# Patient Record
Sex: Male | Born: 1945 | Race: White | Hispanic: No | Marital: Married | Smoking: Former smoker
Health system: Southern US, Community
[De-identification: ages and names within clinical notes are randomized; demographics above are authoritative.]

## PROBLEM LIST (undated history)

## (undated) DIAGNOSIS — N184 Chronic kidney disease, stage 4 (severe): Secondary | ICD-10-CM

## (undated) DIAGNOSIS — G629 Polyneuropathy, unspecified: Secondary | ICD-10-CM

## (undated) DIAGNOSIS — E119 Type 2 diabetes mellitus without complications: Secondary | ICD-10-CM

## (undated) DIAGNOSIS — K2970 Gastritis, unspecified, without bleeding: Secondary | ICD-10-CM

## (undated) DIAGNOSIS — I4891 Unspecified atrial fibrillation: Secondary | ICD-10-CM

## (undated) DIAGNOSIS — N289 Disorder of kidney and ureter, unspecified: Secondary | ICD-10-CM

## (undated) DIAGNOSIS — I10 Essential (primary) hypertension: Secondary | ICD-10-CM

## (undated) DIAGNOSIS — I639 Cerebral infarction, unspecified: Secondary | ICD-10-CM

## (undated) HISTORY — PX: CHOLECYSTECTOMY: SHX55

---

## 2011-12-22 DIAGNOSIS — E1122 Type 2 diabetes mellitus with diabetic chronic kidney disease: Secondary | ICD-10-CM

## 2011-12-22 DIAGNOSIS — I10 Essential (primary) hypertension: Secondary | ICD-10-CM | POA: Diagnosis present

## 2012-10-17 ENCOUNTER — Emergency Department: Payer: Self-pay | Admitting: Emergency Medicine

## 2012-10-17 LAB — COMPREHENSIVE METABOLIC PANEL
Albumin: 3.5 g/dL (ref 3.4–5.0)
BUN: 36 mg/dL — ABNORMAL HIGH (ref 7–18)
Bilirubin,Total: 0.9 mg/dL (ref 0.2–1.0)
Creatinine: 2.17 mg/dL — ABNORMAL HIGH (ref 0.60–1.30)
EGFR (African American): 35 — ABNORMAL LOW
EGFR (Non-African Amer.): 30 — ABNORMAL LOW
Potassium: 4.2 mmol/L (ref 3.5–5.1)
SGPT (ALT): 22 U/L (ref 12–78)
Sodium: 135 mmol/L — ABNORMAL LOW (ref 136–145)
Total Protein: 7.3 g/dL (ref 6.4–8.2)

## 2012-10-17 LAB — URINALYSIS, COMPLETE
Bacteria: NONE SEEN
Glucose,UR: NEGATIVE mg/dL (ref 0–75)
Nitrite: NEGATIVE
Protein: 100
RBC,UR: <1 /HPF (ref 0–5)
Specific Gravity: 1.01 (ref 1.003–1.030)
WBC UR: <1 /HPF (ref 0–5)

## 2012-10-17 LAB — CBC
HCT: 37.7 % — ABNORMAL LOW (ref 40.0–52.0)
MCHC: 34.5 g/dL (ref 32.0–36.0)
MCV: 87 fL (ref 80–100)
Platelet: 163 10*3/uL (ref 150–440)
RBC: 4.36 10*6/uL — ABNORMAL LOW (ref 4.40–5.90)

## 2012-10-17 LAB — LIPASE, BLOOD: Lipase: 130 U/L (ref 73–393)

## 2012-10-28 ENCOUNTER — Ambulatory Visit: Payer: Self-pay | Admitting: Surgery

## 2012-10-29 ENCOUNTER — Emergency Department: Payer: Self-pay | Admitting: Emergency Medicine

## 2012-10-29 LAB — URINALYSIS, COMPLETE
Bacteria: NONE SEEN
Glucose,UR: 50 mg/dL (ref 0–75)
Ketone: NEGATIVE
Nitrite: NEGATIVE
Ph: 5 (ref 4.5–8.0)
Protein: 30
Specific Gravity: 1.008 (ref 1.003–1.030)
Squamous Epithelial: NONE SEEN

## 2012-11-01 LAB — PATHOLOGY REPORT

## 2012-11-17 ENCOUNTER — Ambulatory Visit: Payer: Self-pay | Admitting: Gastroenterology

## 2012-11-21 LAB — PATHOLOGY REPORT

## 2012-12-06 DIAGNOSIS — K2970 Gastritis, unspecified, without bleeding: Secondary | ICD-10-CM | POA: Diagnosis present

## 2013-02-02 ENCOUNTER — Ambulatory Visit: Payer: Self-pay | Admitting: Gastroenterology

## 2013-02-06 LAB — PATHOLOGY REPORT

## 2013-10-18 DIAGNOSIS — G4733 Obstructive sleep apnea (adult) (pediatric): Secondary | ICD-10-CM | POA: Diagnosis present

## 2014-07-09 ENCOUNTER — Ambulatory Visit
Admit: 2014-07-09 | Disposition: A | Discharge status: Home / Self Care | Payer: Self-pay | Attending: Internal Medicine | Admitting: Internal Medicine

## 2014-07-27 NOTE — Op Note (Signed)
PATIENT NAME:  Tyler Wiley, Tyler Wiley MR#:  [id number] DATE OF BIRTH:  1945-04-20  DATE OF PROCEDURE:  10/28/2012  PREOPERATIVE DIAGNOSES: Chronic cholecystitis, cholelithiasis, umbilical hernia.   POSTOPERATIVE DIAGNOSES: Chronic cholecystitis, cholelithiasis, umbilical hernia.   PROCEDURES: Laparoscopic cholecystectomy, umbilical hernia repair.   SURGEON: Loreli Dollar, M.D.   ANESTHESIA: General.   INDICATIONS: This 69 year old male has a history of epigastric pains, ultrasound findings of gallstones, also physical findings of an umbilical hernia which created an approximately 3 cm bulge and was partially reducible. Surgery was recommended for definitive treatment.   DESCRIPTION OF PROCEDURE: The patient was placed on the operating table in the supine position under general endotracheal anesthesia. Clippers were used, and the abdomen was prepared with ChloraPrep and draped in a sterile manner.   Initial incision was made as a supraumbilical, transversely-oriented, curvilinear incision which was carried down through the subcutaneous tissues to encounter an umbilical hernia sac. The sac was dissected free from surrounding structures and was separated from the fascial ring defect and was then inverted. Next, the fascial ring defect was grasped with a laryngeal hook and elevated. A Veress needle was inserted. Then, the needle was withdrawn, and the 10 mm port was advanced down through the sheath. The abdomen was insufflated with carbon dioxide. As the laparoscope was inserted to view the peritoneal cavity, it was noted that there was some leakage of carbon dioxide. I, therefore, removed the scope and placed two 0 Maxon simple sutures to narrow the fascial ring defect, and subsequently, the procedure was continued with the laparoscopy and had an airtight seal. The liver appeared normal. The patient was moderately obese. There was a large amount of omentum. Another incision was made in the epigastrium just  slightly to the right of the midline to introduce an 11 mm cannula. Two incisions were made in the lateral aspect of the right upper quadrant to introduce two 5 mm cannulas.   The gallbladder was retracted towards the right shoulder. There was a moderate amount of fatty deposits surrounding the gallbladder neck. An incision was made in these fatty deposits and identified the pouch of Morison which was retracted inferiorly and laterally. The gallbladder neck was further mobilized with incision of the visceral peritoneum. The infundibulum was followed down to the cystic duct. The neck of the gallbladder was further mobilized. There was a branch of the cystic artery which was controlled with hemoclips and divided. The location of the porta hepatis was demonstrated. Next, an Endo Clip was placed across the cystic duct adjacent to the neck of the gallbladder. An incision was made in the cystic duct to introduce a Reddick catheter; however, the Reddick catheter would only thread in approximately 8 mm and, therefore, did not do a cholangiogram. I did milk the cystic duct to see if there were any stones in it but did not identify any stones. Subsequently, the cystic duct was doubly ligated with Endo Clips and divided. Further dissection was carried out identifying the main trunk of the cystic artery which was controlled with double Endo Clips and divided. The gallbladder was dissected free from the liver with hook and cautery. This was a somewhat tedious dissection due to the large size of the gallbladder and completely was separated. The site was irrigated with heparinized saline solution and aspirated. Hemostasis was intact. The gallbladder was delivered out through the umbilical incision. The two sutures placed in the fascia were cut. The gallbladder was opened and suctioned. Next, I noted there was a  large amount of stones in the gallbladder. These were removed with the stone scoop and also with stone forceps. This  was a somewhat tedious undertaking removing stones which lasted some additional 10 to 15 minutes removing all of the stones, and subsequently, the gallbladder could be removed and was submitted in formalin with stones for routine pathology. The right upper quadrant was further inspected. Hemostasis was intact. The cannulas were removed, allowing carbon dioxide to escape from the peritoneal cavity.   Next, the umbilical hernia repair was carried out by inverting the sac which had previously been dissected, and the fascial ring defect was closed with a transversely-oriented suture line of interrupted 0 Maxon figure-of-eight sutures. The skin of the umbilicus was sutured to the deep fascia with 3-0 chromic. The skin at this site was closed with 3-0 chromic subcuticular sutures, the epigastric incision closed with 3-0 chromic subcuticular sutures and the two 5 mm port sites closed with 5-0 chromic subcuticular sutures. All wounds were then dressed with benzoin and Steri-Strips. Gauze was rolled up into a ball to place into the navel, and all dressings were then covered with gauze and 2 inch paper tape.   The patient tolerated the procedure satisfactorily and was then prepared for transfer to the recovery room.    ____________________________ Lenna Sciara. Rochel Brome, MD jws:gb D: 10/28/2012 15:27:52 ET T: 10/28/2012 22:22:41 ET JOB#: [id number]  cc: Loreli Dollar, MD, <Dictator> Loreli Dollar MD ELECTRONICALLY SIGNED 10/29/2012 13:19

## 2015-08-28 ENCOUNTER — Other Ambulatory Visit: Payer: Self-pay | Admitting: Internal Medicine

## 2015-08-28 DIAGNOSIS — N289 Disorder of kidney and ureter, unspecified: Secondary | ICD-10-CM

## 2015-08-30 ENCOUNTER — Ambulatory Visit
Admission: RE | Admit: 2015-08-30 | Discharge: 2015-08-30 | Disposition: A | Discharge status: Home / Self Care | Payer: Medicare Other | Source: Ambulatory Visit | Attending: Internal Medicine | Admitting: Internal Medicine

## 2015-08-30 DIAGNOSIS — N289 Disorder of kidney and ureter, unspecified: Secondary | ICD-10-CM | POA: Diagnosis present

## 2015-08-30 DIAGNOSIS — N281 Cyst of kidney, acquired: Secondary | ICD-10-CM | POA: Insufficient documentation

## 2018-02-14 ENCOUNTER — Emergency Department: Payer: Medicare Other

## 2018-02-14 ENCOUNTER — Inpatient Hospital Stay
Admission: EM | Admit: 2018-02-14 | Discharge: 2018-02-15 | DRG: 641 | Disposition: A | Discharge status: Home / Self Care | Payer: Medicare Other | Attending: Internal Medicine | Admitting: Internal Medicine

## 2018-02-14 ENCOUNTER — Other Ambulatory Visit: Payer: Self-pay

## 2018-02-14 ENCOUNTER — Other Ambulatory Visit
Admission: RE | Admit: 2018-02-14 | Discharge: 2018-02-14 | Disposition: A | Discharge status: Home / Self Care | Payer: Medicare Other | Source: Ambulatory Visit | Attending: Physician Assistant | Admitting: Physician Assistant

## 2018-02-14 ENCOUNTER — Encounter: Payer: Self-pay | Admitting: Emergency Medicine

## 2018-02-14 DIAGNOSIS — D631 Anemia in chronic kidney disease: Secondary | ICD-10-CM | POA: Diagnosis present

## 2018-02-14 DIAGNOSIS — Z8673 Personal history of transient ischemic attack (TIA), and cerebral infarction without residual deficits: Secondary | ICD-10-CM | POA: Diagnosis not present

## 2018-02-14 DIAGNOSIS — Z7984 Long term (current) use of oral hypoglycemic drugs: Secondary | ICD-10-CM | POA: Diagnosis not present

## 2018-02-14 DIAGNOSIS — E875 Hyperkalemia: Principal | ICD-10-CM | POA: Diagnosis present

## 2018-02-14 DIAGNOSIS — Z7902 Long term (current) use of antithrombotics/antiplatelets: Secondary | ICD-10-CM

## 2018-02-14 DIAGNOSIS — Z87891 Personal history of nicotine dependence: Secondary | ICD-10-CM | POA: Diagnosis not present

## 2018-02-14 DIAGNOSIS — Z79899 Other long term (current) drug therapy: Secondary | ICD-10-CM | POA: Diagnosis not present

## 2018-02-14 DIAGNOSIS — Z8249 Family history of ischemic heart disease and other diseases of the circulatory system: Secondary | ICD-10-CM | POA: Diagnosis not present

## 2018-02-14 DIAGNOSIS — N179 Acute kidney failure, unspecified: Secondary | ICD-10-CM | POA: Diagnosis present

## 2018-02-14 DIAGNOSIS — Z794 Long term (current) use of insulin: Secondary | ICD-10-CM | POA: Diagnosis not present

## 2018-02-14 DIAGNOSIS — N189 Chronic kidney disease, unspecified: Secondary | ICD-10-CM

## 2018-02-14 DIAGNOSIS — Z9049 Acquired absence of other specified parts of digestive tract: Secondary | ICD-10-CM | POA: Diagnosis not present

## 2018-02-14 DIAGNOSIS — I129 Hypertensive chronic kidney disease with stage 1 through stage 4 chronic kidney disease, or unspecified chronic kidney disease: Secondary | ICD-10-CM | POA: Diagnosis present

## 2018-02-14 DIAGNOSIS — R0602 Shortness of breath: Secondary | ICD-10-CM | POA: Insufficient documentation

## 2018-02-14 DIAGNOSIS — N3289 Other specified disorders of bladder: Secondary | ICD-10-CM | POA: Diagnosis present

## 2018-02-14 DIAGNOSIS — N289 Disorder of kidney and ureter, unspecified: Secondary | ICD-10-CM

## 2018-02-14 DIAGNOSIS — E1122 Type 2 diabetes mellitus with diabetic chronic kidney disease: Secondary | ICD-10-CM | POA: Diagnosis present

## 2018-02-14 DIAGNOSIS — N184 Chronic kidney disease, stage 4 (severe): Secondary | ICD-10-CM | POA: Diagnosis present

## 2018-02-14 DIAGNOSIS — R001 Bradycardia, unspecified: Secondary | ICD-10-CM

## 2018-02-14 DIAGNOSIS — Z7951 Long term (current) use of inhaled steroids: Secondary | ICD-10-CM

## 2018-02-14 DIAGNOSIS — R197 Diarrhea, unspecified: Secondary | ICD-10-CM | POA: Diagnosis not present

## 2018-02-14 DIAGNOSIS — N2581 Secondary hyperparathyroidism of renal origin: Secondary | ICD-10-CM | POA: Diagnosis not present

## 2018-02-14 HISTORY — DX: Type 2 diabetes mellitus without complications: E11.9

## 2018-02-14 HISTORY — DX: Essential (primary) hypertension: I10

## 2018-02-14 HISTORY — DX: Disorder of kidney and ureter, unspecified: N28.9

## 2018-02-14 HISTORY — DX: Cerebral infarction, unspecified: I63.9

## 2018-02-14 HISTORY — DX: Gastritis, unspecified, without bleeding: K29.70

## 2018-02-14 LAB — CBC
HCT: 36.4 % — ABNORMAL LOW (ref 39.0–52.0)
Hemoglobin: 12.1 g/dL — ABNORMAL LOW (ref 13.0–17.0)
MCH: 29.6 pg (ref 26.0–34.0)
MCHC: 33.2 g/dL (ref 30.0–36.0)
MCV: 89 fL (ref 80.0–100.0)
NRBC: 0 % (ref 0.0–0.2)
PLATELETS: 164 10*3/uL (ref 150–400)
RBC: 4.09 MIL/uL — ABNORMAL LOW (ref 4.22–5.81)
RDW: 13.3 % (ref 11.5–15.5)
WBC: 9.1 10*3/uL (ref 4.0–10.5)

## 2018-02-14 LAB — URINALYSIS, COMPLETE (UACMP) WITH MICROSCOPIC
BACTERIA UA: NONE SEEN
Bilirubin Urine: NEGATIVE
Glucose, UA: 150 mg/dL — AB
Hgb urine dipstick: NEGATIVE
Ketones, ur: NEGATIVE mg/dL
Leukocytes, UA: NEGATIVE
NITRITE: NEGATIVE
Specific Gravity, Urine: 1.013 (ref 1.005–1.030)
pH: 6 (ref 5.0–8.0)

## 2018-02-14 LAB — TROPONIN I
TROPONIN I: 0.04 ng/mL — AB (ref <0.03)
Troponin I: 0.16 ng/mL (ref <0.03)

## 2018-02-14 LAB — BASIC METABOLIC PANEL
ANION GAP: 8 (ref 5–15)
BUN: 35 mg/dL — AB (ref 8–23)
CALCIUM: 8.6 mg/dL — AB (ref 8.9–10.3)
CHLORIDE: 103 mmol/L (ref 98–111)
CO2: 24 mmol/L (ref 22–32)
CREATININE: 3.97 mg/dL — AB (ref 0.61–1.24)
GFR calc Af Amer: 16 mL/min — ABNORMAL LOW (ref >60)
GFR, EST NON AFRICAN AMERICAN: 14 mL/min — AB (ref >60)
GLUCOSE: 234 mg/dL — AB (ref 70–99)
POTASSIUM: 6 mmol/L — AB (ref 3.5–5.1)
Sodium: 135 mmol/L (ref 135–145)

## 2018-02-14 LAB — GLUCOSE, CAPILLARY: GLUCOSE-CAPILLARY: 119 mg/dL — AB (ref 70–99)

## 2018-02-14 MED ORDER — LINAGLIPTIN 5 MG PO TABS
5.0000 mg | ORAL_TABLET | Freq: Every day | ORAL | Status: DC
  Administered 2018-02-15: 5 mg via ORAL
  Filled 2018-02-14: qty 1

## 2018-02-14 MED ORDER — HEPARIN SODIUM (PORCINE) 5000 UNIT/ML IJ SOLN
5000.0000 [IU] | Freq: Three times a day (TID) | INTRAMUSCULAR | Status: DC
  Administered 2018-02-14 – 2018-02-15 (×2): 5000 [IU] via SUBCUTANEOUS
  Filled 2018-02-14 (×2): qty 1

## 2018-02-14 MED ORDER — AMLODIPINE BESYLATE 10 MG PO TABS
10.0000 mg | ORAL_TABLET | Freq: Every day | ORAL | Status: DC
  Administered 2018-02-14 – 2018-02-15 (×2): 10 mg via ORAL
  Filled 2018-02-14 (×2): qty 1

## 2018-02-14 MED ORDER — CALCIUM GLUCONATE-NACL 1-0.675 GM/50ML-% IV SOLN
1.0000 g | Freq: Once | INTRAVENOUS | Status: AC
  Administered 2018-02-14: 1000 mg via INTRAVENOUS
  Filled 2018-02-14: qty 50

## 2018-02-14 MED ORDER — DOCUSATE SODIUM 100 MG PO CAPS: 100.0000 mg | ORAL_CAPSULE | Freq: Two times a day (BID) | ORAL | Status: DC | PRN

## 2018-02-14 MED ORDER — CLOPIDOGREL BISULFATE 75 MG PO TABS
75.0000 mg | ORAL_TABLET | Freq: Every day | ORAL | Status: DC
Start: 2018-02-14 — End: 2018-02-15
  Administered 2018-02-14: 75 mg via ORAL
  Filled 2018-02-14: qty 1

## 2018-02-14 MED ORDER — PATIROMER SORBITEX CALCIUM 8.4 G PO PACK
16.8000 g | PACK | Freq: Every day | ORAL | Status: DC
  Administered 2018-02-14: 16.8 g via ORAL
  Filled 2018-02-14: qty 2

## 2018-02-14 MED ORDER — SODIUM CHLORIDE 0.9 % IV SOLN
INTRAVENOUS | Status: DC
  Administered 2018-02-14 (×2): via INTRAVENOUS

## 2018-02-14 MED ORDER — SODIUM CHLORIDE 0.9 % IV BOLUS
1000.0000 mL | Freq: Once | INTRAVENOUS | Status: AC
Start: 2018-02-14 — End: 2018-02-14
  Administered 2018-02-14: 1000 mL via INTRAVENOUS

## 2018-02-14 MED ORDER — HYDRALAZINE HCL 20 MG/ML IJ SOLN: 10.0000 mg | Freq: Four times a day (QID) | INTRAMUSCULAR | Status: DC | PRN

## 2018-02-14 MED ORDER — SODIUM ZIRCONIUM CYCLOSILICATE 10 G PO PACK
10.0000 g | PACK | Freq: Two times a day (BID) | ORAL | Status: DC
  Administered 2018-02-14 – 2018-02-15 (×2): 10 g via ORAL
  Filled 2018-02-14 (×3): qty 1

## 2018-02-14 MED ORDER — GLIMEPIRIDE 2 MG PO TABS
2.0000 mg | ORAL_TABLET | Freq: Every day | ORAL | Status: DC
  Administered 2018-02-15: 2 mg via ORAL
  Filled 2018-02-14: qty 1

## 2018-02-14 MED ORDER — INSULIN ASPART 100 UNIT/ML ~~LOC~~ SOLN: 0.0000 [IU] | Freq: Three times a day (TID) | SUBCUTANEOUS | Status: DC

## 2018-02-14 MED ORDER — FLUTICASONE PROPIONATE 50 MCG/ACT NA SUSP
2.0000 | Freq: Every day | NASAL | Status: DC | PRN
  Filled 2018-02-14: qty 16

## 2018-02-14 MED ORDER — TORSEMIDE 20 MG PO TABS
10.0000 mg | ORAL_TABLET | Freq: Every day | ORAL | Status: DC
Start: 2018-02-14 — End: 2018-02-15
  Administered 2018-02-15: 10 mg via ORAL
  Filled 2018-02-14: qty 1

## 2018-02-14 NOTE — Progress Notes (Signed)
Family Meeting Note  Advance Directive:yes  Today a meeting took place with the Patient and spouse.  The following clinical team members were present during this meeting:MD  The following were discussed:Patient's diagnosis: Chronic kidney disease stage IV, hypertension, diabetes, history of stroke, Patient's progosis: Unable to determine and Goals for treatment: Full Code  Additional follow-up to be provided: Nephrology.  Time spent during discussion:20 minutes  Tyler Basta, MD

## 2018-02-14 NOTE — ED Notes (Signed)
Pt states this AM he was doing routine work and became short of breath without exerstion. Pt denies and chest pain with SOB and states he felt lightheaded as well. Pt denies any pain or Sob at this time.

## 2018-02-14 NOTE — ED Provider Notes (Signed)
[facility] Emergency Department Provider Note  Time seen: 4:49 PM  I have reviewed the triage vital signs and the nursing notes.   HISTORY  Chief Complaint Shortness of Breath    HPI Tyler Wiley is a 72 y.o. male with a past medical history of diabetes, gastritis, hypertension, CVA, presents to the emergency department for shortness of breath.  According to the patient since around 8:00 this morning he has been feeling somewhat short of breath, went to the walk-in clinic had lab work performed and was referred for a stress test next week.  Patient states he was called back today saying that his heart enzymes were elevated and that he needed to go to the emergency department.  Patient denies any chest pain, states his shortness of breath has improved and is now very minimal.  Denies any cough or congestion.  Largely negative review of systems.   Past Medical History:  Diagnosis Date  . Diabetes mellitus without complication ([place])   . Gastritis   . Hypertension   . Renal disorder   . Stroke [facility])     There are no active problems to display for this patient.   Past Surgical History:  Procedure Laterality Date  . CHOLECYSTECTOMY      Prior to Admission medications   Not on File    No Known Allergies  No family history on file.  Social History Social History   Tobacco Use  . Smoking status: Former Research scientist (life sciences)  . Smokeless tobacco: Never Used  Substance Use Topics  . Alcohol use: Yes    Comment: Social   . Drug use: Never    Review of Systems Constitutional: Negative for fever. Cardiovascular: Negative for chest pain. Respiratory: Mild shortness of breath since 8 AM this morning. Gastrointestinal: Negative for abdominal pain, vomiting.  Negative for diarrhea. Genitourinary: Negative for urinary compaints Musculoskeletal: Negative for leg pain or swelling. Skin: Negative for skin complaints  Neurological: Negative for headache All other ROS  negative  ____________________________________________   PHYSICAL EXAM:  Constitutional: Alert and oriented. Well appearing and in no distress. Eyes: Normal exam ENT   Head: Normocephalic and atraumatic.   Mouth/Throat: Mucous membranes are moist. Cardiovascular: Normal rate, regular rhythm. No murmur Respiratory: Normal respiratory effort without tachypnea nor retractions. Breath sounds are clear  Gastrointestinal: Soft and nontender. No distention.  Musculoskeletal: Nontender with normal range of motion in all extremities.  Neurologic:  Normal speech and language. No gross focal neurologic deficits  Skin:  Skin is warm, dry and intact.  Psychiatric: Mood and affect are normal.   ____________________________________________    EKG  EKG reviewed and interpreted by myself shows sinus bradycardia 53 bpm with a narrow QRS, normal axis, normal intervals, no concerning ST changes.  ____________________________________________    RADIOLOGY  Chest x-ray shows minimal atelectasis  ____________________________________________   INITIAL IMPRESSION / ASSESSMENT AND PLAN / ED COURSE  Pertinent labs & imaging results that were available during my care of the patient were reviewed by me and considered in my medical decision making (see chart for details).  Patient presents to the emergency department with an elevated troponin after being seen at the walk-in clinic for shortness of breath this morning.  Patient is labs repeated in the emergency department show a minimally elevated troponin 0.04 however more concerningly the patient's creatinine has increased from 3.4 in July to now 3.97, potassium has increased from 4.5 to currently 6.0.  Patient denies any chest pain denies any shortness  of breath.  EKG is reassuring without any changes identified.  Given the patient's shortness of breath with lab abnormalities I discussed the patient with nephrology, they recommend IV hydration  admitting the patient to the hospitalist service to monitor the potassium and renal function.  Patient is agreeable to this plan of care.  ____________________________________________   FINAL CLINICAL IMPRESSION(S) / ED DIAGNOSES  Hyperkalemia Dyspnea Acute on chronic renal insufficiency    Harvest Dark, MD 02/14/18 1653

## 2018-02-14 NOTE — Progress Notes (Signed)
[facility]  ROUNDING NOTE   Subjective:  Patient well-known to [place]. We follow him for chronic kidney disease stage IV. His baseline EGFR appears to be 18. He also presents with significant hyperkalemia with a serum potassium of 6.7. He did have increasing diarrhea over the weekend.    Objective:  Vital signs in last 24 hours:  Temp:  [97.9 F (36.6 C)] 97.9 F (36.6 C) (11/11 1712) Pulse Rate:  [47-57] 52 (11/11 1730) Resp:  [16-23] 16 (11/11 1730) BP: (178-188)/(83-87) 188/87 (11/11 1730) SpO2:  [96 %-97 %] 97 % (11/11 1730)  Weight change:  There were no vitals filed for this visit.  Intake/Output: No intake/output data recorded.   Intake/Output this shift:  Total I/O In: 1000 [IV Piggyback:1000] Out: -   Physical Exam: General: No acute distress  Head: Normocephalic, atraumatic. Moist oral mucosal membranes  Eyes: Anicteric  Neck: Supple, trachea midline  Lungs:  Clear to auscultation, normal effort  Heart: S1S2 no rubs  Abdomen:  Soft, nontender, bowel sounds present  Extremities: 1+ peripheral edema.  Neurologic: Awake, alert, following commands  Skin: No lesions       Basic Metabolic Panel: Recent Labs  Lab 02/14/18 1337  NA 135  K 6.0*  CL 103  CO2 24  GLUCOSE 234*  BUN 35*  CREATININE 3.97*  CALCIUM 8.6*    Liver Function Tests: No results for input(s): AST, ALT, ALKPHOS, BILITOT, PROT, ALBUMIN in the last 168 hours. No results for input(s): LIPASE, AMYLASE in the last 168 hours. No results for input(s): AMMONIA in the last 168 hours.  CBC: Recent Labs  Lab 02/14/18 1337  WBC 9.1  HGB 12.1*  HCT 36.4*  MCV 89.0  PLT 164    Cardiac Enzymes: Recent Labs  Lab 02/14/18 1122 02/14/18 1337  TROPONINI 0.16* 0.04*    BNP: Invalid input(s): POCBNP  CBG: No results for input(s): GLUCAP in the last 168 hours.  Microbiology: No results found for this or any previous visit.  Coagulation Studies: No results for  input(s): LABPROT, INR in the last 72 hours.  Urinalysis: No results for input(s): COLORURINE, LABSPEC, PHURINE, GLUCOSEU, HGBUR, BILIRUBINUR, KETONESUR, PROTEINUR, UROBILINOGEN, NITRITE, LEUKOCYTESUR in the last 72 hours.  Invalid input(s): APPERANCEUR    Imaging: Dg Chest 2 View  Result Date: 02/14/2018 CLINICAL DATA:  Shortness of breath. Chest pain. EXAM: CHEST - 2 VIEW COMPARISON:  10/17/2012 FINDINGS: The cardiomediastinal silhouette is within normal limits. Minimal bibasilar opacities likely represent atelectasis. No edema, pleural effusion, or pneumothorax is identified. No acute osseous abnormality is seen. IMPRESSION: Minimal bibasilar atelectasis. Electronically Signed   By: Logan Bores M.D.   On: 02/14/2018 14:20     Medications:   . calcium gluconate in NaCl     . [START ON 02/15/2018] insulin aspart  0-9 Units Subcutaneous TID WC  . patiromer  16.8 g Oral Daily     Assessment/ Plan:  72 y.o. male past medical history of chronic kidney disease stage IV baseline EGFR 18, hypertension, prior history of CVA, anemia of chronic kidney disease, hyperkalemia, secondary hyperparathyroidism who presents now with shortness of breath and elevated blood pressure.  1.  Acute renal failure/chronic kidney disease stage IV baseline EGFR 18/Diabetes melltius type 2 with CKD.  EGFR currently down to 16.  Patient also has hyperkalemia.  Patient has been provided IV fluid hydration in the emergency department.  Continue 0.9 normal saline at 50 cc/h.  Avoid ACE inhibitor or ARB for now.  No  urgent indication for dialysis however may need to consider this if potassium continues to rise.  2.  Hyperkalemia.  Serum potassium currently 6.0.  Patient has been administered but tremors 16.8 g p.o. daily.  3.  Anemia of chronic kidney disease but hemoglobin currently 12.1.  No indication for Procrit.  Continue to periodically monitor CBC.   LOS: 0 Tyler Wiley 11/11/20196:49 PM

## 2018-02-14 NOTE — H&P (Signed)
[facility] NAME: Tyler Wiley    MR#:  [id number]  DATE OF BIRTH:  May 03, 1945  DATE OF ADMISSION:  02/14/2018  PRIMARY CARE PHYSICIAN: Kirk Ruths, MD   REQUESTING/REFERRING PHYSICIAN: [email or web address]  CHIEF COMPLAINT:   Chief Complaint  Patient presents with  . Shortness of Breath    HISTORY OF PRESENT ILLNESS: Tyler Wiley  is a 72 y.o. male with a known history of diabetes without complication, hypertension, chronic kidney disease, stroke-had some shortness of breath and went to his doctor's office where they did some blood work and noted troponin slightly higher.  They were worried that his shortness of breath could be due to his heart issues and advised to go to emergency room. His troponin was noted to be normal in the emergency room but his potassium was very high, so given to admit.  PAST MEDICAL HISTORY:   Past Medical History:  Diagnosis Date  . Diabetes mellitus without complication ([place])   . Gastritis   . Hypertension   . Renal disorder   . Stroke [facility])     PAST SURGICAL HISTORY:  Past Surgical History:  Procedure Laterality Date  . CHOLECYSTECTOMY      SOCIAL HISTORY:  Social History   Tobacco Use  . Smoking status: Former Research scientist (life sciences)  . Smokeless tobacco: Never Used  Substance Use Topics  . Alcohol use: Yes    Comment: Social     FAMILY HISTORY:  Family History  Problem Relation Age of Onset  . CAD Father     DRUG ALLERGIES: No Known Allergies  REVIEW OF SYSTEMS:   CONSTITUTIONAL: No fever, fatigue or weakness.  EYES: No blurred or double vision.  EARS, NOSE, AND THROAT: No tinnitus or ear pain.  RESPIRATORY: No cough, have shortness of breath,no wheezing or hemoptysis.  CARDIOVASCULAR: No chest pain, orthopnea, edema.  GASTROINTESTINAL: No nausea, vomiting, diarrhea or abdominal pain.  GENITOURINARY: No dysuria, hematuria.  ENDOCRINE: No polyuria, nocturia,  HEMATOLOGY: No anemia, easy  bruising or bleeding SKIN: No rash or lesion. MUSCULOSKELETAL: No joint pain or arthritis.   NEUROLOGIC: No tingling, numbness, weakness.  PSYCHIATRY: No anxiety or depression.   MEDICATIONS AT HOME:  Prior to Admission medications   Medication Sig Start Date End Date Taking? Authorizing Provider  carvedilol (COREG) 12.5 MG tablet Take 12.5 mg by mouth 2 (two) times daily with a meal.   Yes [provider]  clopidogrel (PLAVIX) 75 MG tablet Take 75 mg by mouth daily.   Yes [provider]  fluticasone (FLONASE) 50 MCG/ACT nasal spray Place 2 sprays into both nostrils daily as needed for allergies or rhinitis.    Yes [provider]  glimepiride (AMARYL) 2 MG tablet Take 2 mg by mouth daily with breakfast.   Yes [provider]  sitaGLIPtin (JANUVIA) 100 MG tablet Take 100 mg by mouth daily.   Yes [provider]  torsemide (DEMADEX) 10 MG tablet Take 10 mg by mouth daily.   Yes [provider]      PHYSICAL EXAMINATION:   VITAL SIGNS: Blood pressure (!) 188/87, pulse (!) 52, temperature 97.9 F (36.6 C), temperature source Oral, resp. rate 16, SpO2 97 %.  GENERAL:  72 y.o.-year-old patient lying in the bed with no acute distress.  EYES: Pupils equal, round, reactive to light and accommodation. No scleral icterus. Extraocular muscles intact.  HEENT: Head atraumatic, normocephalic. Oropharynx and nasopharynx clear.  NECK:  Supple, no jugular venous  distention. No thyroid enlargement, no tenderness.  LUNGS: Normal breath sounds bilaterally, no wheezing, rales,rhonchi or crepitation. No use of accessory muscles of respiration.  CARDIOVASCULAR: S1, S2 normal. No murmurs, rubs, or gallops.  ABDOMEN: Soft, nontender, nondistended. Bowel sounds present. No organomegaly or mass.  EXTREMITIES: No pedal edema, cyanosis, or clubbing.  NEUROLOGIC: Cranial nerves II through XII are intact. Muscle strength 5/5 in all extremities. Sensation intact.  Gait not checked.  PSYCHIATRIC: The patient is alert and oriented x 3.  SKIN: No obvious rash, lesion, or ulcer.   LABORATORY PANEL:   CBC Recent Labs  Lab 02/14/18 1337  WBC 9.1  HGB 12.1*  HCT 36.4*  PLT 164  MCV 89.0  MCH 29.6  MCHC 33.2  RDW 13.3   ------------------------------------------------------------------------------------------------------------------  Chemistries  Recent Labs  Lab 02/14/18 1337  NA 135  K 6.0*  CL 103  CO2 24  GLUCOSE 234*  BUN 35*  CREATININE 3.97*  CALCIUM 8.6*   ------------------------------------------------------------------------------------------------------------------ CrCl cannot be calculated (Unknown ideal weight.). ------------------------------------------------------------------------------------------------------------------ No results for input(s): TSH, T4TOTAL, T3FREE, THYROIDAB in the last 72 hours.  Invalid input(s): FREET3   Coagulation profile No results for input(s): INR, PROTIME in the last 168 hours. ------------------------------------------------------------------------------------------------------------------- No results for input(s): DDIMER in the last 72 hours. -------------------------------------------------------------------------------------------------------------------  Cardiac Enzymes Recent Labs  Lab 02/14/18 1122 02/14/18 1337  TROPONINI 0.16* 0.04*   ------------------------------------------------------------------------------------------------------------------ Invalid input(s): POCBNP  ---------------------------------------------------------------------------------------------------------------  Urinalysis    Component Value Date/Time   COLORURINE Straw 10/29/2012 0244   APPEARANCEUR Clear 10/29/2012 0244   LABSPEC 1.008 10/29/2012 0244   PHURINE 5.0 10/29/2012 0244   GLUCOSEU 50 mg/dL 10/29/2012 0244   HGBUR 1+ 10/29/2012 0244   BILIRUBINUR Negative 10/29/2012 0244    KETONESUR Negative 10/29/2012 0244   PROTEINUR 30 mg/dL 10/29/2012 0244   NITRITE Negative 10/29/2012 0244   LEUKOCYTESUR Negative 10/29/2012 0244     RADIOLOGY: Dg Chest 2 View  Result Date: 02/14/2018 CLINICAL DATA:  Shortness of breath. Chest pain. EXAM: CHEST - 2 VIEW COMPARISON:  10/17/2012 FINDINGS: The cardiomediastinal silhouette is within normal limits. Minimal bibasilar opacities likely represent atelectasis. No edema, pleural effusion, or pneumothorax is identified. No acute osseous abnormality is seen. IMPRESSION: Minimal bibasilar atelectasis. Electronically Signed   By: Logan Bores M.D.   On: 02/14/2018 14:20    EKG: Orders placed or performed during the hospital encounter of 02/14/18  . ED EKG within 10 minutes  . ED EKG within 10 minutes    IMPRESSION AND PLAN:  *Hyperkalemia  Give calcium Gluconate, no EKG changes. We will give lokelma and check tomorrow.  *Chronic kidney disease stage IV Appears at baseline continue to monitor.  *Diabetes Continue home medications and keep on sliding scale coverage.  *Hypertension, bradycardia Due to bradycardia -  I will stop his carvedilol and start amlodipine.    *History of stroke Continue Plavix.   All the records are reviewed and case discussed with ED provider. Management plans discussed with the patient, family and they are in agreement.  CODE STATUS: Full code  Discussed with patient and his wife in the room.  TOTAL TIME TAKING CARE OF THIS PATIENT: 45 minutes.    Vaughan Basta M.D on 02/14/2018   Between 7am to 6pm - Pager - [phone]  After 6pm go to [email or web address] - password [email or web address] [facility]  Office  [phone]  CC: Primary care physician; Kirk Ruths, MD   Note: This dictation was prepared with Dragon dictation along with  smaller phrase technology. Any transcriptional errors that result from this process are unintentional.

## 2018-02-14 NOTE — ED Triage Notes (Signed)
Patient presents to ED via POV from home with c/o shortness of breath. Patient was seen at this PCP office earlier today and had blood work completed, patient was called and told that he needed to come in because his blood work indicated that he had a heart attack. Patient ambulatory on arrival denies SOB at rest and chest pain.

## 2018-02-15 ENCOUNTER — Inpatient Hospital Stay: Payer: Medicare Other

## 2018-02-15 DIAGNOSIS — E875 Hyperkalemia: Secondary | ICD-10-CM | POA: Diagnosis not present

## 2018-02-15 LAB — CBC
HEMATOCRIT: 30.8 % — AB (ref 39.0–52.0)
Hemoglobin: 10.1 g/dL — ABNORMAL LOW (ref 13.0–17.0)
MCH: 29.6 pg (ref 26.0–34.0)
MCHC: 32.8 g/dL (ref 30.0–36.0)
MCV: 90.3 fL (ref 80.0–100.0)
Platelets: 138 10*3/uL — ABNORMAL LOW (ref 150–400)
RBC: 3.41 MIL/uL — ABNORMAL LOW (ref 4.22–5.81)
RDW: 13.2 % (ref 11.5–15.5)
WBC: 8.2 10*3/uL (ref 4.0–10.5)
nRBC: 0 % (ref 0.0–0.2)

## 2018-02-15 LAB — BASIC METABOLIC PANEL
Anion gap: 4 — ABNORMAL LOW (ref 5–15)
BUN: 36 mg/dL — AB (ref 8–23)
CHLORIDE: 114 mmol/L — AB (ref 98–111)
CO2: 22 mmol/L (ref 22–32)
CREATININE: 3.77 mg/dL — AB (ref 0.61–1.24)
Calcium: 8.7 mg/dL — ABNORMAL LOW (ref 8.9–10.3)
GFR calc Af Amer: 17 mL/min — ABNORMAL LOW (ref >60)
GFR calc non Af Amer: 15 mL/min — ABNORMAL LOW (ref >60)
GLUCOSE: 74 mg/dL (ref 70–99)
Potassium: 5 mmol/L (ref 3.5–5.1)
SODIUM: 140 mmol/L (ref 135–145)

## 2018-02-15 LAB — GLUCOSE, CAPILLARY
GLUCOSE-CAPILLARY: 93 mg/dL (ref 70–99)
Glucose-Capillary: 92 mg/dL (ref 70–99)

## 2018-02-15 MED ORDER — SODIUM ZIRCONIUM CYCLOSILICATE 10 G PO PACK: 10.0000 g | PACK | Freq: Every day | ORAL | 0 refills | Status: DC

## 2018-02-15 NOTE — Progress Notes (Signed)
To ultrasound via bed 

## 2018-02-15 NOTE — Progress Notes (Signed)
Pt discharged to home via wc.  Instructions givenn to pt.  Questions answered.  No distress.

## 2018-02-15 NOTE — Discharge Summary (Signed)
[place] [facility] [place] NAME: Tyler Wiley    MR#:  [id number]  DATE OF BIRTH:  1945/09/17  DATE OF ADMISSION:  02/14/2018 ADMITTING PHYSICIAN: Tyler Basta, MD  DATE OF DISCHARGE: 02/15/2018  PRIMARY CARE PHYSICIAN: Tyler Ruths, MD    ADMISSION DIAGNOSIS:  Hyperkalemia [E87.5] SOB (shortness of breath) [R06.02] Acute on chronic renal insufficiency [N28.9, N18.9] Acute on chronic renal failure (HCC) [N17.9, N18.9]  DISCHARGE DIAGNOSIS:  acute hyperkalemia improved CKD stage IV  SECONDARY DIAGNOSIS:   Past Medical History:  Diagnosis Date  . Diabetes mellitus without complication ([place])   . Gastritis   . Hypertension   . Renal disorder   . Stroke [facility])     HOSPITAL COURSE:  Tyler Wiley  is a 72 y.o. male with a known history of diabetes without complication, hypertension, chronic kidney disease, stroke-had some shortness of breath and went to his doctor's office where they did some blood work and noted troponin slightly higher.  They were worried that his shortness of breath could be due to his heart issues and advised to go to emergency room. His troponin was noted to be normal in the emergency room but his potassium was high.  * Acute Hyperkalemia  Give calcium Gluconate, no EKG changes. recieved lokelma  bid--change to daily for 2 weeks a[er Dr Elwyn Lade recs K 5.0 Came in with K of 6.0 No acute EKG changes  *Chronic kidney disease stage IV Appears at baseline continue to monitor. Seen by nephrology. F/u dr Candiss Norse as outpt  *Diabetes Continue home medications and keep on sliding scale coverage.  *Hypertension, bradycardia chronic asymptomatic  -cont cardiac meds  -pt has no history of cardiac disease. He had some shortness of breath yesterday which resolved in a few minutes. He is asymptomatic. Cardiac enzymes on admission was .04    *History of stroke Continue Plavix.  Patient feels back to  baseline. Pt Wants to go home. Patient's wife is a retired [employer] scientist and feels patient is back to baseline and okay to go home with outpatient follow-up with nephrology and PCP  CONSULTS OBTAINED:  Treatment Team:  Anthonette Legato, MD  DRUG ALLERGIES:  No Known Allergies  DISCHARGE MEDICATIONS:   Allergies as of 02/15/2018   No Known Allergies     Medication List    TAKE these medications   carvedilol 12.5 MG tablet Commonly known as:  COREG Take 12.5 mg by mouth 2 (two) times daily with a meal.   clopidogrel 75 MG tablet Commonly known as:  PLAVIX Take 75 mg by mouth daily.   fluticasone 50 MCG/ACT nasal spray Commonly known as:  FLONASE Place 2 sprays into both nostrils daily as needed for allergies or rhinitis.   glimepiride 2 MG tablet Commonly known as:  AMARYL Take 2 mg by mouth daily with breakfast.   sitaGLIPtin 100 MG tablet Commonly known as:  JANUVIA Take 100 mg by mouth daily.   sodium zirconium cyclosilicate 10 g Pack packet Commonly known as:  LOKELMA Take 10 g by mouth daily.   torsemide 10 MG tablet Commonly known as:  DEMADEX Take 10 mg by mouth daily.       If you experience worsening of your admission symptoms, develop shortness of breath, life threatening emergency, suicidal or homicidal thoughts you must seek medical attention immediately by calling 911 or calling your MD immediately  if symptoms less severe.  You Must read complete instructions/literature along with all the possible  adverse reactions/side effects for all the Medicines you take and that have been prescribed to you. Take any new Medicines after you have completely understood and accept all the possible adverse reactions/side effects.   Please note  You were cared for by a hospitalist during your hospital stay. If you have any questions about your discharge medications or the care you received while you were in the hospital after you are discharged, you can call the unit and asked  to speak with the hospitalist on call if the hospitalist that took care of you is not available. Once you are discharged, your primary care physician will handle any further medical issues. Please note that NO REFILLS for any discharge medications will be authorized once you are discharged, as it is imperative that you return to your primary care physician (or establish a relationship with a primary care physician if you do not have one) for your aftercare needs so that they can reassess your need for medications and monitor your lab values. Today   SUBJECTIVE  overall doing good  VITAL SIGNS:  Blood pressure (!) 178/95, pulse (!) 53, temperature 97.8 F (36.6 C), temperature source Oral, resp. rate 18, height 6\' 1"  (1.854 m), weight 120.8 kg, SpO2 98 %.  I/O:    Intake/Output Summary (Last 24 hours) at 02/15/2018 1425 Last data filed at 02/15/2018 1400 Gross per 24 hour  Intake 1923.26 ml  Output 2775 ml  Net -851.74 ml    PHYSICAL EXAMINATION:  GENERAL:  72 y.o.-year-old patient lying in the bed with no acute distress.  EYES: Pupils equal, round, reactive to light and accommodation. No scleral icterus. Extraocular muscles intact.  HEENT: Head atraumatic, normocephalic. Oropharynx and nasopharynx clear.  NECK:  Supple, no jugular venous distention. No thyroid enlargement, no tenderness.  LUNGS: Normal breath sounds bilaterally, no wheezing, rales,rhonchi or crepitation. No use of accessory muscles of respiration.  CARDIOVASCULAR: S1, S2 normal. No murmurs, rubs, or gallops.  ABDOMEN: Soft, non-tender, non-distended. Bowel sounds present. No organomegaly or mass.  EXTREMITIES: No pedal edema, cyanosis, or clubbing.  NEUROLOGIC: Cranial nerves II through XII are intact. Muscle strength 5/5 in all extremities. Sensation intact. Gait not checked.  PSYCHIATRIC: The patient is alert and oriented x 3.  SKIN: No obvious rash, lesion, or ulcer.   DATA REVIEW:   CBC  Recent Labs  Lab  02/15/18 0314  WBC 8.2  HGB 10.1*  HCT 30.8*  PLT 138*    Chemistries  Recent Labs  Lab 02/15/18 0314  NA 140  K 5.0  CL 114*  CO2 22  GLUCOSE 74  BUN 36*  CREATININE 3.77*  CALCIUM 8.7*    Microbiology Results   No results found for this or any previous visit (from the past 240 hour(s)).  RADIOLOGY:  Dg Chest 2 View  Result Date: 02/14/2018 CLINICAL DATA:  Shortness of breath. Chest pain. EXAM: CHEST - 2 VIEW COMPARISON:  10/17/2012 FINDINGS: The cardiomediastinal silhouette is within normal limits. Minimal bibasilar opacities likely represent atelectasis. No edema, pleural effusion, or pneumothorax is identified. No acute osseous abnormality is seen. IMPRESSION: Minimal bibasilar atelectasis. Electronically Signed   By: Logan Bores M.D.   On: 02/14/2018 14:20   US Renal  Result Date: 02/15/2018 CLINICAL DATA:  Acute on chronic renal failure. EXAM: RENAL / URINARY TRACT ULTRASOUND COMPLETE COMPARISON:  Ultrasound 08/30/2015.  CT 10/17/2012. FINDINGS: Right Kidney: Renal measurements: 13.7 x 9.4 x 5.9 cm = volume: 389 mL. Increased echogenicity consistent chronic medical renal  disease. Renal cortical thinning. No hydronephrosis. Three simple cyst measuring 4.7, 3.3, and 2.4 cm are noted. Left Kidney: Renal measurements: 11.6 x 6.5 x 5.0 = volume: 197 mL. Increased echogenicity consistent with medical renal disease. Renal cortical thinning. No hydronephrosis. Two cysts with faint internal echoes and measuring 2.4 and 2.9 cm are again noted. Similar findings noted on prior studies. Bladder: Appears normal for degree of bladder distention. IMPRESSION: 1. Bilateral increased renal echogenicity consistent with chronic medical renal disease. Bilateral renal cortical thinning also noted. 2. Benign-appearing cyst again noted both kidneys. Similar findings noted on prior exams. Electronically Signed   By: Marcello Moores  Register   On: 02/15/2018 11:27     Management plans discussed with the  patient, family and they are in agreement.  CODE STATUS:     Code Status Orders  (From admission, onward)         Start     Ordered   02/14/18 2033  Full code  Continuous     02/14/18 2032        Code Status History    This patient has a current code status but no historical code status.      TOTAL TIME TAKING CARE OF THIS PATIENT: *40* minutes.    Fritzi Mandes M.D on 02/15/2018 at 2:25 PM  Between 7am to 6pm - Pager - [phone] After 6pm go to [email or web address] - password [email or web address] [facility]  Office  [phone]  CC: Primary care physician; Tyler Ruths, MD

## 2018-02-15 NOTE — Progress Notes (Signed)
Inpatient Diabetes Program Recommendations  AACE/ADA: New Consensus Statement on Inpatient Glycemic Control (2019)  Target Ranges:  Prepandial:   less than 140 mg/dL      Peak postprandial:   less than 180 mg/dL (1-2 hours)      Critically ill patients:  140 - 180 mg/dL   Results for DUAN, SCHARNHORST (MRN [id number]) as of 02/15/2018 11:09  Ref. Range 02/14/2018 20:35 02/15/2018 07:57  Glucose-Capillary Latest Ref Range: 70 - 99 mg/dL 119 (H) 92  Results for RAYDELL, MANERS (MRN [id number]) as of 02/15/2018 11:09  Ref. Range 02/15/2018 03:14  Glucose Latest Ref Range: 70 - 99 mg/dL 74   Review of Glycemic Control  Diabetes history: DM2 Outpatient Diabetes medications: Amaryl 2 mg QAM, Januvia 100 mg daily Current orders for Inpatient glycemic control: Novolog 0-9 units TID with meals, Amaryl 2 mg QAM, Tradjenta 5 mg daily  Inpatient Diabetes Program Recommendations:  Oral Agents: While inpatient, may want to consider discontinuing Amaryl and Tradjenta and use insulin for inpatient glycemic control.   Thanks, Barnie Alderman, RN, MSN, CDE Diabetes Coordinator Inpatient Diabetes Program [phone] (Team Pager from 8am to 5pm)

## 2018-02-15 NOTE — Progress Notes (Signed)
Back from ultrasound

## 2018-02-15 NOTE — Plan of Care (Signed)
  Problem: Clinical Measurements: Goal: Will remain free from infection Outcome: Progressing Note:  Remains afebrile Goal: Diagnostic test results will improve Outcome: Progressing Note:  K on admission was 6.0, today it is 5.0 Neph consult pending

## 2018-02-15 NOTE — Progress Notes (Signed)
[facility]  ROUNDING NOTE   Subjective:  Overall kidney function remains quite low at the moment. EGFR currently 15 with a potassium of 5.0. We talked about the potential for renal placement therapy in the future today.    Objective:  Vital signs in last 24 hours:  Temp:  [97.8 F (36.6 C)-98.4 F (36.9 C)] 97.8 F (36.6 C) (11/12 0757) Pulse Rate:  [47-60] 53 (11/12 0757) Resp:  [16-23] 18 (11/12 0557) BP: (155-220)/(68-97) 178/95 (11/12 0757) SpO2:  [94 %-98 %] 98 % (11/12 0757) Weight:  [120.8 kg] 120.8 kg (11/12 0757)  Weight change:  Filed Weights   02/15/18 0757  Weight: 120.8 kg    Intake/Output: I/O last 3 completed shifts: In: 1000 [IV Piggyback:1000] Out: 1975 [Urine:1975]   Intake/Output this shift:  Total I/O In: -  Out: 800 [Urine:800]  Physical Exam: General: No acute distress  Head: Normocephalic, atraumatic. Moist oral mucosal membranes  Eyes: Anicteric  Neck: Supple, trachea midline  Lungs:  Clear to auscultation, normal effort  Heart: S1S2 no rubs  Abdomen:  Soft, nontender, bowel sounds present  Extremities: 1+ peripheral edema.  Neurologic: Awake, alert, following commands  Skin: No lesions       Basic Metabolic Panel: Recent Labs  Lab 02/14/18 1337 02/15/18 0314  NA 135 140  K 6.0* 5.0  CL 103 114*  CO2 24 22  GLUCOSE 234* 74  BUN 35* 36*  CREATININE 3.97* 3.77*  CALCIUM 8.6* 8.7*    Liver Function Tests: No results for input(s): AST, ALT, ALKPHOS, BILITOT, PROT, ALBUMIN in the last 168 hours. No results for input(s): LIPASE, AMYLASE in the last 168 hours. No results for input(s): AMMONIA in the last 168 hours.  CBC: Recent Labs  Lab 02/14/18 1337 02/15/18 0314  WBC 9.1 8.2  HGB 12.1* 10.1*  HCT 36.4* 30.8*  MCV 89.0 90.3  PLT 164 138*    Cardiac Enzymes: Recent Labs  Lab 02/14/18 1122 02/14/18 1337  TROPONINI 0.16* 0.04*    BNP: Invalid input(s): POCBNP  CBG: Recent Labs  Lab  02/14/18 2035 02/15/18 0757 02/15/18 1125  GLUCAP 119* 92 93    Microbiology: No results found for this or any previous visit.  Coagulation Studies: No results for input(s): LABPROT, INR in the last 72 hours.  Urinalysis: Recent Labs    02/14/18 1845  COLORURINE YELLOW*  LABSPEC 1.013  PHURINE 6.0  GLUCOSEU 150*  HGBUR NEGATIVE  BILIRUBINUR NEGATIVE  KETONESUR NEGATIVE  PROTEINUR >=300*  NITRITE NEGATIVE  LEUKOCYTESUR NEGATIVE      Imaging: Dg Chest 2 View  Result Date: 02/14/2018 CLINICAL DATA:  Shortness of breath. Chest pain. EXAM: CHEST - 2 VIEW COMPARISON:  10/17/2012 FINDINGS: The cardiomediastinal silhouette is within normal limits. Minimal bibasilar opacities likely represent atelectasis. No edema, pleural effusion, or pneumothorax is identified. No acute osseous abnormality is seen. IMPRESSION: Minimal bibasilar atelectasis. Electronically Signed   By: Logan Bores M.D.   On: 02/14/2018 14:20   US Renal  Result Date: 02/15/2018 CLINICAL DATA:  Acute on chronic renal failure. EXAM: RENAL / URINARY TRACT ULTRASOUND COMPLETE COMPARISON:  Ultrasound 08/30/2015.  CT 10/17/2012. FINDINGS: Right Kidney: Renal measurements: 13.7 x 9.4 x 5.9 cm = volume: 389 mL. Increased echogenicity consistent chronic medical renal disease. Renal cortical thinning. No hydronephrosis. Three simple cyst measuring 4.7, 3.3, and 2.4 cm are noted. Left Kidney: Renal measurements: 11.6 x 6.5 x 5.0 = volume: 197 mL. Increased echogenicity consistent with medical renal disease. Renal cortical  thinning. No hydronephrosis. Two cysts with faint internal echoes and measuring 2.4 and 2.9 cm are again noted. Similar findings noted on prior studies. Bladder: Appears normal for degree of bladder distention. IMPRESSION: 1. Bilateral increased renal echogenicity consistent with chronic medical renal disease. Bilateral renal cortical thinning also noted. 2. Benign-appearing cyst again noted both kidneys.  Similar findings noted on prior exams. Electronically Signed   By: Marcello Moores  Register   On: 02/15/2018 11:27     Medications:   . sodium chloride 50 mL/hr at 02/14/18 2106   . amLODipine  10 mg Oral Daily  . clopidogrel  75 mg Oral Daily  . glimepiride  2 mg Oral Q breakfast  . heparin  5,000 Units Subcutaneous Q8H  . insulin aspart  0-9 Units Subcutaneous TID WC  . linagliptin  5 mg Oral Daily  . sodium zirconium cyclosilicate  10 g Oral BID  . torsemide  10 mg Oral Daily     Assessment/ Plan:  72 y.o. male past medical history of chronic kidney disease stage IV baseline EGFR 18, hypertension, prior history of CVA, anemia of chronic kidney disease, hyperkalemia, secondary hyperparathyroidism who presents now with shortness of breath and elevated blood pressure.  1.  Acute renal failure/chronic kidney disease stage IV baseline EGFR 18/Diabetes melltius type 2 with CKD.  Overall renal function remains quite low.  EGFR currently 15.  We talked at length today about the potential for renal placement therapy in the relative near future.  He verbalized understanding of this.  Continue to monitor renal parameters daily for now.  Continue IV fluids for now.  2.  Hyperkalemia. Continue sodium zirconium 10 g by mouth twice a day.  3.  Anemia of chronic kidney disease.  Continue to monitor hemoglobin as an outpatient.  No indication for Procrit at the moment.   LOS: 1 Tyler Wiley 11/12/20191:00 PM

## 2018-02-18 ENCOUNTER — Telehealth: Payer: Self-pay

## 2018-02-18 NOTE — Telephone Encounter (Signed)
Flagged on EMMI report for having unfilled prescriptions.  Called and spoke with patient who mentioned he was able to fill everything.  No questions or concerns currently.  I thanked him for his time and informed him that he would receive one more automated call checking in over the next few days.

## 2018-05-07 ENCOUNTER — Inpatient Hospital Stay: Payer: Medicare Other

## 2018-05-07 ENCOUNTER — Encounter: Payer: Self-pay | Admitting: Emergency Medicine

## 2018-05-07 ENCOUNTER — Other Ambulatory Visit: Payer: Self-pay

## 2018-05-07 ENCOUNTER — Emergency Department: Payer: Medicare Other

## 2018-05-07 ENCOUNTER — Inpatient Hospital Stay
Admission: EM | Admit: 2018-05-07 | Discharge: 2018-05-08 | DRG: 069 | Disposition: A | Discharge status: Home / Self Care | Payer: Medicare Other | Attending: Internal Medicine | Admitting: Internal Medicine

## 2018-05-07 ENCOUNTER — Inpatient Hospital Stay (HOSPITAL_COMMUNITY)
Admit: 2018-05-07 | Discharge: 2018-05-07 | Disposition: A | Discharge status: Home / Self Care | Payer: Medicare Other | Attending: Internal Medicine | Admitting: Internal Medicine

## 2018-05-07 DIAGNOSIS — R4701 Aphasia: Secondary | ICD-10-CM | POA: Diagnosis not present

## 2018-05-07 DIAGNOSIS — I129 Hypertensive chronic kidney disease with stage 1 through stage 4 chronic kidney disease, or unspecified chronic kidney disease: Secondary | ICD-10-CM | POA: Diagnosis present

## 2018-05-07 DIAGNOSIS — Z7951 Long term (current) use of inhaled steroids: Secondary | ICD-10-CM | POA: Diagnosis not present

## 2018-05-07 DIAGNOSIS — Z87891 Personal history of nicotine dependence: Secondary | ICD-10-CM

## 2018-05-07 DIAGNOSIS — Z79899 Other long term (current) drug therapy: Secondary | ICD-10-CM

## 2018-05-07 DIAGNOSIS — R29701 NIHSS score 1: Secondary | ICD-10-CM | POA: Diagnosis present

## 2018-05-07 DIAGNOSIS — E1122 Type 2 diabetes mellitus with diabetic chronic kidney disease: Secondary | ICD-10-CM | POA: Diagnosis present

## 2018-05-07 DIAGNOSIS — Z7984 Long term (current) use of oral hypoglycemic drugs: Secondary | ICD-10-CM | POA: Diagnosis not present

## 2018-05-07 DIAGNOSIS — E875 Hyperkalemia: Secondary | ICD-10-CM

## 2018-05-07 DIAGNOSIS — I340 Nonrheumatic mitral (valve) insufficiency: Secondary | ICD-10-CM

## 2018-05-07 DIAGNOSIS — Z8249 Family history of ischemic heart disease and other diseases of the circulatory system: Secondary | ICD-10-CM | POA: Diagnosis not present

## 2018-05-07 DIAGNOSIS — W109XXA Fall (on) (from) unspecified stairs and steps, initial encounter: Secondary | ICD-10-CM | POA: Diagnosis present

## 2018-05-07 DIAGNOSIS — N184 Chronic kidney disease, stage 4 (severe): Secondary | ICD-10-CM | POA: Diagnosis present

## 2018-05-07 DIAGNOSIS — Z8711 Personal history of peptic ulcer disease: Secondary | ICD-10-CM | POA: Diagnosis not present

## 2018-05-07 DIAGNOSIS — I160 Hypertensive urgency: Secondary | ICD-10-CM | POA: Diagnosis present

## 2018-05-07 DIAGNOSIS — Z9049 Acquired absence of other specified parts of digestive tract: Secondary | ICD-10-CM | POA: Diagnosis not present

## 2018-05-07 DIAGNOSIS — G459 Transient cerebral ischemic attack, unspecified: Principal | ICD-10-CM

## 2018-05-07 DIAGNOSIS — Z7902 Long term (current) use of antithrombotics/antiplatelets: Secondary | ICD-10-CM

## 2018-05-07 DIAGNOSIS — R471 Dysarthria and anarthria: Secondary | ICD-10-CM | POA: Diagnosis present

## 2018-05-07 DIAGNOSIS — E041 Nontoxic single thyroid nodule: Secondary | ICD-10-CM | POA: Diagnosis present

## 2018-05-07 DIAGNOSIS — E785 Hyperlipidemia, unspecified: Secondary | ICD-10-CM | POA: Diagnosis present

## 2018-05-07 DIAGNOSIS — Z833 Family history of diabetes mellitus: Secondary | ICD-10-CM | POA: Diagnosis not present

## 2018-05-07 DIAGNOSIS — I639 Cerebral infarction, unspecified: Secondary | ICD-10-CM | POA: Diagnosis present

## 2018-05-07 DIAGNOSIS — G4733 Obstructive sleep apnea (adult) (pediatric): Secondary | ICD-10-CM | POA: Diagnosis present

## 2018-05-07 DIAGNOSIS — Z8673 Personal history of transient ischemic attack (TIA), and cerebral infarction without residual deficits: Secondary | ICD-10-CM | POA: Diagnosis not present

## 2018-05-07 DIAGNOSIS — N179 Acute kidney failure, unspecified: Secondary | ICD-10-CM | POA: Diagnosis present

## 2018-05-07 LAB — GLUCOSE, CAPILLARY
GLUCOSE-CAPILLARY: 122 mg/dL — AB (ref 70–99)
Glucose-Capillary: 203 mg/dL — ABNORMAL HIGH (ref 70–99)
Glucose-Capillary: 105 mg/dL — ABNORMAL HIGH (ref 70–99)
Glucose-Capillary: 85 mg/dL (ref 70–99)

## 2018-05-07 LAB — URINE DRUG SCREEN, QUALITATIVE (ARMC ONLY)
AMPHETAMINES, UR SCREEN: NOT DETECTED
Barbiturates, Ur Screen: NOT DETECTED
Benzodiazepine, Ur Scrn: NOT DETECTED
Cannabinoid 50 Ng, Ur ~~LOC~~: NOT DETECTED
Cocaine Metabolite,Ur ~~LOC~~: NOT DETECTED
MDMA (ECSTASY) UR SCREEN: NOT DETECTED
METHADONE SCREEN, URINE: NOT DETECTED
Opiate, Ur Screen: NOT DETECTED
Phencyclidine (PCP) Ur S: NOT DETECTED
Tricyclic, Ur Screen: NOT DETECTED

## 2018-05-07 LAB — URINALYSIS, ROUTINE W REFLEX MICROSCOPIC
BILIRUBIN URINE: NEGATIVE
Bacteria, UA: NONE SEEN
GLUCOSE, UA: 150 mg/dL — AB
HGB URINE DIPSTICK: NEGATIVE
KETONES UR: NEGATIVE mg/dL
LEUKOCYTES UA: NEGATIVE
NITRITE: NEGATIVE
PH: 6 (ref 5.0–8.0)
Specific Gravity, Urine: 1.011 (ref 1.005–1.030)

## 2018-05-07 LAB — CBC WITH DIFFERENTIAL/PLATELET
Abs Immature Granulocytes: 0.03 10*3/uL (ref 0.00–0.07)
BASOS ABS: 0 10*3/uL (ref 0.0–0.1)
Basophils Relative: 1 %
Eosinophils Absolute: 0.5 10*3/uL (ref 0.0–0.5)
Eosinophils Relative: 8 %
HEMATOCRIT: 32.9 % — AB (ref 39.0–52.0)
Hemoglobin: 10.9 g/dL — ABNORMAL LOW (ref 13.0–17.0)
IMMATURE GRANULOCYTES: 1 %
LYMPHS ABS: 1.1 10*3/uL (ref 0.7–4.0)
LYMPHS PCT: 16 %
MCH: 29.6 pg (ref 26.0–34.0)
MCHC: 33.1 g/dL (ref 30.0–36.0)
MCV: 89.4 fL (ref 80.0–100.0)
Monocytes Absolute: 0.7 10*3/uL (ref 0.1–1.0)
Monocytes Relative: 10 %
NEUTROS PCT: 64 %
Neutro Abs: 4.2 10*3/uL (ref 1.7–7.7)
Platelets: 135 10*3/uL — ABNORMAL LOW (ref 150–400)
RBC: 3.68 MIL/uL — AB (ref 4.22–5.81)
RDW: 13.7 % (ref 11.5–15.5)
WBC: 6.5 10*3/uL (ref 4.0–10.5)
nRBC: 0 % (ref 0.0–0.2)

## 2018-05-07 LAB — COMPREHENSIVE METABOLIC PANEL
ALBUMIN: 3 g/dL — AB (ref 3.5–5.0)
ALT: 13 U/L (ref 0–44)
ANION GAP: 6 (ref 5–15)
AST: 13 U/L — AB (ref 15–41)
Alkaline Phosphatase: 97 U/L (ref 38–126)
BILIRUBIN TOTAL: 0.8 mg/dL (ref 0.3–1.2)
BUN: 33 mg/dL — AB (ref 8–23)
CO2: 23 mmol/L (ref 22–32)
Calcium: 8.3 mg/dL — ABNORMAL LOW (ref 8.9–10.3)
Chloride: 108 mmol/L (ref 98–111)
Creatinine, Ser: 4.83 mg/dL — ABNORMAL HIGH (ref 0.61–1.24)
GFR calc Af Amer: 13 mL/min — ABNORMAL LOW (ref >60)
GFR calc non Af Amer: 11 mL/min — ABNORMAL LOW (ref >60)
GLUCOSE: 217 mg/dL — AB (ref 70–99)
Potassium: 5.8 mmol/L — ABNORMAL HIGH (ref 3.5–5.1)
SODIUM: 137 mmol/L (ref 135–145)
TOTAL PROTEIN: 6.6 g/dL (ref 6.5–8.1)

## 2018-05-07 LAB — LIPID PANEL
CHOLESTEROL: 202 mg/dL — AB (ref 0–200)
HDL: 30 mg/dL — ABNORMAL LOW (ref >40)
LDL Cholesterol: 111 mg/dL — ABNORMAL HIGH (ref 0–99)
Total CHOL/HDL Ratio: 6.7 RATIO
Triglycerides: 306 mg/dL — ABNORMAL HIGH (ref <150)
VLDL: 61 mg/dL — ABNORMAL HIGH (ref 0–40)

## 2018-05-07 LAB — TROPONIN I

## 2018-05-07 LAB — PROTIME-INR
INR: 0.99
Prothrombin Time: 13 seconds (ref 11.4–15.2)

## 2018-05-07 LAB — CREATININE, SERUM
Creatinine, Ser: 4.83 mg/dL — ABNORMAL HIGH (ref 0.61–1.24)
GFR calc Af Amer: 13 mL/min — ABNORMAL LOW (ref >60)
GFR calc non Af Amer: 11 mL/min — ABNORMAL LOW (ref >60)

## 2018-05-07 LAB — APTT: APTT: 35 s (ref 24–36)

## 2018-05-07 LAB — SAMPLE TO BLOOD BANK

## 2018-05-07 MED ORDER — INSULIN ASPART 100 UNIT/ML ~~LOC~~ SOLN
0.0000 [IU] | Freq: Three times a day (TID) | SUBCUTANEOUS | Status: DC
  Administered 2018-05-08: 1 [IU] via SUBCUTANEOUS
  Filled 2018-05-07: qty 1

## 2018-05-07 MED ORDER — INSULIN ASPART 100 UNIT/ML ~~LOC~~ SOLN
5.0000 [IU] | Freq: Once | SUBCUTANEOUS | Status: AC
  Administered 2018-05-07: 5 [IU] via INTRAVENOUS
  Filled 2018-05-07: qty 1

## 2018-05-07 MED ORDER — ALBUTEROL SULFATE (2.5 MG/3ML) 0.083% IN NEBU: 2.5000 mg | INHALATION_SOLUTION | Freq: Once | RESPIRATORY_TRACT | Status: DC

## 2018-05-07 MED ORDER — ASPIRIN EC 81 MG PO TBEC
81.0000 mg | DELAYED_RELEASE_TABLET | Freq: Every day | ORAL | Status: DC
  Administered 2018-05-07 – 2018-05-08 (×2): 81 mg via ORAL
  Filled 2018-05-07 (×2): qty 1

## 2018-05-07 MED ORDER — CLOPIDOGREL BISULFATE 75 MG PO TABS
75.0000 mg | ORAL_TABLET | Freq: Every evening | ORAL | Status: DC
  Administered 2018-05-07 – 2018-05-08 (×2): 75 mg via ORAL
  Filled 2018-05-07 (×2): qty 1

## 2018-05-07 MED ORDER — GLIMEPIRIDE 2 MG PO TABS
2.0000 mg | ORAL_TABLET | Freq: Every day | ORAL | Status: DC
  Administered 2018-05-08: 2 mg via ORAL
  Filled 2018-05-07: qty 1

## 2018-05-07 MED ORDER — LINAGLIPTIN 5 MG PO TABS
5.0000 mg | ORAL_TABLET | Freq: Every day | ORAL | Status: DC
  Administered 2018-05-08: 5 mg via ORAL
  Filled 2018-05-07: qty 1

## 2018-05-07 MED ORDER — PATIROMER SORBITEX CALCIUM 8.4 G PO PACK
8.4000 g | PACK | Freq: Every day | ORAL | Status: DC
  Administered 2018-05-07 – 2018-05-08 (×2): 8.4 g via ORAL
  Filled 2018-05-07 (×4): qty 1

## 2018-05-07 MED ORDER — FLUTICASONE PROPIONATE 50 MCG/ACT NA SUSP
2.0000 | Freq: Every day | NASAL | Status: DC | PRN
  Filled 2018-05-07: qty 16

## 2018-05-07 MED ORDER — ACETAMINOPHEN 325 MG PO TABS
650.0000 mg | ORAL_TABLET | ORAL | Status: DC | PRN
  Administered 2018-05-07 – 2018-05-08 (×3): 650 mg via ORAL
  Filled 2018-05-07 (×3): qty 2

## 2018-05-07 MED ORDER — HYDRALAZINE HCL 20 MG/ML IJ SOLN
10.0000 mg | Freq: Four times a day (QID) | INTRAMUSCULAR | Status: DC | PRN
Start: 2018-05-07 — End: 2018-05-08
  Administered 2018-05-07 – 2018-05-08 (×3): 10 mg via INTRAVENOUS
  Filled 2018-05-07 (×3): qty 1

## 2018-05-07 MED ORDER — SODIUM CHLORIDE 0.9 % IV SOLN
INTRAVENOUS | Status: DC
  Administered 2018-05-07 – 2018-05-08 (×2): via INTRAVENOUS

## 2018-05-07 MED ORDER — HEPARIN SODIUM (PORCINE) 5000 UNIT/ML IJ SOLN
5000.0000 [IU] | Freq: Three times a day (TID) | INTRAMUSCULAR | Status: DC
  Administered 2018-05-07 – 2018-05-08 (×4): 5000 [IU] via SUBCUTANEOUS
  Filled 2018-05-07 (×4): qty 1

## 2018-05-07 MED ORDER — ACETAMINOPHEN 160 MG/5ML PO SOLN
650.0000 mg | ORAL | Status: DC | PRN
  Filled 2018-05-07: qty 20.3

## 2018-05-07 MED ORDER — CARVEDILOL 25 MG PO TABS
25.0000 mg | ORAL_TABLET | Freq: Two times a day (BID) | ORAL | Status: DC
  Administered 2018-05-07 – 2018-05-08 (×3): 25 mg via ORAL
  Filled 2018-05-07 (×3): qty 1

## 2018-05-07 MED ORDER — ONDANSETRON HCL 4 MG/2ML IJ SOLN
4.0000 mg | Freq: Four times a day (QID) | INTRAMUSCULAR | Status: DC | PRN
  Administered 2018-05-07: 4 mg via INTRAVENOUS
  Filled 2018-05-07: qty 2

## 2018-05-07 MED ORDER — STROKE: EARLY STAGES OF RECOVERY BOOK
Freq: Once | Status: AC
  Administered 2018-05-07: 13:00:00

## 2018-05-07 MED ORDER — ACETAMINOPHEN 650 MG RE SUPP: 650.0000 mg | RECTAL | Status: DC | PRN

## 2018-05-07 MED ORDER — LORAZEPAM 2 MG/ML IJ SOLN
1.0000 mg | Freq: Once | INTRAMUSCULAR | Status: AC
  Administered 2018-05-07: 16:00:00 1 mg via INTRAVENOUS
  Filled 2018-05-07: qty 1

## 2018-05-07 MED ORDER — CALCIUM GLUCONATE 10 % IV SOLN
1.0000 g | Freq: Once | INTRAVENOUS | Status: AC
  Administered 2018-05-07: 13:00:00 1 g via INTRAVENOUS
  Filled 2018-05-07: qty 10

## 2018-05-07 MED ORDER — ENOXAPARIN SODIUM 40 MG/0.4ML ~~LOC~~ SOLN: 40.0000 mg | SUBCUTANEOUS | Status: DC

## 2018-05-07 NOTE — Consult Note (Addendum)
Referring Physician:     Chief Complaint: Word finding difficulty  HPI: Tyler Wiley is an 73 y.o. male with past medical history OSA, hypertension, type 2 Diabetes mellitus, stage 4 chronic kidney disease, hyperlipidemia, and right MCA stroke presenting to the ED with chief complaints of speech difficulty. Patient report that he fell down the stairs yesterday hitting the back of his head and side. He does not recall the events prior to the fall or if he fell due to extremity weakness. Per patient's wife who provides collateral information, patient went to bed last night at around 10:30 pm and woke up at 2 am without any issues. However when he woke up at about 6:30 -7 am, she noticed that he was having difficulty finding the "right words" which was very unusual for him. Patient also appeared restless and was walking from room to room unsure why he walked in there.  Denies associated altered sensorium, cranial nerve deficit, seizures, focal motor or sensory deficits, diplopia, nausea or vomiting, dizziness, paresthesia (numbness, tingling, pins-and-needles sensation) or a heavy feeling in an extremity. On arrival to the ED patient's symptoms had resolved. Initial NIH stroke scale 1. Ct head negative for acute stroke. Patient being admitted for further stroke work up and management.  Date last known well: Date: 05/07/2018 Time last known well: Time: 02:00 tPA Given: No: Symptoms resolving  Past Medical History:  Diagnosis Date  . Diabetes mellitus without complication ([place])   . Gastritis   . Hypertension   . Renal disorder   . Stroke [facility])     Past Surgical History:  Procedure Laterality Date  . CHOLECYSTECTOMY      Family History  Problem Relation Age of Onset  . CAD Father    Social History:  reports that he has quit smoking. He has never used smokeless tobacco. He reports current alcohol use. He reports that he does not use drugs.  Allergies: No Known Allergies  Medications:  I  have reviewed the patient's current medications. Prior to Admission:  Medications Prior to Admission  Medication Sig Dispense Refill Last Dose  . carvedilol (COREG) 25 MG tablet Take 25 mg by mouth 2 (two) times daily with a meal.    05/07/2018 at 0700  . clopidogrel (PLAVIX) 75 MG tablet Take 75 mg by mouth every evening.    05/06/2018 at 1900  . fluticasone (FLONASE) 50 MCG/ACT nasal spray Place 2 sprays into both nostrils daily as needed for allergies or rhinitis.    Unknown at PRN  . glimepiride (AMARYL) 2 MG tablet Take 2 mg by mouth daily with breakfast.   05/07/2018 at 0700  . sitaGLIPtin (JANUVIA) 100 MG tablet Take 100 mg by mouth daily.   05/07/2018 at 0700  . sodium zirconium cyclosilicate (LOKELMA) 10 g PACK packet Take 10 g by mouth daily. 15 packet 0 Past Week at Unknown time  . torsemide (DEMADEX) 10 MG tablet Take 10 mg by mouth daily.   05/07/2018 at 0700   Scheduled: .  stroke: mapping our early stages of recovery book   Does not apply Once  . albuterol  2.5 mg Nebulization Once  . calcium gluconate  1 g Intravenous Once  . carvedilol  25 mg Oral BID WC  . clopidogrel  75 mg Oral QPM  . enoxaparin (LOVENOX) injection  40 mg Subcutaneous Q24H  . [START ON 05/08/2018] glimepiride  2 mg Oral Q breakfast  . insulin aspart  0-9 Units Subcutaneous TID WC  . insulin  aspart  5 Units Intravenous Once  . linagliptin  5 mg Oral Daily  . patiromer  8.4 g Oral Daily    ROS: History obtained from the patient   General ROS: negative for - chills, fatigue, fever, night sweats, weight gain or weight loss Psychological ROS: negative for - behavioral disorder, hallucinations, memory difficulties, mood swings or suicidal ideation Ophthalmic ROS: negative for - blurry vision, double vision, eye pain or loss of vision ENT ROS: negative for - epistaxis, nasal discharge, oral lesions, sore throat, tinnitus or vertigo Allergy and Immunology ROS: negative for - hives or itchy/watery eyes Hematological  and Lymphatic ROS: negative for - bleeding problems, bruising or swollen lymph nodes Endocrine ROS: negative for - galactorrhea, hair pattern changes, polydipsia/polyuria or temperature intolerance Respiratory ROS: negative for - cough, hemoptysis, shortness of breath or wheezing Cardiovascular ROS: negative for - chest pain, dyspnea on exertion, edema or irregular heartbeat Gastrointestinal ROS: negative for - abdominal pain, diarrhea, hematemesis, nausea/vomiting or stool incontinence Genito-Urinary ROS: negative for - dysuria, hematuria, incontinence or urinary frequency/urgency Musculoskeletal ROS: negative for - joint swelling or muscular weakness Neurological ROS: as noted in HPI Dermatological ROS: negative for rash and skin lesion changes  Physical Examination: Blood pressure (!) 191/107, pulse (!) 54, temperature 98.1 F (36.7 C), temperature source Oral, resp. rate 16, height 6' (1.829 m), weight 113.4 kg, SpO2 97 %.   HEENT-  Normocephalic, no lesions, without obvious abnormality.  Normal external eye and conjunctiva.  Normal TM's bilaterally.  Normal auditory canals and external ears. Normal external nose, mucus membranes and septum.  Normal pharynx. Cardiovascular- S1, S2 normal, pulses palpable throughout   Lungs- chest clear, no wheezing, rales, normal symmetric air entry Abdomen- soft, non-tender; bowel sounds normal; no masses,  no organomegaly Extremities- no edema Lymph-no adenopathy palpable Musculoskeletal-no joint tenderness, deformity or swelling Skin-warm and dry, no hyperpigmentation, vitiligo, or suspicious lesions  Neurological Exam   Mental Status: Alert, oriented, thought content appropriate.  Speech fluent without evidence of aphasia.  Able to follow 3 step commands without difficulty. Attention span and concentration seemed appropriate  Cranial Nerves: II: Discs flat bilaterally; Visual fields grossly normal, pupils equal, round, reactive to light and  accommodation III,IV, VI: ptosis not present, extra-ocular motions intact bilaterally V,VII: smile symmetric, facial light touch sensation intact VIII: hearing normal bilaterally IX,X: gag reflex present XI: bilateral shoulder shrug XII: midline tongue extension Motor: Right :  Upper extremity   5/5 Without pronator drift      Left: Upper extremity   5/5 without pronator drift Right:   Lower extremity   5/5                                          Left: Lower extremity   5/5 Tone and bulk:normal tone throughout; no atrophy noted Sensory: Pinprick and light touch intact bilaterally Deep Tendon Reflexes: 1+ and symmetric throughout. Absent ankle jerks Plantars: Right: mute                              Left: mute Cerebellar: Finger-to-nose testing intact bilaterally. Heel to shin testing normal bilaterally Gait: not tested due to safety concerns  Data Reviewed  Laboratory Studies:  Basic Metabolic Panel: Recent Labs  Lab 05/07/18 0908  NA 137  K 5.8*  CL 108  CO2 23  GLUCOSE 217*  BUN 33*  CREATININE 4.83*  CALCIUM 8.3*    Liver Function Tests: Recent Labs  Lab 05/07/18 0908  AST 13*  ALT 13  ALKPHOS 97  BILITOT 0.8  PROT 6.6  ALBUMIN 3.0*   No results for input(s): LIPASE, AMYLASE in the last 168 hours. No results for input(s): AMMONIA in the last 168 hours.  CBC: Recent Labs  Lab 05/07/18 0908  WBC 6.5  NEUTROABS 4.2  HGB 10.9*  HCT 32.9*  MCV 89.4  PLT 135*    Cardiac Enzymes: No results for input(s): CKTOTAL, CKMB, CKMBINDEX, TROPONINI in the last 168 hours.  BNP: Invalid input(s): POCBNP  CBG: Recent Labs  Lab 05/07/18 0924  GLUCAP 203*    Microbiology: No results found for this or any previous visit.  Coagulation Studies: No results for input(s): LABPROT, INR in the last 72 hours.  Urinalysis:  Recent Labs  Lab 05/07/18 1001  COLORURINE STRAW*  LABSPEC 1.011  PHURINE 6.0  GLUCOSEU 150*  HGBUR NEGATIVE  BILIRUBINUR NEGATIVE   KETONESUR NEGATIVE  PROTEINUR >=300*  NITRITE NEGATIVE  LEUKOCYTESUR NEGATIVE    Lipid Panel: No results found for: CHOL, TRIG, HDL, CHOLHDL, VLDL, LDLCALC  HgbA1C: No results found for: HGBA1C  Urine Drug Screen:      Component Value Date/Time   LABOPIA NONE DETECTED 05/07/2018 1001   COCAINSCRNUR NONE DETECTED 05/07/2018 1001   LABBENZ NONE DETECTED 05/07/2018 1001   AMPHETMU NONE DETECTED 05/07/2018 1001   THCU NONE DETECTED 05/07/2018 1001   LABBARB NONE DETECTED 05/07/2018 1001    Alcohol Level: No results for input(s): ETH in the last 168 hours.  Other results: EKG: unchanged from previous tracings, atrial fibrillation, rate 56 BPM. Vent. rate 56 BPM PR interval * ms QRS duration 102 ms QT/QTc 426/412 ms P-R-T axes * 39 50  Imaging: Ct Head Wo Contrast  Result Date: 05/07/2018 CLINICAL DATA:  Patient on Plavix who suffered a fall on stairs yesterday. Difficulty speaking today. EXAM: CT HEAD WITHOUT CONTRAST TECHNIQUE: Contiguous axial images were obtained from the base of the skull through the vertex without intravenous contrast. COMPARISON:  Head CT scan 07/09/2014. FINDINGS: Brain: No evidence of acute infarction, hemorrhage, hydrocephalus, extra-axial collection or mass lesion/mass effect. Remote right MCA infarct noted. Vascular: No hyperdense vessel or unexpected calcification. Skull: Intact.  No focal lesion. Sinuses/Orbits: The left frontal sinus is almost completely opacified. 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 Superior aspect of the left maxillary sinus demonstrates marked mucosal thickening. Mild mucosal thickening right sphenoid sinus noted. Other: None. IMPRESSION: No acute intracranial abnormality. Remote right MCA infarct. Sinus disease. Electronically Signed   By: Inge Rise M.D.   On: 05/07/2018 09:03   Patient seen and examined.  Clinical course and management discussed.  Necessary edits performed.  I agree with the above.  Assessment and plan of  care developed and discussed below.    Assessment: 73 y.o. male with past medical history OSA, hypertension, type 2 Diabetes mellitus, stage 4 chronic kidney disease, hyperlipidemia, and right MCA stroke presenting to the ED with chief complaints of speech difficulty. Concerns for ischemic event. Initial CT head reviewed and shows no acute intracranial abnormality. Patient reports he was taking Plavix 75 mg once a day prior to this event. Further work up recommended.  Stroke Risk Factors - diabetes mellitus, family history, hyperlipidemia and hypertension  Plan: 1. HgbA1c, fasting lipid panel 2. MRI, MRA  of the brain without contrast 3. PT consult, OT consult, Speech  consult 4. Echocardiogram 5. Carotid dopplers 6. Prophylactic therapy- start dual therapy Aspirin 81 mg/day and Plavix 75 mg /day  7. NPO until RN stroke swallow screen 8. Telemetry monitoring 9. Frequent neuro checks  This patient was staffed with Dr. Magda Paganini, Doy Mince who personally evaluated patient, reviewed documentation and agreed with assessment and plan of care as above.  Rufina Falco, DNP, FNP-BC Board certified Nurse Practitioner Neurology Department  05/07/2018, 11:13 AM  Alexis Goodell, MD Neurology [phone]  05/07/2018  12:24 PM

## 2018-05-07 NOTE — ED Triage Notes (Signed)
Fell on stairs yesterday about 6pm yesterday, takes plavix, and today wife notices he has trouble getting words.

## 2018-05-07 NOTE — Progress Notes (Signed)
Chart reviewed. Pt is currently on a carb modified diet with no reports of dysphagia. Intermittent speech deficits. Pt was unavailable during attempt to see earlier. St to f/u 1-2 days and proceed with language eval if still  Indicated.

## 2018-05-07 NOTE — ED Notes (Signed)
Neurology in room.

## 2018-05-07 NOTE — ED Notes (Signed)
MD arrived in room  

## 2018-05-07 NOTE — Progress Notes (Signed)
   05/07/18 0900  Clinical Encounter Type  Visited With Patient and family together  Visit Type Initial;Code  Referral From Nurse  Consult/Referral To Chaplain  Chaplain received page for Code Stroke. Chaplain arrived and patient nurse was assessing patient. Wife was at bedside and Chaplain introduced herself and wife thanked Clinical biochemist for coming. Chaplain maintain pastoral presence and offered wife to have her page if she needed her. Doctor was assessing patient when Chaplain left.

## 2018-05-07 NOTE — ED Notes (Signed)
Teleneuro placed in room.

## 2018-05-07 NOTE — ED Notes (Signed)
Code stroke called

## 2018-05-07 NOTE — Progress Notes (Signed)
PT Cancellation Note  Patient Details Name: Tyler Wiley MRN: [id number] DOB: 02-Oct-1945   Cancelled Treatment:    Reason Eval/Treat Not Completed: Medical issues which prohibited therapy- Patient with elevated BP, also going out for MRI shortly. Hold eval at this time, Re-attempt later or tomorrow.     Shevelle Smither 05/07/2018, 1:26 PM

## 2018-05-07 NOTE — ED Provider Notes (Addendum)
[facility] Emergency Department Provider Note    First MD Initiated Contact with Patient 05/07/18 [phone]     (approximate)  I have reviewed the triage vital signs and the nursing notes.   HISTORY  Chief Complaint Aphasia and Fall    HPI Tyler Wiley is a 73 y.o. male below listed past medical history on Plavix for history of right parietal CVA several years ago with no residual deficits presents the ER for a fascia and difficulty finding words.  Patient's initial presenting complaint was unsteady gait and word finding difficulty after mechanical fall that occurred last night.  Was brought in through triage initially concerning for head injury and trauma but states he was otherwise doing well after the fall.  Went to bed feeling well.  Denies any headache.  No blurry vision was otherwise asymptomatic.  Woke up this morning and states that around 6:00 he started feeling confused and found himself walking into different rooms and was forgetful and unsure as to why he was in the room.  Was then speaking with his wife and around 7 or 8:00 started noticing she reports that he was having difficulty completing sentences or finding words.  They presented to the ER for further evaluation.    Past Medical History:  Diagnosis Date  . Diabetes mellitus without complication ([place])   . Gastritis   . Hypertension   . Renal disorder   . Stroke [facility])    Family History  Problem Relation Age of Onset  . CAD Father    Past Surgical History:  Procedure Laterality Date  . CHOLECYSTECTOMY     Patient Active Problem List   Diagnosis Date Noted  . Stroke (cerebrum) ([place]) 05/07/2018  . Hyperkalemia 02/14/2018      Prior to Admission medications   Medication Sig Start Date End Date Taking? Authorizing Provider  carvedilol (COREG) 25 MG tablet Take 25 mg by mouth 2 (two) times daily with a meal.    Yes [provider]  clopidogrel (PLAVIX) 75 MG tablet Take 75 mg  by mouth every evening.    Yes [provider]  fluticasone (FLONASE) 50 MCG/ACT nasal spray Place 2 sprays into both nostrils daily as needed for allergies or rhinitis.    Yes [provider]  glimepiride (AMARYL) 2 MG tablet Take 2 mg by mouth daily with breakfast.   Yes [provider]  sitaGLIPtin (JANUVIA) 100 MG tablet Take 100 mg by mouth daily.   Yes [provider]  sodium zirconium cyclosilicate (LOKELMA) 10 g PACK packet Take 10 g by mouth daily. 02/15/18  Yes Fritzi Mandes, MD  torsemide (DEMADEX) 10 MG tablet Take 10 mg by mouth daily.   Yes [provider]    Allergies Patient has no known allergies.    Social History Social History   Tobacco Use  . Smoking status: Former Research scientist (life sciences)  . Smokeless tobacco: Never Used  Substance Use Topics  . Alcohol use: Yes    Comment: Social   . Drug use: Never    Review of Systems Patient denies headaches, rhinorrhea, blurry vision, numbness, shortness of breath, chest pain, edema, cough, abdominal pain, nausea, vomiting, diarrhea, dysuria, fevers, rashes or hallucinations unless otherwise stated above in HPI. ____________________________________________   PHYSICAL EXAM:  VITAL SIGNS: Vitals:   05/07/18 0930 05/07/18 1000  BP: (!) 159/94 (!) 179/91  Pulse: (!) 54 (!) 50  Resp: (!) 22 16  Temp:    SpO2: 93% 97%  Constitutional: Alert and oriented. Pleasant Eyes: Conjunctivae are normal.  Head: Atraumatic. Nose: No congestion/rhinnorhea. Mouth/Throat: Mucous membranes are moist.   Neck: No stridor. Painless ROM.  Cardiovascular: Normal rate, regular rhythm. Grossly normal heart sounds.  Good peripheral circulation. Respiratory: Normal respiratory effort.  No retractions. Lungs CTAB. Gastrointestinal: Soft and nontender. No distention. No abdominal bruits. No CVA tenderness. Genitourinary:  Musculoskeletal: No lower extremity tenderness nor edema.  No joint  effusions. Neurologic: No slurred speech but does seem to be having some word finding difficulties.  Does have difficulty with ankle to knee tracking particularly the right side.  No sensory deficits.  Normal finger-nose-finger.  Reflexes equal bilaterally.  Radial nerves otherwise intact.. No facial droop Skin:  Skin is warm, dry and intact. No rash noted. Psychiatric: Mood and affect are normal. Speech and behavior are normal.  ____________________________________________   LABS (all labs ordered are listed, but only abnormal results are displayed)  Results for orders placed or performed during the hospital encounter of 05/07/18 (from the past 24 hour(s))  CBC with Differential/Platelet     Status: Abnormal   Collection Time: 05/07/18  9:08 AM  Result Value Ref Range   WBC 6.5 4.0 - 10.5 K/uL   RBC 3.68 (L) 4.22 - 5.81 MIL/uL   Hemoglobin 10.9 (L) 13.0 - 17.0 g/dL   HCT 32.9 (L) 39.0 - 52.0 %   MCV 89.4 80.0 - 100.0 fL   MCH 29.6 26.0 - 34.0 pg   MCHC 33.1 30.0 - 36.0 g/dL   RDW 13.7 11.5 - 15.5 %   Platelets 135 (L) 150 - 400 K/uL   nRBC 0.0 0.0 - 0.2 %   Neutrophils Relative % 64 %   Neutro Abs 4.2 1.7 - 7.7 K/uL   Lymphocytes Relative 16 %   Lymphs Abs 1.1 0.7 - 4.0 K/uL   Monocytes Relative 10 %   Monocytes Absolute 0.7 0.1 - 1.0 K/uL   Eosinophils Relative 8 %   Eosinophils Absolute 0.5 0.0 - 0.5 K/uL   Basophils Relative 1 %   Basophils Absolute 0.0 0.0 - 0.1 K/uL   Immature Granulocytes 1 %   Abs Immature Granulocytes 0.03 0.00 - 0.07 K/uL  Comprehensive metabolic panel     Status: Abnormal   Collection Time: 05/07/18  9:08 AM  Result Value Ref Range   Sodium 137 135 - 145 mmol/L   Potassium 5.8 (H) 3.5 - 5.1 mmol/L   Chloride 108 98 - 111 mmol/L   CO2 23 22 - 32 mmol/L   Glucose, Bld 217 (H) 70 - 99 mg/dL   BUN 33 (H) 8 - 23 mg/dL   Creatinine, Ser 4.83 (H) 0.61 - 1.24 mg/dL   Calcium 8.3 (L) 8.9 - 10.3 mg/dL   Total Protein 6.6 6.5 - 8.1 g/dL   Albumin 3.0  (L) 3.5 - 5.0 g/dL   AST 13 (L) 15 - 41 U/L   ALT 13 0 - 44 U/L   Alkaline Phosphatase 97 38 - 126 U/L   Total Bilirubin 0.8 0.3 - 1.2 mg/dL   GFR calc non Af Amer 11 (L) >60 mL/min   GFR calc Af Amer 13 (L) >60 mL/min   Anion gap 6 5 - 15  Sample to Blood Bank     Status: None   Collection Time: 05/07/18  9:08 AM  Result Value Ref Range   Blood Bank Specimen SAMPLE AVAILABLE FOR TESTING    Sample Expiration      05/10/2018 Performed at  [facility], [place], [place] [address]   Glucose, capillary     Status: Abnormal   Collection Time: 05/07/18  9:24 AM  Result Value Ref Range   Glucose-Capillary 203 (H) 70 - 99 mg/dL   ____________________________________________  EKG My review and personal interpretation at Time: 9:07   Indication: aphasia  Rate: 55  Rhythm: sinus Axis: normal Other: borderline prolonged pr, no stemi ____________________________________________  RADIOLOGY  I personally reviewed all radiographic images ordered to evaluate for the above acute complaints and reviewed radiology reports and findings.  These findings were personally discussed with the patient.  Please see medical record for radiology report.  ____________________________________________   PROCEDURES  Procedure(s) performed:  .Critical Care Performed by: Merlyn Lot, MD Authorized by: Merlyn Lot, MD   Critical care provider statement:    Critical care time (minutes):  30   Critical care time was exclusive of:  Separately billable procedures and treating other patients   Critical care was necessary to treat or prevent imminent or life-threatening deterioration of the following conditions:  Renal failure   Critical care was time spent personally by me on the following activities:  Development of treatment plan with patient or surrogate, discussions with consultants, evaluation of patient's response to treatment, examination of patient, obtaining history  from patient or surrogate, ordering and performing treatments and interventions, ordering and review of laboratory studies, ordering and review of radiographic studies, pulse oximetry, re-evaluation of patient's condition and review of old charts      Critical Care performed: yes ____________________________________________   INITIAL IMPRESSION / [place] / ED COURSE  Pertinent labs & imaging results that were available during my care of the patient were reviewed by me and considered in my medical decision making (see chart for details).   DDX: cva, tia, hypoglycemia, dehydration, electrolyte abnormality, dissection, sepsis   Tyler Wiley is a 74 y.o. who presents to the ED with symptoms as described above.  Spent quite a deal time initial evaluating patient trying to get a sense of onset of symptoms.  Initial CT imaging ordered triage appropriately to evaluate for bleed given present symptoms shows no obvious hemorrhage.  Is having word finding difficulties.  Glucose normal.  Given his history will consult neurology as he is within window for tpa, though symptoms are mild.  Clinical Course as of May 08 1015  Sat May 07, 2018  0915 Code stroke called   [PR]  0935 Patient has been evaluated by Dr. Doy Mince and is returned to baseline more consistent with TIA.  Certainly not deficit severe enough to benefit from the risks associated with TPA.  Based on his age and risk factors patient will be admitted to the hospital for stroke work-up.  Will consult with hospitalist.   [PR]    Clinical Course User Index [PR] Merlyn Lot, MD   ----------------------------------------- 10:16 AM on 05/07/2018 -----------------------------------------  Patient also noted to have mild hyperkalemia with AKI.  Will give IV fluids as well as temporize potassium.  No EKG changes at this time.  As part of my medical decision making, I reviewed the following data within the electronic medical  record:  Nursing notes reviewed and incorporated, Labs reviewed, notes from prior ED visits and [place] Controlled Substance Database   ____________________________________________   FINAL CLINICAL IMPRESSION(S) / ED DIAGNOSES  Final diagnoses:  Aphasia  Hyperkalemia  AKI (acute kidney injury) ([place])      NEW MEDICATIONS STARTED DURING THIS VISIT:  New Prescriptions   No medications on file     Note:  This document was prepared using Dragon voice recognition software and may include unintentional dictation errors.    Merlyn Lot, MD 05/07/18 7533    Merlyn Lot, MD 05/07/18 1017

## 2018-05-07 NOTE — Progress Notes (Signed)
[facility], [place] 05/07/18  Subjective:   Patient is known to our practice from outpatient.  He is followed for chronic kidney disease.  His baseline creatinine is 3.64 from December 2019.  He presents for dysarthria.  He is being admitted for work-up of stroke.  Blood pressure is slightly elevated.  Potassium is elevated.  He has not been taking his Lokelma for the last 2 weeks.  Objective:  Vital signs in last 24 hours:  Temp:  [97.9 F (36.6 C)-98.1 F (36.7 C)] 98.1 F (36.7 C) (02/01 1109) Pulse Rate:  [50-58] 54 (02/01 1109) Resp:  [14-22] 16 (02/01 1109) BP: (159-192)/(75-107) 191/107 (02/01 1109) SpO2:  [93 %-99 %] 97 % (02/01 1109) Weight:  [113.4 kg] 113.4 kg (02/01 0847)  Weight change:  Filed Weights   05/07/18 0847  Weight: 113.4 kg    Intake/Output:   No intake or output data in the 24 hours ending 05/07/18 1111   Physical Exam: General:  Laying in the bed in the ER, no acute distress  HEENT  anicteric, moist oral mucous membranes  Neck  supple, no masses  Pulm/lungs  normal breathing effort, clear to auscultation, room air  CVS/Heart  regular rhythm  Abdomen:   Soft, nontender, obese  Extremities:  Trace edema  Neurologic:  Alert, intermittent mild dysarthria  Skin:  No acute rashes    Basic Metabolic Panel:  Recent Labs  Lab 05/07/18 0908  NA 137  K 5.8*  CL 108  CO2 23  GLUCOSE 217*  BUN 33*  CREATININE 4.83*  CALCIUM 8.3*     CBC: Recent Labs  Lab 05/07/18 0908  WBC 6.5  NEUTROABS 4.2  HGB 10.9*  HCT 32.9*  MCV 89.4  PLT 135*     No results found for: HEPBSAG, HEPBSAB, HEPBIGM    Microbiology:  No results found for this or any previous visit (from the past 240 hour(s)).  Coagulation Studies: No results for input(s): LABPROT, INR in the last 72 hours.  Urinalysis: Recent Labs    05/07/18 1001  COLORURINE STRAW*  LABSPEC 1.011  PHURINE 6.0  GLUCOSEU 150*  HGBUR NEGATIVE   BILIRUBINUR NEGATIVE  KETONESUR NEGATIVE  PROTEINUR >=300*  NITRITE NEGATIVE  LEUKOCYTESUR NEGATIVE      Imaging: Ct Head Wo Contrast  Result Date: 05/07/2018 CLINICAL DATA:  Patient on Plavix who suffered a fall on stairs yesterday. Difficulty speaking today. EXAM: CT HEAD WITHOUT CONTRAST TECHNIQUE: Contiguous axial images were obtained from the base of the skull through the vertex without intravenous contrast. COMPARISON:  Head CT scan 07/09/2014. FINDINGS: Brain: No evidence of acute infarction, hemorrhage, hydrocephalus, extra-axial collection or mass lesion/mass effect. Remote right MCA infarct noted. Vascular: No hyperdense vessel or unexpected calcification. Skull: Intact.  No focal lesion. Sinuses/Orbits: The left frontal sinus is almost completely opacified. 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 Superior aspect of the left maxillary sinus demonstrates marked mucosal thickening. Mild mucosal thickening right sphenoid sinus noted. Other: None. IMPRESSION: No acute intracranial abnormality. Remote right MCA infarct. Sinus disease. Electronically Signed   By: Inge Rise M.D.   On: 05/07/2018 09:03     Medications:   . sodium chloride     .  stroke: mapping our early stages of recovery book   Does not apply Once  . albuterol  2.5 mg Nebulization Once  . calcium gluconate  1 g Intravenous Once  . carvedilol  25 mg Oral BID WC  . clopidogrel  75 mg  Oral QPM  . enoxaparin (LOVENOX) injection  40 mg Subcutaneous Q24H  . [START ON 05/08/2018] glimepiride  2 mg Oral Q breakfast  . insulin aspart  0-9 Units Subcutaneous TID WC  . insulin aspart  5 Units Intravenous Once  . linagliptin  5 mg Oral Daily  . patiromer  8.4 g Oral Daily   acetaminophen **OR** acetaminophen (TYLENOL) oral liquid 160 mg/5 mL **OR** acetaminophen, fluticasone, hydrALAZINE  Assessment/ Plan:  73 y.o. Caucasian male With diabetes, hypertension, obstructive sleep apnea, history of nonsteroidal use,  history of gastric ulcers, chronic kidney disease stage IV, bilateral renal cysts, history of right MCA infarct in 2016  1.  Chronic kidney disease stage IV, with acute kidney injury.  Baseline creatinine 3.64 from December 2019 Agree with IV fluids to see if serum creatinine will return to baseline 2.  Hyperkalemia Restart potassium binding agent.  Agree with Veltassa. 3.  Hypertension with CKD Managed with carvedilol.  Not on ACE or ARB due to hyperkalemia.  Consider adding low-dose Norvasc 4.  Diabetes type 2 with CKD Hemoglobin A1c 7.0% from July 2019   LOS: 0 Kyndra Condron Candiss Norse 2/1/202011:11 AM  [name], [name]  Note: This note was prepared with Dragon dictation. Any transcription errors are unintentional

## 2018-05-07 NOTE — Progress Notes (Signed)
CODE STROKE- PHARMACY COMMUNICATION   Time CODE STROKE called/page received: 0912 Time response to CODE STROKE was made (in person or via phone):   Time Stroke Kit retrieved from [place] (only if needed):N/A No assistance from pharmacy needed  Name of Provider/Nurse contacted:Dr. Quentin Cornwall  Past Medical History:  Diagnosis Date  . Diabetes mellitus without complication ([place])   . Gastritis   . Hypertension   . Renal disorder   . Stroke [facility]    Prior to Admission medications   Medication Sig Start Date End Date Taking? Authorizing Provider  carvedilol (COREG) 25 MG tablet Take 25 mg by mouth 2 (two) times daily with a meal.    Yes [provider]  clopidogrel (PLAVIX) 75 MG tablet Take 75 mg by mouth every evening.    Yes [provider]  fluticasone (FLONASE) 50 MCG/ACT nasal spray Place 2 sprays into both nostrils daily as needed for allergies or rhinitis.    Yes [provider]  glimepiride (AMARYL) 2 MG tablet Take 2 mg by mouth daily with breakfast.   Yes [provider]  sitaGLIPtin (JANUVIA) 100 MG tablet Take 100 mg by mouth daily.   Yes [provider]  sodium zirconium cyclosilicate (LOKELMA) 10 g PACK packet Take 10 g by mouth daily. 02/15/18  Yes Fritzi Mandes, MD  torsemide (DEMADEX) 10 MG tablet Take 10 mg by mouth daily.   Yes [provider]     Paticia Stack, PharmD Pharmacy Resident  05/07/2018 10:40 AM

## 2018-05-07 NOTE — Progress Notes (Signed)
OT Cancellation Note  Patient Details Name: ESPIRIDION SUPINSKI MRN: [id number] DOB: [birth year]   Cancelled Treatment:    Reason Eval/Treat Not Completed: Medical issues which prohibited therapy(Pt. with elevated BP, with the most recent reading 204/90. Pt. is currently at MRI. Will continue to monitor, and perform the inital OT eval on the next available treatment date,)  Harrel Carina, MS, OTR/L 05/07/2018, 1:44 PM

## 2018-05-07 NOTE — Progress Notes (Signed)
Family Meeting Note  Advance Directive:no  Today a meeting took place with the Patient.and spouse  The following clinical team members were present during this meeting:MD  The following were discussed:Patient's diagnosis:stroke , Patient's progosis: > 12 months and Goals for treatment: Full Code  Additional follow-up to be provided: chaplain consult to create AD  Time spent during discussion 16 minutes  Aerika Groll, MD

## 2018-05-07 NOTE — Progress Notes (Signed)
   05/07/18 1000  Clinical Encounter Type  Visited With Patient and family together  Visit Type Follow-up  Referral From Nurse  Consult/Referral To Chaplain  Chaplain received OR for AD and visit patient and wife was at bedside. Chaplain asked were they interested in completing an AD and wife said that the doctor said they should. The wife of the patient is a Marine scientist and she said they are familiar with AD they just wanted a copy. Chaplain gave them and copy and ask were there any questions and wife stated no and thanked Chaplain for coming back.

## 2018-05-07 NOTE — Progress Notes (Signed)
Pt admitted for stroke workup and continues. Has occasional hesistancy in speaking desired work; no other abnormalities noted. Elevated BP responded to Hydralazine IV x 1. Awaiting meds from pharmacy.  Continuing stroke workup. Wife at bedside.

## 2018-05-07 NOTE — H&P (Signed)
[facility] NAME: Tyler Wiley    MR#:  [id number]  DATE OF BIRTH:  [birth year]  DATE OF ADMISSION:  05/07/2018  PRIMARY CARE PHYSICIAN: Kirk Ruths, MD   REQUESTING/REFERRING PHYSICIAN: Dr Quentin Cornwall  CHIEF COMPLAINT:   Speech difficulty HISTORY OF PRESENT ILLNESS:  Tyler Wiley  is a 73 y.o. male with a known history of chronic kidney disease stage III, essential hypertension, CVA and diabetes who presents to the emergency room due to difficulty with speech.  Patient reports this morning he woke up and was having some difficulty with speech.  This has been periodic since then.  He has no other focal neurological deficit.  He has been evaluated by neurology is recommending patient to be brought in for CVA work-up.  Nurse reports that he has had a few symptoms of aphasia on and off since he has been in the emergency room.  Patient did take his blood pressure medications this morning but takes his Plavix at night.    PAST MEDICAL HISTORY:   Past Medical History:  Diagnosis Date  . Diabetes mellitus without complication ([place])   . Gastritis   . Hypertension   . Renal disorder   . Stroke [facility]     PAST SURGICAL HISTORY:   Past Surgical History:  Procedure Laterality Date  . CHOLECYSTECTOMY      SOCIAL HISTORY:   Social History   Tobacco Use  . Smoking status: Former Research scientist (life sciences)  . Smokeless tobacco: Never Used  Substance Use Topics  . Alcohol use: Yes    Comment: Social     FAMILY HISTORY:   Family History  Problem Relation Age of Onset  . CAD Father     DRUG ALLERGIES:  No Known Allergies  REVIEW OF SYSTEMS:   Review of Systems  Constitutional: Negative.  Negative for chills, fever and malaise/fatigue.  HENT: Negative.  Negative for ear discharge, ear pain, hearing loss, nosebleeds and sore throat.   Eyes: Negative.  Negative for blurred vision and pain.  Respiratory: Negative.  Negative for cough, hemoptysis,  shortness of breath and wheezing.   Cardiovascular: Negative.  Negative for chest pain, palpitations and leg swelling.  Gastrointestinal: Negative.  Negative for abdominal pain, blood in stool, diarrhea, nausea and vomiting.  Genitourinary: Negative.  Negative for dysuria.  Musculoskeletal: Negative.  Negative for back pain.  Skin: Negative.   Neurological: Positive for speech change. Negative for dizziness, tremors, focal weakness, seizures and headaches.  Endo/Heme/Allergies: Negative.  Does not bruise/bleed easily.  Psychiatric/Behavioral: Negative.  Negative for depression, hallucinations and suicidal ideas.    MEDICATIONS AT HOME:   Prior to Admission medications   Medication Sig Start Date End Date Taking? Authorizing Provider  carvedilol (COREG) 25 MG tablet Take 25 mg by mouth 2 (two) times daily with a meal.    Yes [provider]  clopidogrel (PLAVIX) 75 MG tablet Take 75 mg by mouth every evening.    Yes [provider]  fluticasone (FLONASE) 50 MCG/ACT nasal spray Place 2 sprays into both nostrils daily as needed for allergies or rhinitis.    Yes [provider]  glimepiride (AMARYL) 2 MG tablet Take 2 mg by mouth daily with breakfast.   Yes [provider]  sitaGLIPtin (JANUVIA) 100 MG tablet Take 100 mg by mouth daily.   Yes [provider]  sodium zirconium cyclosilicate (LOKELMA) 10 g PACK packet Take 10 g by mouth daily. 02/15/18  Yes Posey Pronto,  Sona, MD  torsemide (DEMADEX) 10 MG tablet Take 10 mg by mouth daily.   Yes [provider]      VITAL SIGNS:  Blood pressure (!) 179/91, pulse (!) 50, temperature 97.9 F (36.6 C), temperature source Oral, resp. rate 16, height 6' (1.829 m), weight 113.4 kg, SpO2 97 %.  PHYSICAL EXAMINATION:   Physical Exam Constitutional:      General: He is not in acute distress. HENT:     Head: Normocephalic.  Eyes:     General: No scleral icterus. Neck:     Musculoskeletal: Normal  range of motion and neck supple.     Vascular: No JVD.     Trachea: No tracheal deviation.  Cardiovascular:     Rate and Rhythm: Normal rate and regular rhythm.     Heart sounds: Normal heart sounds. No murmur. No friction rub. No gallop.   Pulmonary:     Effort: Pulmonary effort is normal. No respiratory distress.     Breath sounds: Normal breath sounds. No wheezing or rales.  Chest:     Chest wall: No tenderness.  Abdominal:     General: Bowel sounds are normal. There is no distension.     Palpations: Abdomen is soft. There is no mass.     Tenderness: There is no abdominal tenderness. There is no guarding or rebound.  Musculoskeletal: Normal range of motion.  Skin:    General: Skin is warm.     Findings: No erythema or rash.  Neurological:     Mental Status: He is alert and oriented to person, place, and time.  Psychiatric:        Judgment: Judgment normal.       LABORATORY PANEL:   CBC Recent Labs  Lab 05/07/18 0908  WBC 6.5  HGB 10.9*  HCT 32.9*  PLT 135*   ------------------------------------------------------------------------------------------------------------------  Chemistries  Recent Labs  Lab 05/07/18 0908  NA 137  K 5.8*  CL 108  CO2 23  GLUCOSE 217*  BUN 33*  CREATININE 4.83*  CALCIUM 8.3*  AST 13*  ALT 13  ALKPHOS 97  BILITOT 0.8   ------------------------------------------------------------------------------------------------------------------  Cardiac Enzymes No results for input(s): TROPONINI in the last 168 hours. ------------------------------------------------------------------------------------------------------------------  RADIOLOGY:  Ct Head Wo Contrast  Result Date: 05/07/2018 CLINICAL DATA:  Patient on Plavix who suffered a fall on stairs yesterday. Difficulty speaking today. EXAM: CT HEAD WITHOUT CONTRAST TECHNIQUE: Contiguous axial images were obtained from the base of the skull through the vertex without intravenous  contrast. COMPARISON:  Head CT scan 07/09/2014. FINDINGS: Brain: No evidence of acute infarction, hemorrhage, hydrocephalus, extra-axial collection or mass lesion/mass effect. Remote right MCA infarct noted. Vascular: No hyperdense vessel or unexpected calcification. Skull: Intact.  No focal lesion. Sinuses/Orbits: The left frontal sinus is almost completely opacified. 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 Superior aspect of the left maxillary sinus demonstrates marked mucosal thickening. Mild mucosal thickening right sphenoid sinus noted. Other: None. IMPRESSION: No acute intracranial abnormality. Remote right MCA infarct. Sinus disease. Electronically Signed   By: Inge Rise M.D.   On: 05/07/2018 09:03    EKG:  Normal sinus rhythm no ST elevation or depression.  EKG is reading atrial fibrillation however I do see P waves.  IMPRESSION AND PLAN:   73 year old male with a history of diabetes, chronic kidney disease stage III, remote stroke who presents with a aphasia.  1.  Acute CVA with  aphasia: Patient symptoms have semi-resolved: Patient has been evaluated by Dr. Doy Mince  in the emergency room Continue stroke work-up including MRI/MRA brain, carotid Doppler and echocardiogram Continue Plavix PT, OT and speech consultation Check lipid panel and A1c  2.  Accelerated hypertension: Hydralazine PRN ordered Continue Coreg Hold torsemide  3.  Acute on chronic kidney disease stage III: Hold torsemide and any nephrotoxic medications Nephrology consultation requested  4.  Hyperkalemia in the setting of acute kidney injury: This has been treated in the emergency room  5.  Diabetes: Continue ADA diet with sliding scale Check A1c Oral diabetic medications renally dosed     All the records are reviewed and case discussed with ED provider. Management plans discussed with the patient and wife and they are in agreement  CODE STATUS: Full  TOTAL TIME TAKING CARE OF THIS PATIENT: 42  minutes.    Wafa Martes M.D on 05/07/2018 at 10:13 AM  Between 7am to 6pm - Pager - [phone]  After 6pm go to [email or web address] - password [email or web address] [facility]  Office  [phone]  CC: Primary care physician; Kirk Ruths, MD

## 2018-05-07 NOTE — Progress Notes (Signed)
Anticoag Monitoring: Patient is 73yo male ordered Lovenox 40mg  SQ q24h for DVT prevention.  Est.CrCl of 18 ml/min CKD stage IV  Will discontinue Lovenox and order Heparin 5000 units SQ q8h per protocol.  Paulina Fusi, PharmD, BCPS 05/07/2018 11:23 AM

## 2018-05-08 LAB — BASIC METABOLIC PANEL
Anion gap: 4 — ABNORMAL LOW (ref 5–15)
BUN: 30 mg/dL — ABNORMAL HIGH (ref 8–23)
CHLORIDE: 113 mmol/L — AB (ref 98–111)
CO2: 22 mmol/L (ref 22–32)
Calcium: 8.5 mg/dL — ABNORMAL LOW (ref 8.9–10.3)
Creatinine, Ser: 4.42 mg/dL — ABNORMAL HIGH (ref 0.61–1.24)
GFR calc non Af Amer: 12 mL/min — ABNORMAL LOW (ref >60)
GFR, EST AFRICAN AMERICAN: 14 mL/min — AB (ref >60)
Glucose, Bld: 114 mg/dL — ABNORMAL HIGH (ref 70–99)
Potassium: 5.6 mmol/L — ABNORMAL HIGH (ref 3.5–5.1)
SODIUM: 139 mmol/L (ref 135–145)

## 2018-05-08 LAB — CBC
HCT: 32.7 % — ABNORMAL LOW (ref 39.0–52.0)
HEMOGLOBIN: 10.6 g/dL — AB (ref 13.0–17.0)
MCH: 29.5 pg (ref 26.0–34.0)
MCHC: 32.4 g/dL (ref 30.0–36.0)
MCV: 91.1 fL (ref 80.0–100.0)
Platelets: 153 10*3/uL (ref 150–400)
RBC: 3.59 MIL/uL — ABNORMAL LOW (ref 4.22–5.81)
RDW: 13.8 % (ref 11.5–15.5)
WBC: 6.8 10*3/uL (ref 4.0–10.5)
nRBC: 0 % (ref 0.0–0.2)

## 2018-05-08 LAB — HEMOGLOBIN A1C
Hgb A1c MFr Bld: 6.7 % — ABNORMAL HIGH (ref 4.8–5.6)
Mean Plasma Glucose: 145.59 mg/dL

## 2018-05-08 LAB — LIPID PANEL
Cholesterol: 214 mg/dL — ABNORMAL HIGH (ref 0–200)
HDL: 29 mg/dL — ABNORMAL LOW (ref >40)
LDL Cholesterol: 131 mg/dL — ABNORMAL HIGH (ref 0–99)
Total CHOL/HDL Ratio: 7.4 RATIO
Triglycerides: 269 mg/dL — ABNORMAL HIGH (ref <150)
VLDL: 54 mg/dL — ABNORMAL HIGH (ref 0–40)

## 2018-05-08 LAB — GLUCOSE, CAPILLARY
Glucose-Capillary: 104 mg/dL — ABNORMAL HIGH (ref 70–99)
Glucose-Capillary: 130 mg/dL — ABNORMAL HIGH (ref 70–99)

## 2018-05-08 LAB — POTASSIUM: Potassium: 5.2 mmol/L — ABNORMAL HIGH (ref 3.5–5.1)

## 2018-05-08 MED ORDER — AMLODIPINE BESYLATE 10 MG PO TABS: 10.0000 mg | ORAL_TABLET | Freq: Every day | ORAL | 0 refills | Status: DC

## 2018-05-08 MED ORDER — ATORVASTATIN CALCIUM 40 MG PO TABS: 40.0000 mg | ORAL_TABLET | Freq: Every day | ORAL | 0 refills | Status: AC

## 2018-05-08 MED ORDER — HYDRALAZINE HCL 50 MG PO TABS
25.0000 mg | ORAL_TABLET | Freq: Three times a day (TID) | ORAL | Status: DC
  Administered 2018-05-08: 16:00:00 25 mg via ORAL
  Filled 2018-05-08: qty 1

## 2018-05-08 MED ORDER — HYDRALAZINE HCL 25 MG PO TABS: 25.0000 mg | ORAL_TABLET | Freq: Three times a day (TID) | ORAL | 0 refills | Status: DC

## 2018-05-08 MED ORDER — FLUTICASONE PROPIONATE 50 MCG/ACT NA SUSP: 2.0000 | Freq: Every day | NASAL | 0 refills | Status: DC

## 2018-05-08 MED ORDER — ATORVASTATIN CALCIUM 20 MG PO TABS
40.0000 mg | ORAL_TABLET | Freq: Every day | ORAL | Status: DC
  Administered 2018-05-08: 40 mg via ORAL
  Filled 2018-05-08: qty 2

## 2018-05-08 MED ORDER — PROMETHAZINE HCL 25 MG/ML IJ SOLN
12.5000 mg | Freq: Once | INTRAMUSCULAR | Status: AC
  Administered 2018-05-08: 12.5 mg via INTRAVENOUS
  Filled 2018-05-08: qty 1

## 2018-05-08 MED ORDER — AMLODIPINE BESYLATE 10 MG PO TABS
10.0000 mg | ORAL_TABLET | Freq: Every day | ORAL | Status: DC
  Administered 2018-05-08: 12:00:00 10 mg via ORAL
  Filled 2018-05-08: qty 1

## 2018-05-08 MED ORDER — ASPIRIN 81 MG PO TBEC: 81.0000 mg | DELAYED_RELEASE_TABLET | Freq: Every day | ORAL | 0 refills | Status: DC

## 2018-05-08 NOTE — Discharge Summary (Addendum)
[facility] NAME: Manual Navarra    MR#:  [id number]  DATE OF BIRTH:  November 14, 1945  DATE OF ADMISSION:  05/07/2018   ADMITTING PHYSICIAN: Bettey Costa, MD  DATE OF DISCHARGE: 05/08/18  PRIMARY CARE PHYSICIAN: Kirk Ruths, MD   ADMISSION DIAGNOSIS:  Aphasia [R47.01] Hyperkalemia [E87.5] AKI (acute kidney injury) ([place]) [N17.9] DISCHARGE DIAGNOSIS:  Active Problems:   Stroke (cerebrum) ([place])  SECONDARY DIAGNOSIS:   Past Medical History:  Diagnosis Date  . Diabetes mellitus without complication ([place])   . Gastritis   . Hypertension   . Renal disorder   . Stroke [facility])    HOSPITAL COURSE:   Abid is a 73 year old male who presented to the ED with speech difficulty.  He was admitted for stroke work-up.  Aphasia- likely secondary to TIA -Aphasia completely resolved on the day of discharge -MRI brain negative for acute stroke -Carotid Dopplers negative -Echo read pending on discharge -Seen by neurology, who recommended dual antiplatelet therapy with aspirin and Plavix -LDL 131 and patient started on Lipitor 40 mg daily  Uncontrolled hypertension- BPs improved -Continued Coreg and torsemide -Norvasc 10 mg daily and Hydralazine 25mg  tid started this admission  AKI on CKD IV- creatinine improved with fluids -Needs close outpatient nephrology follow-up  Hyperkalemia- due to AKI and patient has not been taking his home [place] for the last 3 weeks -Restart lokelma -Treated with Veltassa x2 while hospitalized -Needs potassium rechecked as an outpatient  Type 2 diabetes -A1c 6.7 this admission -Home medications continued  Hyperlipidemia- LDL 131 -Lipitor started this admission  DISCHARGE CONDITIONS:  TIA Uncontrolled hypertension CKD 4 Hyperkalemia Type 2 diabetes Hyperlipidemia CONSULTS OBTAINED:  Treatment Team:  Catarina Hartshorn, MD DRUG ALLERGIES:  No Known Allergies DISCHARGE MEDICATIONS:    Allergies as of 05/08/2018   No Known Allergies     Medication List    TAKE these medications   amLODipine 10 MG tablet Commonly known as:  NORVASC Take 1 tablet (10 mg total) by mouth daily.   aspirin 81 MG EC tablet Take 1 tablet (81 mg total) by mouth daily. Start taking on:  May 09, 2018   atorvastatin 40 MG tablet Commonly known as:  LIPITOR Take 1 tablet (40 mg total) by mouth daily at 6 PM.   carvedilol 25 MG tablet Commonly known as:  COREG Take 25 mg by mouth 2 (two) times daily with a meal.   clopidogrel 75 MG tablet Commonly known as:  PLAVIX Take 75 mg by mouth every evening.   fluticasone 50 MCG/ACT nasal spray Commonly known as:  FLONASE Place 2 sprays into both nostrils daily as needed for allergies or rhinitis.   glimepiride 2 MG tablet Commonly known as:  AMARYL Take 2 mg by mouth daily with breakfast.   hydrALAZINE 25 MG tablet Commonly known as:  APRESOLINE Take 1 tablet (25 mg total) by mouth 3 (three) times daily.   sitaGLIPtin 100 MG tablet Commonly known as:  JANUVIA Take 100 mg by mouth daily.   sodium zirconium cyclosilicate 10 g Pack packet Commonly known as:  LOKELMA Take 10 g by mouth daily.   torsemide 10 MG tablet Commonly known as:  DEMADEX Take 10 mg by mouth daily.        DISCHARGE INSTRUCTIONS:  1.  Follow-up with PCP in 5 days 2.  Follow-up with nephrology in 1 week 3.  Follow-up with neurology in 1 to 2 weeks 4.  Started Norvasc  and hydralazine this admission.  Follow blood pressures as an outpatient 5.  Neurology recommended dual antiplatelet therapy with aspirin and Plavix. 6.  Lipitor started this admission.  Recheck cholesterol panel as an outpatient. 7.  Potassium high this admission.  Needs BMP rechecked as an outpatient. DIET:  Cardiac and renal diet DISCHARGE CONDITION:  Stable ACTIVITY:  Activity as tolerated OXYGEN:  Home Oxygen: No.  Oxygen Delivery: room air DISCHARGE LOCATION:  home   If  you experience worsening of your admission symptoms, develop shortness of breath, life threatening emergency, suicidal or homicidal thoughts you must seek medical attention immediately by calling 911 or calling your MD immediately  if symptoms less severe.  You Must read complete instructions/literature along with all the possible adverse reactions/side effects for all the Medicines you take and that have been prescribed to you. Take any new Medicines after you have completely understood and accpet all the possible adverse reactions/side effects.   Please note  You were cared for by a hospitalist during your hospital stay. If you have any questions about your discharge medications or the care you received while you were in the hospital after you are discharged, you can call the unit and asked to speak with the hospitalist on call if the hospitalist that took care of you is not available. Once you are discharged, your primary care physician will handle any further medical issues. Please note that NO REFILLS for any discharge medications will be authorized once you are discharged, as it is imperative that you return to your primary care physician (or establish a relationship with a primary care physician if you do not have one) for your aftercare needs so that they can reassess your need for medications and monitor your lab values.    On the day of Discharge:  VITAL SIGNS:  Blood pressure (!) 155/75, pulse 71, temperature 97.6 F (36.4 C), temperature source Oral, resp. rate 20, height 6' (1.829 m), weight 118.1 kg, SpO2 96 %. PHYSICAL EXAMINATION:  GENERAL:  73 y.o.-year-old patient lying in the bed with no acute distress.  Well-appearing. EYES: Pupils equal, round, reactive to light and accommodation. No scleral icterus. Extraocular muscles intact.  HEENT: Head atraumatic, normocephalic. Oropharynx and nasopharynx clear.  NECK:  Supple, no jugular venous distention. No thyroid enlargement, no  tenderness.  LUNGS: Normal breath sounds bilaterally, no wheezing, rales,rhonchi or crepitation. No use of accessory muscles of respiration.  CARDIOVASCULAR: RRR, S1, S2 normal. No murmurs, rubs, or gallops.  ABDOMEN: Soft, non-tender, non-distended. Bowel sounds present. No organomegaly or mass.  EXTREMITIES: No pedal edema, cyanosis, or clubbing.  NEUROLOGIC: Cranial nerves II through XII are intact. Muscle strength 5/5 in all extremities. Sensation intact. Gait not checked.  Speech normal. PSYCHIATRIC: The patient is alert and oriented x 3.  SKIN: No obvious rash, lesion, or ulcer.  DATA REVIEW:   CBC Recent Labs  Lab 05/08/18 0753  WBC 6.8  HGB 10.6*  HCT 32.7*  PLT 153    Chemistries  Recent Labs  Lab 05/07/18 0908 05/08/18 0753  NA 137 139  K 5.8* 5.6*  CL 108 113*  CO2 23 22  GLUCOSE 217* 114*  BUN 33* 30*  CREATININE 4.83*  4.83* 4.42*  CALCIUM 8.3* 8.5*  AST 13*  --   ALT 13  --   ALKPHOS 97  --   BILITOT 0.8  --      Microbiology Results  No results found for this or any previous visit.  RADIOLOGY:  Mr Brain 15 Contrast  Result Date: 05/07/2018 CLINICAL DATA:  73 year old male with abnormal speech upon waking. EXAM: MRI HEAD WITHOUT CONTRAST MRA HEAD WITHOUT CONTRAST TECHNIQUE: Multiplanar, multiecho pulse sequences of the brain and surrounding structures were obtained without intravenous contrast. Angiographic images of the head were obtained using MRA technique without contrast. COMPARISON:  Head CT without contrast earlier today. Carotid Doppler earlier today. FINDINGS: MRI HEAD FINDINGS Brain: No restricted diffusion to suggest acute infarction. No midline shift, mass effect, evidence of mass lesion, ventriculomegaly, extra-axial collection or acute intracranial hemorrhage. Cervicomedullary junction and pituitary are within normal limits. Chronic posterior right MCA territory encephalomalacia and gliosis (series 15, image 19). Minimal to mild scattered  other nonspecific bilateral cerebral white matter T2 and FLAIR hyperintensity. No chronic cerebral blood products or other cortical encephalomalacia. Negative deep gray matter nuclei, brainstem and cerebellum. Vascular: Major intracranial vascular flow voids are preserved. Skull and upper cervical spine: Negative visible cervical spine. Visualized bone marrow signal is within normal limits. Sinuses/Orbits: Advanced left side paranasal sinus disease including a fluid level in the hyperplastic left frontal sinus. Left maxillary and ethmoid disease with inspissated material. Mild mucosal thickening and/or fluid in the sphenoid sinuses. The other right side sinuses are clear. Negative orbits. Other: Mastoids are clear. Visible internal auditory structures appear normal. Scalp and face soft tissues appear negative. MRA HEAD FINDINGS The distal vertebral arteries were not included but there is a widely patent basilar artery without stenosis. Normal SCA and PCA origins. Posterior communicating arteries are diminutive or absent. Normal bilateral PCA branches. Antegrade flow in both ICA siphons. Mild siphon irregularity. No siphon stenosis. Normal ophthalmic artery origins. Patent carotid termini. Normal MCA and ACA origins. Diminutive or absent anterior communicating artery. Visible ACA branches are within normal limits. MCA M1 segments and bi/trifurcations are patent without stenosis. Visible left MCA branches are within normal limits. Visible right MCA branches also appear within normal limits. IMPRESSION: 1. No acute intracranial abnormality. Chronic posterior right MCA infarct. 2. Negative intracranial MRA; the distal vertebral arteries were not included. 3. 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 Electronically Signed   By: Genevie Ann M.D.   On: 05/07/2018 17:47   US Carotid Bilateral (at [facility] Only)  Result Date: 05/07/2018 CLINICAL DATA:  Stroke EXAM: BILATERAL CAROTID DUPLEX ULTRASOUND  TECHNIQUE: Pearline Cables scale imaging, color Doppler and duplex ultrasound were performed of bilateral carotid and vertebral arteries in the neck. COMPARISON:  None. FINDINGS: Criteria: Quantification of carotid stenosis is based on velocity parameters that correlate the residual internal carotid diameter with NASCET-based stenosis levels, using the diameter of the distal internal carotid lumen as the denominator for stenosis measurement. The following velocity measurements were obtained: RIGHT ICA: 72 cm/sec CCA: 68 cm/sec SYSTOLIC ICA/CCA RATIO:  1.1 ECA: 103 cm/sec LEFT ICA: 63 cm/sec CCA: 70 cm/sec SYSTOLIC ICA/CCA RATIO:  0.9 ECA: 74 cm/sec RIGHT CAROTID ARTERY: Little if any plaque in the bulb. Low resistance internal carotid Doppler pattern. RIGHT VERTEBRAL ARTERY:  Antegrade. LEFT CAROTID ARTERY: Mild smooth calcified plaque in the bulb and lower internal carotid artery. Low resistance internal carotid Doppler pattern is preserved. LEFT VERTEBRAL ARTERY:  Antegrade. 1.3 cm hypoechoic nodule in the right lobe of the thyroid gland is noted. IMPRESSION: Less than 50% stenosis in the right and left internal carotid arteries. 1.3 cm right thyroid nodule. Dedicated complete thyroid ultrasound is recommended. Electronically Signed   By: Marybelle Killings M.D.   On: 05/07/2018 13:19   Mr Jodene Nam  Head/brain Wo Cm  Result Date: 05/07/2018 CLINICAL DATA:  73 year old male with abnormal speech upon waking. EXAM: MRI HEAD WITHOUT CONTRAST MRA HEAD WITHOUT CONTRAST TECHNIQUE: Multiplanar, multiecho pulse sequences of the brain and surrounding structures were obtained without intravenous contrast. Angiographic images of the head were obtained using MRA technique without contrast. COMPARISON:  Head CT without contrast earlier today. Carotid Doppler earlier today. FINDINGS: MRI HEAD FINDINGS Brain: No restricted diffusion to suggest acute infarction. No midline shift, mass effect, evidence of mass lesion, ventriculomegaly, extra-axial  collection or acute intracranial hemorrhage. Cervicomedullary junction and pituitary are within normal limits. Chronic posterior right MCA territory encephalomalacia and gliosis (series 15, image 19). Minimal to mild scattered other nonspecific bilateral cerebral white matter T2 and FLAIR hyperintensity. No chronic cerebral blood products or other cortical encephalomalacia. Negative deep gray matter nuclei, brainstem and cerebellum. Vascular: Major intracranial vascular flow voids are preserved. Skull and upper cervical spine: Negative visible cervical spine. Visualized bone marrow signal is within normal limits. Sinuses/Orbits: Advanced left side paranasal sinus disease including a fluid level in the hyperplastic left frontal sinus. Left maxillary and ethmoid disease with inspissated material. Mild mucosal thickening and/or fluid in the sphenoid sinuses. The other right side sinuses are clear. Negative orbits. Other: Mastoids are clear. Visible internal auditory structures appear normal. Scalp and face soft tissues appear negative. MRA HEAD FINDINGS The distal vertebral arteries were not included but there is a widely patent basilar artery without stenosis. Normal SCA and PCA origins. Posterior communicating arteries are diminutive or absent. Normal bilateral PCA branches. Antegrade flow in both ICA siphons. Mild siphon irregularity. No siphon stenosis. Normal ophthalmic artery origins. Patent carotid termini. Normal MCA and ACA origins. Diminutive or absent anterior communicating artery. Visible ACA branches are within normal limits. MCA M1 segments and bi/trifurcations are patent without stenosis. Visible left MCA branches are within normal limits. Visible right MCA branches also appear within normal limits. IMPRESSION: 1. No acute intracranial abnormality. Chronic posterior right MCA infarct. 2. Negative intracranial MRA; the distal vertebral arteries were not included. 3. Left-side OMC pattern obstructive  Paranasal Sinus Disease. Electronically Signed   By: Genevie Ann M.D.   On: 05/07/2018 17:47     Management plans discussed with the patient, family and they are in agreement.  CODE STATUS: Full Code   TOTAL TIME TAKING CARE OF THIS PATIENT: 40 minutes.    Berna Spare Lavera Vandermeer M.D on 05/08/2018 at 11:16 AM  Between 7am to 6pm - Pager - [phone]  After 6pm go to [email or web address] - [employer] [facility]  Office  [phone]  CC: Primary care physician; Kirk Ruths, MD   Note: This dictation was prepared with Dragon dictation along with smaller phrase technology. Any transcriptional errors that result from this process are unintentional.

## 2018-05-08 NOTE — Progress Notes (Signed)
PT Cancellation Note  Patient Details Name: Tyler Wiley MRN: [id number] DOB: March 24, 1946   Cancelled Treatment:    Reason Eval/Treat Not Completed: Other (comment)(Chart reviewed, patient with elevated potassium 5.6, PT will follow up when patient is clinically appropriate)  Shelton Silvas PT, DPT Shelton Silvas 05/08/2018, 11:53 AM

## 2018-05-08 NOTE — Evaluation (Signed)
Physical Therapy Evaluation Patient Details Name: Tyler Wiley MRN: [id number] DOB: [birth year] Today's Date: 05/08/2018   History of Present Illness  Patient is a 73 y.o. male with a known history of chronic kidney disease stage III, essential hypertension, CVA and diabetes who presents to the emergency room due to difficulty with speech.  Found to have TIA with no residual deficits. 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 Patient reports he is ind without AD at baseline, and wife is retired and able to help him at home should he need help.   Clinical Impression  Patient is ind with all basic transfers at this time. Patient does have some gait deficits (ambulating with narrow BOS) that he is able to correct to increase safety following PT cuing. Patient demonstrates safety with stair negotiation following PT cuing for proper foot clearance as well. Patient was pending DC based of PT mobility assessment. This PT agrees with MD that patient is stable from a mobility standpoint to DC home with wife. Patient and wife were given information on outpatient therapy services should patient continue to have falls or bouts of feeling unsteady in the future.     Follow Up Recommendations No PT follow up    Equipment Recommendations       Recommendations for Other Services       Precautions / Restrictions        Mobility  Bed Mobility Overal bed mobility: Independent                Transfers Overall transfer level: Independent   Transfers: Sit to/from Stand Sit to Stand: Independent         General transfer comment: Ind in all bed mobility and STS transfers with good safety awareness  Ambulation/Gait Ambulation/Gait assistance: Independent Gait Distance (Feet): 350 Feet   Gait Pattern/deviations: WFL(Within Functional Limits)   Gait velocity interpretation: >4.37 ft/sec, indicative of normal  walking speed General Gait Details: Mild narrow base of support, able to correct with cuing. Patient able to maintain ambulation without LOB with head turns (vertical and horizontal) with CGA for safety and min decreased speed noted.   Stairs Stairs: Yes Stairs assistance: Supervision Stair Management: One rail Left Number of Stairs: 5 General stair comments: Patient able to ambulate up and down stairs without need for physical assistance with PT giving cuing for foot clearance to increase safety  Wheelchair Mobility    Modified Rankin (Stroke Patients Only)       Balance Overall balance assessment: Needs assistance Sitting-balance support: Bilateral upper extremity supported;Feet unsupported Sitting balance-Leahy Scale: Normal       Standing balance-Leahy Scale: Normal   Single Leg Stance - Right Leg: 3 Single Leg Stance - Left Leg: 3 Tandem Stance - Right Leg: 10 Tandem Stance - Left Leg: 10 [place] - Eyes Opened: 10 [place]berg - Eyes Closed: 10                 Pertinent Vitals/Pain Pain Assessment: No/denies pain    Home Living Family/patient expects to be discharged to:: Private residence Living Arrangements: Spouse/significant other Available Help at Discharge: Family Type of Home: House Home Access: Stairs to enter Entrance Stairs-Rails: Left Entrance Stairs-Number of Steps: 3 Home Layout: One level Home Equipment: None Additional Comments: ind mobility prior    Prior Function Level of Independence: Independent  Hand Dominance   Dominant Hand: Right    Extremity/Trunk Assessment   Upper Extremity Assessment Upper Extremity Assessment: Overall WFL for tasks assessed    Lower Extremity Assessment Lower Extremity Assessment: Overall WFL for tasks assessed    Cervical / Trunk Assessment Cervical / Trunk Assessment: Normal  Communication   Communication: No difficulties  Cognition Arousal/Alertness: Awake/alert Behavior  During Therapy: WFL for tasks assessed/performed Overall Cognitive Status: Within Functional Limits for tasks assessed                                        General Comments      Exercises Other Exercises Other Exercises: Patient ind with transfers. PT gave cuing during ambulation to increase BOS to neutral (from narrow) which patient is able to comoply with. PT cued patient for stair safety which patient is able to compoly with to safely negotiate 5 steps. PT educated patient on role of outpatient PT should he continue to fall or feel unsteady on feet, patient and wife verbalized understanding   Assessment/Plan    PT Assessment Patent does not need any further PT services  PT Problem List         PT Treatment Interventions      PT Goals (Current goals can be found in the Care Plan section)  Acute Rehab PT Goals Patient Stated Goal: Return home PT Goal Formulation: With patient/family Time For Goal Achievement: 05/22/18 Potential to Achieve Goals: Good    Frequency     Barriers to discharge        Co-evaluation               AM-PAC PT "6 Clicks" Mobility  Outcome Measure Help needed turning from your back to your side while in a flat bed without using bedrails?: None Help needed moving from lying on your back to sitting on the side of a flat bed without using bedrails?: None Help needed moving to and from a bed to a chair (including a wheelchair)?: None Help needed standing up from a chair using your arms (e.g., wheelchair or bedside chair)?: None Help needed to walk in hospital room?: None Help needed climbing 3-5 steps with a railing? : A Little 6 Click Score: 23    End of Session Equipment Utilized During Treatment: Gait belt Activity Tolerance: Patient tolerated treatment well Patient left: in bed;with family/visitor present Nurse Communication: Mobility status      Time: 1530-1559 PT Time Calculation (min) (ACUTE ONLY): 29  min   Charges:   PT Evaluation $PT Eval Low Complexity: 1 Low PT Treatments $Therapeutic Activity: 8-22 mins        Shelton Silvas PT, DPT  Shelton Silvas 05/08/2018, 4:29 PM

## 2018-05-08 NOTE — Progress Notes (Addendum)
Subjective: Patient awake, alert and oriented x4.  Wife currently at the bedside reports no new episodes of word finding difficulty, speech disturbances or confusion.  No new strokes or strokelike symptoms reported.  Objective: Current vital signs: BP (!) 192/97 (BP Location: Right Arm)   Pulse 68   Temp 97.6 F (36.4 C) (Oral)   Resp 20   Ht 6' (1.829 m)   Wt 118.1 kg   SpO2 96%   BMI 35.31 kg/m  Vital signs in last 24 hours: Temp:  [97.5 F (36.4 C)-98.8 F (37.1 C)] 97.6 F (36.4 C) (02/02 0836) Pulse Rate:  [50-71] 68 (02/02 0836) Resp:  [14-20] 20 (02/02 0836) BP: (161-205)/(75-107) 192/97 (02/02 0836) SpO2:  [94 %-98 %] 96 % (02/02 0836) Weight:  [118.1 kg] 118.1 kg (02/01 1109)  Intake/Output from previous day: 02/01 0701 - 02/02 0700 In: 1287.1 [P.O.:240; I.V.:1047.1] Out: -  Intake/Output this shift: Total I/O In: 225.1 [I.V.:225.1] Out: -  Nutritional status:  Diet Order            Diet Carb Modified Fluid consistency: Thin; Room service appropriate? Yes  Diet effective now             Neurological Exam  Mental Status: Alert, oriented, thought content appropriate. Speech fluent without evidence of aphasia. Able to follow 3 step commands without difficulty. Attention span and concentration seemed appropriate  Cranial Nerves: II: Discs flat bilaterally; Visual fields grossly normal, pupils equal, round, reactive to light and accommodation III,IV, VI: ptosis not present, extra-ocular motions intact bilaterally V,VII: smile symmetric, facial light touch sensationintact VIII: hearing normal bilaterally IX,X: gag reflex present XI: bilateral shoulder shrug XII: midline tongue extension Motor: Right :Upper extremity 5/5Without pronator driftLeft: Upper extremity 5/5 without pronator drift Right:Lower extremity 5/5Left: Lower extremity 5/5 Tone and bulk:normal tone throughout; no atrophy  noted Sensory: Pinprick and light touchintact bilaterally Deep Tendon Reflexes: 1+ and symmetric throughout. Absent ankle jerks Plantars: Right:muteLeft: mute Cerebellar: Finger-to-nosetesting intact bilaterally.Heel to shin testing normal bilaterally Gait: not tested due to safety concerns  Data Reviewed  Lab Results: Basic Metabolic Panel: Recent Labs  Lab 05/07/18 0908 05/08/18 0753  NA 137 139  K 5.8* 5.6*  CL 108 113*  CO2 23 22  GLUCOSE 217* 114*  BUN 33* 30*  CREATININE 4.83*  4.83* 4.42*  CALCIUM 8.3* 8.5*   Liver Function Tests: Recent Labs  Lab 05/07/18 0908  AST 13*  ALT 13  ALKPHOS 97  BILITOT 0.8  PROT 6.6  ALBUMIN 3.0*   No results for input(s): LIPASE, AMYLASE in the last 168 hours. No results for input(s): AMMONIA in the last 168 hours.  CBC: Recent Labs  Lab 05/07/18 0908 05/08/18 0753  WBC 6.5 6.8  NEUTROABS 4.2  --   HGB 10.9* 10.6*  HCT 32.9* 32.7*  MCV 89.4 91.1  PLT 135* 153    Cardiac Enzymes: Recent Labs  Lab 05/07/18 0908  TROPONINI <0.03    Lipid Panel: Recent Labs  Lab 05/07/18 0908 05/08/18 0426  CHOL 202* 214*  TRIG 306* 269*  HDL 30* 29*  CHOLHDL 6.7 7.4  VLDL 61* 54*  LDLCALC 111* 131*    CBG: Recent Labs  Lab 05/07/18 0924 05/07/18 1257 05/07/18 1741 05/07/18 2050 05/08/18 0744  GLUCAP 203* 105* 85 122* 104*    Microbiology: No results found for this or any previous visit.  Coagulation Studies: Recent Labs    05/07/18 0908  LABPROT 13.0  INR 0.99    Imaging:  Ct Head Wo Contrast  Result Date: 05/07/2018 CLINICAL DATA:  Patient on Plavix who suffered a fall on stairs yesterday. Difficulty speaking today. EXAM: CT HEAD WITHOUT CONTRAST TECHNIQUE: Contiguous axial images were obtained from the base of the skull through the vertex without intravenous contrast. COMPARISON:  Head CT scan 07/09/2014. FINDINGS: Brain: No evidence of acute infarction, hemorrhage,  hydrocephalus, extra-axial collection or mass lesion/mass effect. Remote right MCA infarct noted. Vascular: No hyperdense vessel or unexpected calcification. Skull: Intact.  No focal lesion. Sinuses/Orbits: The left frontal sinus is almost completely opacified. 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 Superior aspect of the left maxillary sinus demonstrates marked mucosal thickening. Mild mucosal thickening right sphenoid sinus noted. Other: None. IMPRESSION: No acute intracranial abnormality. Remote right MCA infarct. Sinus disease. Electronically Signed   By: Inge Rise M.D.   On: 05/07/2018 09:03   Mr Brain Wo Contrast  Result Date: 05/07/2018 CLINICAL DATA:  73 year old male with abnormal speech upon waking. EXAM: MRI HEAD WITHOUT CONTRAST MRA HEAD WITHOUT CONTRAST TECHNIQUE: Multiplanar, multiecho pulse sequences of the brain and surrounding structures were obtained without intravenous contrast. Angiographic images of the head were obtained using MRA technique without contrast. COMPARISON:  Head CT without contrast earlier today. Carotid Doppler earlier today. FINDINGS: MRI HEAD FINDINGS Brain: No restricted diffusion to suggest acute infarction. No midline shift, mass effect, evidence of mass lesion, ventriculomegaly, extra-axial collection or acute intracranial hemorrhage. Cervicomedullary junction and pituitary are within normal limits. Chronic posterior right MCA territory encephalomalacia and gliosis (series 15, image 19). Minimal to mild scattered other nonspecific bilateral cerebral white matter T2 and FLAIR hyperintensity. No chronic cerebral blood products or other cortical encephalomalacia. Negative deep gray matter nuclei, brainstem and cerebellum. Vascular: Major intracranial vascular flow voids are preserved. Skull and upper cervical spine: Negative visible cervical spine. Visualized bone marrow signal is within normal limits. Sinuses/Orbits: Advanced left side paranasal sinus disease  including a fluid level in the hyperplastic left frontal sinus. Left maxillary and ethmoid disease with inspissated material. Mild mucosal thickening and/or fluid in the sphenoid sinuses. The other right side sinuses are clear. Negative orbits. Other: Mastoids are clear. Visible internal auditory structures appear normal. Scalp and face soft tissues appear negative. MRA HEAD FINDINGS The distal vertebral arteries were not included but there is a widely patent basilar artery without stenosis. Normal SCA and PCA origins. Posterior communicating arteries are diminutive or absent. Normal bilateral PCA branches. Antegrade flow in both ICA siphons. Mild siphon irregularity. No siphon stenosis. Normal ophthalmic artery origins. Patent carotid termini. Normal MCA and ACA origins. Diminutive or absent anterior communicating artery. Visible ACA branches are within normal limits. MCA M1 segments and bi/trifurcations are patent without stenosis. Visible left MCA branches are within normal limits. Visible right MCA branches also appear within normal limits. IMPRESSION: 1. No acute intracranial abnormality. Chronic posterior right MCA infarct. 2. Negative intracranial MRA; the distal vertebral arteries were not included. 3. 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 Electronically Signed   By: Genevie Ann M.D.   On: 05/07/2018 17:47   US Carotid Bilateral (at [facility] Only)  Result Date: 05/07/2018 CLINICAL DATA:  Stroke EXAM: BILATERAL CAROTID DUPLEX ULTRASOUND TECHNIQUE: Pearline Cables scale imaging, color Doppler and duplex ultrasound were performed of bilateral carotid and vertebral arteries in the neck. COMPARISON:  None. FINDINGS: Criteria: Quantification of carotid stenosis is based on velocity parameters that correlate the residual internal carotid diameter with NASCET-based stenosis levels, using the diameter of the distal internal carotid  lumen as the denominator for stenosis measurement. The following velocity  measurements were obtained: RIGHT ICA: 72 cm/sec CCA: 68 cm/sec SYSTOLIC ICA/CCA RATIO:  1.1 ECA: 103 cm/sec LEFT ICA: 63 cm/sec CCA: 70 cm/sec SYSTOLIC ICA/CCA RATIO:  0.9 ECA: 74 cm/sec RIGHT CAROTID ARTERY: Little if any plaque in the bulb. Low resistance internal carotid Doppler pattern. RIGHT VERTEBRAL ARTERY:  Antegrade. LEFT CAROTID ARTERY: Mild smooth calcified plaque in the bulb and lower internal carotid artery. Low resistance internal carotid Doppler pattern is preserved. LEFT VERTEBRAL ARTERY:  Antegrade. 1.3 cm hypoechoic nodule in the right lobe of the thyroid gland is noted. IMPRESSION: Less than 50% stenosis in the right and left internal carotid arteries. 1.3 cm right thyroid nodule. Dedicated complete thyroid ultrasound is recommended. Electronically Signed   By: Marybelle Killings M.D.   On: 05/07/2018 13:19   Mr Jodene Nam Head/brain OM Cm  Result Date: 05/07/2018 CLINICAL DATA:  73 year old male with abnormal speech upon waking. EXAM: MRI HEAD WITHOUT CONTRAST MRA HEAD WITHOUT CONTRAST TECHNIQUE: Multiplanar, multiecho pulse sequences of the brain and surrounding structures were obtained without intravenous contrast. Angiographic images of the head were obtained using MRA technique without contrast. COMPARISON:  Head CT without contrast earlier today. Carotid Doppler earlier today. FINDINGS: MRI HEAD FINDINGS Brain: No restricted diffusion to suggest acute infarction. No midline shift, mass effect, evidence of mass lesion, ventriculomegaly, extra-axial collection or acute intracranial hemorrhage. Cervicomedullary junction and pituitary are within normal limits. Chronic posterior right MCA territory encephalomalacia and gliosis (series 15, image 19). Minimal to mild scattered other nonspecific bilateral cerebral white matter T2 and FLAIR hyperintensity. No chronic cerebral blood products or other cortical encephalomalacia. Negative deep gray matter nuclei, brainstem and cerebellum. Vascular: Major  intracranial vascular flow voids are preserved. Skull and upper cervical spine: Negative visible cervical spine. Visualized bone marrow signal is within normal limits. Sinuses/Orbits: Advanced left side paranasal sinus disease including a fluid level in the hyperplastic left frontal sinus. Left maxillary and ethmoid disease with inspissated material. Mild mucosal thickening and/or fluid in the sphenoid sinuses. The other right side sinuses are clear. Negative orbits. Other: Mastoids are clear. Visible internal auditory structures appear normal. Scalp and face soft tissues appear negative. MRA HEAD FINDINGS The distal vertebral arteries were not included but there is a widely patent basilar artery without stenosis. Normal SCA and PCA origins. Posterior communicating arteries are diminutive or absent. Normal bilateral PCA branches. Antegrade flow in both ICA siphons. Mild siphon irregularity. No siphon stenosis. Normal ophthalmic artery origins. Patent carotid termini. Normal MCA and ACA origins. Diminutive or absent anterior communicating artery. Visible ACA branches are within normal limits. MCA M1 segments and bi/trifurcations are patent without stenosis. Visible left MCA branches are within normal limits. Visible right MCA branches also appear within normal limits. IMPRESSION: 1. No acute intracranial abnormality. Chronic posterior right MCA infarct. 2. Negative intracranial MRA; the distal vertebral arteries were not included. 3. 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 Electronically Signed   By: Genevie Ann M.D.   On: 05/07/2018 17:47   Medications:  I have reviewed the patient's current medications. Prior to Admission:  Medications Prior to Admission  Medication Sig Dispense Refill Last Dose  . carvedilol (COREG) 25 MG tablet Take 25 mg by mouth 2 (two) times daily with a meal.    05/07/2018 at 0700  . clopidogrel (PLAVIX) 75 MG tablet Take 75 mg by mouth every evening.    05/06/2018 at 1900   . fluticasone (FLONASE) 50  MCG/ACT nasal spray Place 2 sprays into both nostrils daily as needed for allergies or rhinitis.    Unknown at PRN  . glimepiride (AMARYL) 2 MG tablet Take 2 mg by mouth daily with breakfast.   05/07/2018 at 0700  . sitaGLIPtin (JANUVIA) 100 MG tablet Take 100 mg by mouth daily.   05/07/2018 at 0700  . sodium zirconium cyclosilicate (LOKELMA) 10 g PACK packet Take 10 g by mouth daily. 15 packet 0 Past Week at Unknown time  . torsemide (DEMADEX) 10 MG tablet Take 10 mg by mouth daily.   05/07/2018 at 0700   Scheduled: . albuterol  2.5 mg Nebulization Once  . aspirin EC  81 mg Oral Daily  . atorvastatin  40 mg Oral q1800  . carvedilol  25 mg Oral BID WC  . clopidogrel  75 mg Oral QPM  . glimepiride  2 mg Oral Q breakfast  . heparin injection (subcutaneous)  5,000 Units Subcutaneous Q8H  . insulin aspart  0-9 Units Subcutaneous TID WC  . linagliptin  5 mg Oral Daily  . patiromer  8.4 g Oral Daily   Patient seen and examined.  Clinical course and management discussed.  Necessary edits performed.  I agree with the above.  Assessment and plan of care developed and discussed below.    Assessment: 73 y.o. male with past medical history OSA, hypertension, type 2 Diabetes mellitus, stage 4 chronic kidney disease, hyperlipidemia, and right MCA stroke presenting with chief complaints of speech difficulty. Initial concerns for acute stroke. Symptoms now resolved. Etiology likely TIA.  CT head negative. Follow-up MRI brain reviewed and shows no acute intracranial abnormality, chronic posterior MCA infarct noted.  MRA brain negative for large vessel occlusion or abnormalities. US carotids bilateral shows no hemodynamically significant stenosis.  Incidental finding of 1.3 cm right thyroid nodule noted.  LDL 131. A1c 6.7.  Plan: 1. DUAP - Aspirin 81 mg/day and Plavix 75 mg /day with intensive management of vascular risk factor to keep systolic BP (SBP) <222 mm Hg (130 mm Hg if  diabetic) 2. Aggressive lipid management with goal low density lipoprotein (LDL) <70 mg/dl 3. Echocardiogram pending 4. Telemetry monitoring 5. Frequent neuro checks 6. Patient to follow up with neurology on an outpatient basis  This patient was staffed with Dr. Magda Paganini, Doy Mince who personally evaluated patient, reviewed documentation and agreed with assessment and plan of care as above.  Rufina Falco, DNP, FNP-BC Board certified Nurse Practitioner Neurology Department    LOS: 1 day    05/08/2018  10:12 AM  Alexis Goodell, MD Neurology [phone]  05/08/2018  12:55 PM

## 2018-05-08 NOTE — Progress Notes (Addendum)
Pt reported frontal HA with pressure behind his eyes with tylenol given with no change, then reports nausea with MD notified. Phenergan IV given wife reporting pt having some confusion; noted hyperactivity and restlessness, flighty speech. Reports nausea resolved with HA 2/10. Stroke workup completed with neurochecks normal; NIH-0. Hydralazine IV x 1 this a.m. for elevated BP effective; started amlodipine. Risk factors discussed with education done.  Awaiting PT/OT evaluations prior to discharge. Wife active in pt care. Recheck K + 5.2.

## 2018-05-08 NOTE — Discharge Instructions (Signed)
Acute Kidney Injury, Adult  Acute kidney injury is a sudden worsening of kidney function. The kidneys are organs that have several jobs. They filter the blood to remove waste products and extra fluid. They also maintain a healthy balance of minerals and hormones in the body, which helps control blood pressure and keep bones strong. With this condition, your kidneys do not do their jobs as well as they should. This condition ranges from mild to severe. Over time it may develop into long-lasting (chronic) kidney disease. Early detection and treatment may prevent acute kidney injury from developing into a chronic condition. What are the causes? Common causes of this condition include:  A problem with blood flow to the kidneys. This may be caused by: ? Low blood pressure (hypotension) or shock. ? Blood loss. ? Heart and blood vessel (cardiovascular) disease. ? Severe burns. ? Liver disease.  Direct damage to the kidneys. This may be caused by: ? Certain medicines. ? A kidney infection. ? Poisoning. ? Being around or in contact with toxic substances. ? A surgical wound. ? A hard, direct hit to the kidney area.  A sudden blockage of urine flow. This may be caused by: ? Cancer. ? Kidney stones. ? An enlarged prostate in males. What are the signs or symptoms? Symptoms of this condition may not be obvious until the condition becomes severe. Symptoms of this condition can include:  Tiredness (lethargy), or difficulty staying awake.  Nausea or vomiting.  Swelling (edema) of the face, legs, ankles, or feet.  Problems with urination, such as: ? Abdominal pain, or pain along the side of your stomach (flank). ? Decreased urine production. ? Decrease in the force of urine flow.  Muscle twitches and cramps, especially in the legs.  Confusion or trouble concentrating.  Loss of appetite.  Fever. How is this diagnosed? This condition may be diagnosed with tests, including:  Blood  tests.  Urine tests.  Imaging tests.  A test in which a sample of tissue is removed from the kidneys to be examined under a microscope (kidney biopsy). How is this treated? Treatment for this condition depends on the cause and how severe the condition is. In mild cases, treatment may not be needed. The kidneys may heal on their own. In more severe cases, treatment will involve:  Treating the cause of the kidney injury. This may involve changing any medicines you are taking or adjusting your dosage.  Fluids. You may need specialized IV fluids to balance your body's needs.  Having a catheter placed to drain urine and prevent blockages.  Preventing problems from occurring. This may mean avoiding certain medicines or procedures that can cause further injury to the kidneys. In some cases treatment may also require:  A procedure to remove toxic wastes from the body (dialysis or continuous renal replacement therapy - CRRT).  Surgery. This may be done to repair a torn kidney, or to remove the blockage from the urinary system. Follow these instructions at home: Medicines  Take over-the-counter and prescription medicines only as told by your health care provider.  Do not take any new medicines without your health care provider's approval. Many medicines can worsen your kidney damage.  Do not take any vitamin and mineral supplements without your health care provider's approval. Many nutritional supplements can worsen your kidney damage. Lifestyle  If your health care provider prescribed changes to your diet, follow them. You may need to decrease the amount of protein you eat.  Achieve and maintain a  healthy weight. If you need help with this, ask your health care provider.  Start or continue an exercise plan. Try to exercise at least 30 minutes a day, 5 days a week.  Do not use any tobacco products, such as cigarettes, chewing tobacco, and e-cigarettes. If you need help quitting, ask your  health care provider. General instructions  Keep track of your blood pressure. Report changes in your blood pressure as told by your health care provider.  Stay up to date with immunizations. Ask your health care provider which immunizations you need.  Keep all follow-up visits as told by your health care provider. This is important. Where to find more information  American Association of Kidney Patients: BombTimer.gl  National Kidney Foundation: www.kidney.[place]: https://mathis.com/  Life [facility]: ? www.lifeoptions.org ? www.kidneyschool.org Contact a health care provider if:  Your symptoms get worse.  You develop new symptoms. Get help right away if:  You develop symptoms of worsening kidney disease, which include: ? Headaches. ? Abnormally dark or light skin. ? Easy bruising. ? Frequent hiccups. ? Chest pain. ? Shortness of breath. ? End of menstruation in women. ? Seizures. ? Confusion or altered mental status. ? Abdominal or back pain. ? Itchiness.  You have a fever.  Your body is producing less urine.  You have pain or bleeding when you urinate. Summary  Acute kidney injury is a sudden worsening of kidney function.  Acute kidney injury can be caused by problems with blood flow to the kidneys, direct damage to the kidneys, and sudden blockage of urine flow.  Symptoms of this condition may not be obvious until it becomes severe. Symptoms may include edema, lethargy, confusion, nausea or vomiting, and problems passing urine.  This condition can usually be diagnosed with blood tests, urine tests, and imaging tests. Sometimes a kidney biopsy is done to diagnose this condition.  Treatment for this condition often involves treating the underlying cause. It is treated with fluids, medicines, dialysis, diet changes, or surgery. This information is not intended to replace advice given to you by your health care provider. Make  sure you discuss any questions you have with your health care provider. Document Released: 10/06/2010 Document Revised: 07/23/2016 Document Reviewed: 03/13/2016 Elsevier Interactive Patient Education  [employer]. It was so nice to meet you during this hospitalization!  You came into the hospital with difficulty speaking. We think you had a TIA. Your MRI did not show a stroke. The neurologist recommended taking aspirin and plavix every day. We also started a cholesterol medicine called Lipitor. Please take this daily. I have sent in a prescription for aspirin and lipitor into your pharmacy.  Your blood pressure was high, so Dr. Candiss Norse started two new blood pressure medications. Please take Norvasc 10mg  daily and Hydralazine 25mg  three times daily.  Please make sure you follow-up with the neurologist, your primary care doctor, and your kidney doctor.  Take care, Dr. Brett Albino

## 2018-05-08 NOTE — Progress Notes (Signed)
[place], [place] 05/08/18  Subjective:   Patient is known to our practice from outpatient.  He is followed for chronic kidney disease.  His baseline creatinine is 3.64 from December 2019.  He presents for dysarthria.  He is admitted for work-up of stroke. Patient is overall feeling better except headache.  His serum creatinine has improved slightly to 4.4.  Blood pressure is improving.  This morning 145/82  Objective:  Vital signs in last 24 hours:  Temp:  [97.5 F (36.4 C)-98.8 F (37.1 C)] 97.6 F (36.4 C) (02/02 0836) Pulse Rate:  [50-71] 71 (02/02 0855) Resp:  [16-20] 20 (02/02 0855) BP: (155-205)/(75-107) 155/75 (02/02 0855) SpO2:  [94 %-98 %] 96 % (02/02 0836) Weight:  [118.1 kg] 118.1 kg (02/01 1109)  Weight change:  Filed Weights   05/07/18 0847 05/07/18 1109  Weight: 113.4 kg 118.1 kg    Intake/Output:    Intake/Output Summary (Last 24 hours) at 05/08/2018 1107 Last data filed at 05/08/2018 0800 Gross per 24 hour  Intake 1512.19 ml  Output -  Net 1512.19 ml     Physical Exam: General:  Laying in the bed , no acute distress  HEENT  anicteric, moist oral mucous membranes  Neck  supple, no masses  Pulm/lungs  normal breathing effort, clear to auscultation, room air  CVS/Heart  regular rhythm  Abdomen:   Soft, nontender, obese  Extremities:  Trace edema  Neurologic:  Alert, able to answer questions appropriately  Skin:  No acute rashes    Basic Metabolic Panel:  Recent Labs  Lab 05/07/18 0908 05/08/18 0753  NA 137 139  K 5.8* 5.6*  CL 108 113*  CO2 23 22  GLUCOSE 217* 114*  BUN 33* 30*  CREATININE 4.83*  4.83* 4.42*  CALCIUM 8.3* 8.5*     CBC: Recent Labs  Lab 05/07/18 0908 05/08/18 0753  WBC 6.5 6.8  NEUTROABS 4.2  --   HGB 10.9* 10.6*  HCT 32.9* 32.7*  MCV 89.4 91.1  PLT 135* 153     No results found for: HEPBSAG, HEPBSAB, HEPBIGM    Microbiology:  No results found for this or any previous visit  (from the past 240 hour(s)).  Coagulation Studies: Recent Labs    05/07/18 0908  LABPROT 13.0  INR 0.99    Urinalysis: Recent Labs    05/07/18 1001  COLORURINE STRAW*  LABSPEC 1.011  PHURINE 6.0  GLUCOSEU 150*  HGBUR NEGATIVE  BILIRUBINUR NEGATIVE  KETONESUR NEGATIVE  PROTEINUR >=300*  NITRITE NEGATIVE  LEUKOCYTESUR NEGATIVE      Imaging: Ct Head Wo Contrast  Result Date: 05/07/2018 CLINICAL DATA:  Patient on Plavix who suffered a fall on stairs yesterday. Difficulty speaking today. EXAM: CT HEAD WITHOUT CONTRAST TECHNIQUE: Contiguous axial images were obtained from the base of the skull through the vertex without intravenous contrast. COMPARISON:  Head CT scan 07/09/2014. FINDINGS: Brain: No evidence of acute infarction, hemorrhage, hydrocephalus, extra-axial collection or mass lesion/mass effect. Remote right MCA infarct noted. Vascular: No hyperdense vessel or unexpected calcification. Skull: Intact.  No focal lesion. Sinuses/Orbits: The left frontal sinus is almost completely opacified. 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 Superior aspect of the left maxillary sinus demonstrates marked mucosal thickening. Mild mucosal thickening right sphenoid sinus noted. Other: None. IMPRESSION: No acute intracranial abnormality. Remote right MCA infarct. Sinus disease. Electronically Signed   By: Inge Rise M.D.   On: 05/07/2018 09:03   Mr Brain Wo Contrast  Result Date: 05/07/2018 CLINICAL  DATA:  73 year old male with abnormal speech upon waking. EXAM: MRI HEAD WITHOUT CONTRAST MRA HEAD WITHOUT CONTRAST TECHNIQUE: Multiplanar, multiecho pulse sequences of the brain and surrounding structures were obtained without intravenous contrast. Angiographic images of the head were obtained using MRA technique without contrast. COMPARISON:  Head CT without contrast earlier today. Carotid Doppler earlier today. FINDINGS: MRI HEAD FINDINGS Brain: No restricted diffusion to suggest acute  infarction. No midline shift, mass effect, evidence of mass lesion, ventriculomegaly, extra-axial collection or acute intracranial hemorrhage. Cervicomedullary junction and pituitary are within normal limits. Chronic posterior right MCA territory encephalomalacia and gliosis (series 15, image 19). Minimal to mild scattered other nonspecific bilateral cerebral white matter T2 and FLAIR hyperintensity. No chronic cerebral blood products or other cortical encephalomalacia. Negative deep gray matter nuclei, brainstem and cerebellum. Vascular: Major intracranial vascular flow voids are preserved. Skull and upper cervical spine: Negative visible cervical spine. Visualized bone marrow signal is within normal limits. Sinuses/Orbits: Advanced left side paranasal sinus disease including a fluid level in the hyperplastic left frontal sinus. Left maxillary and ethmoid disease with inspissated material. Mild mucosal thickening and/or fluid in the sphenoid sinuses. The other right side sinuses are clear. Negative orbits. Other: Mastoids are clear. Visible internal auditory structures appear normal. Scalp and face soft tissues appear negative. MRA HEAD FINDINGS The distal vertebral arteries were not included but there is a widely patent basilar artery without stenosis. Normal SCA and PCA origins. Posterior communicating arteries are diminutive or absent. Normal bilateral PCA branches. Antegrade flow in both ICA siphons. Mild siphon irregularity. No siphon stenosis. Normal ophthalmic artery origins. Patent carotid termini. Normal MCA and ACA origins. Diminutive or absent anterior communicating artery. Visible ACA branches are within normal limits. MCA M1 segments and bi/trifurcations are patent without stenosis. Visible left MCA branches are within normal limits. Visible right MCA branches also appear within normal limits. IMPRESSION: 1. No acute intracranial abnormality. Chronic posterior right MCA infarct. 2. Negative  intracranial MRA; the distal vertebral arteries were not included. 3. 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 Electronically Signed   By: Genevie Ann M.D.   On: 05/07/2018 17:47   US Carotid Bilateral (at [facility] Only)  Result Date: 05/07/2018 CLINICAL DATA:  Stroke EXAM: BILATERAL CAROTID DUPLEX ULTRASOUND TECHNIQUE: Pearline Cables scale imaging, color Doppler and duplex ultrasound were performed of bilateral carotid and vertebral arteries in the neck. COMPARISON:  None. FINDINGS: Criteria: Quantification of carotid stenosis is based on velocity parameters that correlate the residual internal carotid diameter with NASCET-based stenosis levels, using the diameter of the distal internal carotid lumen as the denominator for stenosis measurement. The following velocity measurements were obtained: RIGHT ICA: 72 cm/sec CCA: 68 cm/sec SYSTOLIC ICA/CCA RATIO:  1.1 ECA: 103 cm/sec LEFT ICA: 63 cm/sec CCA: 70 cm/sec SYSTOLIC ICA/CCA RATIO:  0.9 ECA: 74 cm/sec RIGHT CAROTID ARTERY: Little if any plaque in the bulb. Low resistance internal carotid Doppler pattern. RIGHT VERTEBRAL ARTERY:  Antegrade. LEFT CAROTID ARTERY: Mild smooth calcified plaque in the bulb and lower internal carotid artery. Low resistance internal carotid Doppler pattern is preserved. LEFT VERTEBRAL ARTERY:  Antegrade. 1.3 cm hypoechoic nodule in the right lobe of the thyroid gland is noted. IMPRESSION: Less than 50% stenosis in the right and left internal carotid arteries. 1.3 cm right thyroid nodule. Dedicated complete thyroid ultrasound is recommended. Electronically Signed   By: Marybelle Killings M.D.   On: 05/07/2018 13:19   Mr Jodene Nam Head/brain CV Cm  Result Date: 05/07/2018 CLINICAL DATA:  73 year old male with abnormal speech upon waking. EXAM: MRI HEAD WITHOUT CONTRAST MRA HEAD WITHOUT CONTRAST TECHNIQUE: Multiplanar, multiecho pulse sequences of the brain and surrounding structures were obtained without intravenous contrast. Angiographic  images of the head were obtained using MRA technique without contrast. COMPARISON:  Head CT without contrast earlier today. Carotid Doppler earlier today. FINDINGS: MRI HEAD FINDINGS Brain: No restricted diffusion to suggest acute infarction. No midline shift, mass effect, evidence of mass lesion, ventriculomegaly, extra-axial collection or acute intracranial hemorrhage. Cervicomedullary junction and pituitary are within normal limits. Chronic posterior right MCA territory encephalomalacia and gliosis (series 15, image 19). Minimal to mild scattered other nonspecific bilateral cerebral white matter T2 and FLAIR hyperintensity. No chronic cerebral blood products or other cortical encephalomalacia. Negative deep gray matter nuclei, brainstem and cerebellum. Vascular: Major intracranial vascular flow voids are preserved. Skull and upper cervical spine: Negative visible cervical spine. Visualized bone marrow signal is within normal limits. Sinuses/Orbits: Advanced left side paranasal sinus disease including a fluid level in the hyperplastic left frontal sinus. Left maxillary and ethmoid disease with inspissated material. Mild mucosal thickening and/or fluid in the sphenoid sinuses. The other right side sinuses are clear. Negative orbits. Other: Mastoids are clear. Visible internal auditory structures appear normal. Scalp and face soft tissues appear negative. MRA HEAD FINDINGS The distal vertebral arteries were not included but there is a widely patent basilar artery without stenosis. Normal SCA and PCA origins. Posterior communicating arteries are diminutive or absent. Normal bilateral PCA branches. Antegrade flow in both ICA siphons. Mild siphon irregularity. No siphon stenosis. Normal ophthalmic artery origins. Patent carotid termini. Normal MCA and ACA origins. Diminutive or absent anterior communicating artery. Visible ACA branches are within normal limits. MCA M1 segments and bi/trifurcations are patent without  stenosis. Visible left MCA branches are within normal limits. Visible right MCA branches also appear within normal limits. IMPRESSION: 1. No acute intracranial abnormality. Chronic posterior right MCA infarct. 2. Negative intracranial MRA; the distal vertebral arteries were not included. 3. 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 Electronically Signed   By: Genevie Ann M.D.   On: 05/07/2018 17:47     Medications:    . albuterol  2.5 mg Nebulization Once  . amLODipine  10 mg Oral Daily  . aspirin EC  81 mg Oral Daily  . atorvastatin  40 mg Oral q1800  . carvedilol  25 mg Oral BID WC  . clopidogrel  75 mg Oral QPM  . glimepiride  2 mg Oral Q breakfast  . heparin injection (subcutaneous)  5,000 Units Subcutaneous Q8H  . insulin aspart  0-9 Units Subcutaneous TID WC  . linagliptin  5 mg Oral Daily  . patiromer  8.4 g Oral Daily   acetaminophen **OR** acetaminophen (TYLENOL) oral liquid 160 mg/5 mL **OR** acetaminophen, fluticasone, hydrALAZINE, ondansetron (ZOFRAN) IV  Assessment/ Plan:  73 y.o. Caucasian male With diabetes, hypertension, obstructive sleep apnea, history of nonsteroidal use, history of gastric ulcers, chronic kidney disease stage IV, bilateral renal cysts, history of right MCA infarct in 2016  1.  Chronic kidney disease stage IV, with acute kidney injury.  Baseline creatinine 3.64 from December 2019 Serum creatinine improved We will follow-up with him as outpatient.  Appointment on February 13. 2.  Hyperkalemia Patient is not tolerating Veltassa well.  Continue Lokelma at home.  1 packet 10 gm every other day. 3.  Hypertension with CKD Managed with carvedilol.  Not on ACE or ARB due to hyperkalemia.  Agree with adding Norvasc  and low-dose hydralazine 4.  Diabetes type 2 with CKD Hemoglobin A1c 7.0% from July 2019   LOS: [place] 2/2/202011:07 [place], [place]  Note: This note was prepared  with Dragon dictation. Any transcription errors are unintentional

## 2018-05-08 NOTE — Plan of Care (Signed)
  Problem: Education: Goal: Knowledge of disease or condition will improve Outcome: Progressing Goal: Knowledge of secondary prevention will improve Outcome: Progressing Goal: Knowledge of patient specific risk factors addressed and post discharge goals established will improve Outcome: Progressing   Problem: Coping: Goal: Will verbalize positive feelings about self Outcome: Progressing Goal: Will identify appropriate support needs Outcome: Progressing   Problem: Ischemic Stroke/TIA Tissue Perfusion: Goal: Complications of ischemic stroke/TIA will be minimized Outcome: Progressing   Problem: Pain Managment: Goal: General experience of comfort will improve Outcome: Not Progressing  Pt with acute headache; PRN Tylenol given as ordered

## 2018-05-08 NOTE — Progress Notes (Signed)
PT consult done/OT consult cancelled. Pt and family state ready to go home. Pt declined supper and states he will eat at home. Oral and written AVS done-states has other meds at home with rx called in. Discharge home to self/family care when dresses. Transported in transport chair to private vehicle.

## 2018-05-09 ENCOUNTER — Emergency Department: Payer: Medicare Other

## 2018-05-09 ENCOUNTER — Observation Stay: Payer: Medicare Other

## 2018-05-09 ENCOUNTER — Observation Stay
Admission: EM | Admit: 2018-05-09 | Discharge: 2018-05-09 | Disposition: A | Discharge status: Home / Self Care | Payer: Medicare Other | Attending: Internal Medicine | Admitting: Internal Medicine

## 2018-05-09 ENCOUNTER — Other Ambulatory Visit: Payer: Self-pay

## 2018-05-09 DIAGNOSIS — R4781 Slurred speech: Secondary | ICD-10-CM | POA: Diagnosis not present

## 2018-05-09 DIAGNOSIS — W109XXA Fall (on) (from) unspecified stairs and steps, initial encounter: Secondary | ICD-10-CM | POA: Insufficient documentation

## 2018-05-09 DIAGNOSIS — Z87891 Personal history of nicotine dependence: Secondary | ICD-10-CM | POA: Insufficient documentation

## 2018-05-09 DIAGNOSIS — N184 Chronic kidney disease, stage 4 (severe): Secondary | ICD-10-CM | POA: Insufficient documentation

## 2018-05-09 DIAGNOSIS — N179 Acute kidney failure, unspecified: Secondary | ICD-10-CM | POA: Diagnosis not present

## 2018-05-09 DIAGNOSIS — I129 Hypertensive chronic kidney disease with stage 1 through stage 4 chronic kidney disease, or unspecified chronic kidney disease: Secondary | ICD-10-CM | POA: Diagnosis not present

## 2018-05-09 DIAGNOSIS — G459 Transient cerebral ischemic attack, unspecified: Secondary | ICD-10-CM | POA: Diagnosis present

## 2018-05-09 DIAGNOSIS — Z7902 Long term (current) use of antithrombotics/antiplatelets: Secondary | ICD-10-CM | POA: Diagnosis not present

## 2018-05-09 DIAGNOSIS — E1122 Type 2 diabetes mellitus with diabetic chronic kidney disease: Secondary | ICD-10-CM | POA: Diagnosis not present

## 2018-05-09 DIAGNOSIS — Z79899 Other long term (current) drug therapy: Secondary | ICD-10-CM | POA: Insufficient documentation

## 2018-05-09 DIAGNOSIS — Z7984 Long term (current) use of oral hypoglycemic drugs: Secondary | ICD-10-CM | POA: Diagnosis not present

## 2018-05-09 DIAGNOSIS — Z7982 Long term (current) use of aspirin: Secondary | ICD-10-CM | POA: Insufficient documentation

## 2018-05-09 DIAGNOSIS — E875 Hyperkalemia: Secondary | ICD-10-CM | POA: Insufficient documentation

## 2018-05-09 DIAGNOSIS — R4701 Aphasia: Principal | ICD-10-CM | POA: Insufficient documentation

## 2018-05-09 DIAGNOSIS — Z8673 Personal history of transient ischemic attack (TIA), and cerebral infarction without residual deficits: Secondary | ICD-10-CM | POA: Diagnosis not present

## 2018-05-09 DIAGNOSIS — Z8249 Family history of ischemic heart disease and other diseases of the circulatory system: Secondary | ICD-10-CM | POA: Diagnosis not present

## 2018-05-09 LAB — GLUCOSE, CAPILLARY
GLUCOSE-CAPILLARY: 82 mg/dL (ref 70–99)
GLUCOSE-CAPILLARY: 81 mg/dL (ref 70–99)
Glucose-Capillary: 86 mg/dL (ref 70–99)
Glucose-Capillary: 84 mg/dL (ref 70–99)

## 2018-05-09 LAB — URINALYSIS, ROUTINE W REFLEX MICROSCOPIC
BACTERIA UA: NONE SEEN
Bilirubin Urine: NEGATIVE
Glucose, UA: 50 mg/dL — AB
KETONES UR: NEGATIVE mg/dL
Leukocytes, UA: NEGATIVE
Nitrite: NEGATIVE
Protein, ur: 100 mg/dL — AB
Specific Gravity, Urine: 1.013 (ref 1.005–1.030)
Squamous Epithelial / HPF: NONE SEEN (ref 0–5)
pH: 7 (ref 5.0–8.0)

## 2018-05-09 LAB — CBC
HCT: 34.2 % — ABNORMAL LOW (ref 39.0–52.0)
Hemoglobin: 11.1 g/dL — ABNORMAL LOW (ref 13.0–17.0)
MCH: 29.1 pg (ref 26.0–34.0)
MCHC: 32.5 g/dL (ref 30.0–36.0)
MCV: 89.8 fL (ref 80.0–100.0)
Platelets: 191 10*3/uL (ref 150–400)
RBC: 3.81 MIL/uL — ABNORMAL LOW (ref 4.22–5.81)
RDW: 14 % (ref 11.5–15.5)
WBC: 8 10*3/uL (ref 4.0–10.5)
nRBC: 0 % (ref 0.0–0.2)

## 2018-05-09 LAB — DIFFERENTIAL
Abs Immature Granulocytes: 0.04 10*3/uL (ref 0.00–0.07)
BASOS ABS: 0 10*3/uL (ref 0.0–0.1)
Basophils Relative: 0 %
EOS ABS: 0.3 10*3/uL (ref 0.0–0.5)
Eosinophils Relative: 4 %
Immature Granulocytes: 1 %
Lymphocytes Relative: 18 %
Lymphs Abs: 1.5 10*3/uL (ref 0.7–4.0)
Monocytes Absolute: 0.8 10*3/uL (ref 0.1–1.0)
Monocytes Relative: 10 %
Neutro Abs: 5.3 10*3/uL (ref 1.7–7.7)
Neutrophils Relative %: 67 %

## 2018-05-09 LAB — APTT: aPTT: 33 seconds (ref 24–36)

## 2018-05-09 LAB — COMPREHENSIVE METABOLIC PANEL
ALT: 11 U/L (ref 0–44)
AST: 17 U/L (ref 15–41)
Albumin: 3.1 g/dL — ABNORMAL LOW (ref 3.5–5.0)
Alkaline Phosphatase: 95 U/L (ref 38–126)
Anion gap: 4 — ABNORMAL LOW (ref 5–15)
BUN: 33 mg/dL — ABNORMAL HIGH (ref 8–23)
CHLORIDE: 111 mmol/L (ref 98–111)
CO2: 22 mmol/L (ref 22–32)
Calcium: 8.4 mg/dL — ABNORMAL LOW (ref 8.9–10.3)
Creatinine, Ser: 4.57 mg/dL — ABNORMAL HIGH (ref 0.61–1.24)
GFR calc Af Amer: 14 mL/min — ABNORMAL LOW (ref >60)
GFR calc non Af Amer: 12 mL/min — ABNORMAL LOW (ref >60)
Glucose, Bld: 92 mg/dL (ref 70–99)
Potassium: 5.6 mmol/L — ABNORMAL HIGH (ref 3.5–5.1)
SODIUM: 137 mmol/L (ref 135–145)
Total Bilirubin: 0.9 mg/dL (ref 0.3–1.2)
Total Protein: 6.8 g/dL (ref 6.5–8.1)

## 2018-05-09 LAB — BASIC METABOLIC PANEL
ANION GAP: 5 (ref 5–15)
BUN: 33 mg/dL — ABNORMAL HIGH (ref 8–23)
CO2: 20 mmol/L — ABNORMAL LOW (ref 22–32)
Calcium: 8 mg/dL — ABNORMAL LOW (ref 8.9–10.3)
Chloride: 109 mmol/L (ref 98–111)
Creatinine, Ser: 4.62 mg/dL — ABNORMAL HIGH (ref 0.61–1.24)
GFR calc Af Amer: 14 mL/min — ABNORMAL LOW (ref >60)
GFR calc non Af Amer: 12 mL/min — ABNORMAL LOW (ref >60)
Glucose, Bld: 120 mg/dL — ABNORMAL HIGH (ref 70–99)
POTASSIUM: 5.6 mmol/L — AB (ref 3.5–5.1)
Sodium: 134 mmol/L — ABNORMAL LOW (ref 135–145)

## 2018-05-09 LAB — URINE DRUG SCREEN, QUALITATIVE (ARMC ONLY)
Amphetamines, Ur Screen: NOT DETECTED
Barbiturates, Ur Screen: NOT DETECTED
Benzodiazepine, Ur Scrn: NOT DETECTED
COCAINE METABOLITE, UR ~~LOC~~: NOT DETECTED
Cannabinoid 50 Ng, Ur ~~LOC~~: NOT DETECTED
MDMA (Ecstasy)Ur Screen: NOT DETECTED
Methadone Scn, Ur: NOT DETECTED
Opiate, Ur Screen: NOT DETECTED
Phencyclidine (PCP) Ur S: NOT DETECTED
Tricyclic, Ur Screen: NOT DETECTED

## 2018-05-09 LAB — ECHOCARDIOGRAM COMPLETE
Height: 72 in
Weight: 4165.81 oz

## 2018-05-09 LAB — PROTIME-INR
INR: 1.04
Prothrombin Time: 13.5 seconds (ref 11.4–15.2)

## 2018-05-09 LAB — TROPONIN I: Troponin I: <0.03 ng/mL (ref <0.03)

## 2018-05-09 LAB — ETHANOL: Alcohol, Ethyl (B): <10 mg/dL (ref <10)

## 2018-05-09 MED ORDER — PATIROMER SORBITEX CALCIUM 8.4 G PO PACK: 16.8000 g | PACK | Freq: Every day | ORAL | Status: DC

## 2018-05-09 MED ORDER — INSULIN ASPART 100 UNIT/ML ~~LOC~~ SOLN: 0.0000 [IU] | Freq: Three times a day (TID) | SUBCUTANEOUS | Status: DC

## 2018-05-09 MED ORDER — TORSEMIDE 10 MG PO TABS
10.0000 mg | ORAL_TABLET | Freq: Every day | ORAL | Status: DC
  Administered 2018-05-09: 10 mg via ORAL
  Filled 2018-05-09: qty 1

## 2018-05-09 MED ORDER — CLOPIDOGREL BISULFATE 75 MG PO TABS: 75.0000 mg | ORAL_TABLET | Freq: Every evening | ORAL | Status: DC

## 2018-05-09 MED ORDER — ASPIRIN EC 81 MG PO TBEC
81.0000 mg | DELAYED_RELEASE_TABLET | Freq: Every day | ORAL | Status: DC
  Administered 2018-05-09: 81 mg via ORAL
  Filled 2018-05-09: qty 1

## 2018-05-09 MED ORDER — HYDRALAZINE HCL 50 MG PO TABS
25.0000 mg | ORAL_TABLET | Freq: Three times a day (TID) | ORAL | Status: DC
  Administered 2018-05-09: 25 mg via ORAL
  Filled 2018-05-09: qty 1

## 2018-05-09 MED ORDER — SODIUM CHLORIDE 0.9 % IV SOLN
200.0000 mg | Freq: Once | INTRAVENOUS | Status: AC
  Administered 2018-05-09: 200 mg via INTRAVENOUS
  Filled 2018-05-09: qty 20

## 2018-05-09 MED ORDER — FLUTICASONE PROPIONATE 50 MCG/ACT NA SUSP
2.0000 | Freq: Every day | NASAL | Status: DC | PRN
  Filled 2018-05-09: qty 16

## 2018-05-09 MED ORDER — SODIUM ZIRCONIUM CYCLOSILICATE 10 G PO PACK
10.0000 g | PACK | Freq: Every day | ORAL | Status: DC
  Administered 2018-05-09: 10 g via ORAL
  Filled 2018-05-09: qty 1

## 2018-05-09 MED ORDER — ONDANSETRON HCL 4 MG/2ML IJ SOLN
INTRAMUSCULAR | Status: AC
  Filled 2018-05-09: qty 2

## 2018-05-09 MED ORDER — HYDRALAZINE HCL 20 MG/ML IJ SOLN: 5.0000 mg | Freq: Four times a day (QID) | INTRAMUSCULAR | Status: DC | PRN

## 2018-05-09 MED ORDER — STROKE: EARLY STAGES OF RECOVERY BOOK: Freq: Once | Status: DC

## 2018-05-09 MED ORDER — CARVEDILOL 25 MG PO TABS
25.0000 mg | ORAL_TABLET | Freq: Two times a day (BID) | ORAL | Status: DC
  Administered 2018-05-09: 25 mg via ORAL
  Filled 2018-05-09: qty 1

## 2018-05-09 MED ORDER — ONDANSETRON HCL 4 MG/2ML IJ SOLN: 4.0000 mg | Freq: Once | INTRAMUSCULAR | Status: DC

## 2018-05-09 MED ORDER — AMLODIPINE BESYLATE 5 MG PO TABS
10.0000 mg | ORAL_TABLET | Freq: Every day | ORAL | Status: DC
  Administered 2018-05-09: 10 mg via ORAL
  Filled 2018-05-09: qty 2

## 2018-05-09 MED ORDER — LACOSAMIDE 100 MG PO TABS: 100.0000 mg | ORAL_TABLET | Freq: Two times a day (BID) | ORAL | 0 refills | Status: DC

## 2018-05-09 MED ORDER — LACOSAMIDE 50 MG PO TABS: 100.0000 mg | ORAL_TABLET | Freq: Two times a day (BID) | ORAL | Status: DC

## 2018-05-09 MED ORDER — ONDANSETRON HCL 4 MG/2ML IJ SOLN
4.0000 mg | Freq: Once | INTRAMUSCULAR | Status: AC
  Administered 2018-05-09: 4 mg via INTRAVENOUS

## 2018-05-09 MED ORDER — ATORVASTATIN CALCIUM 20 MG PO TABS: 40.0000 mg | ORAL_TABLET | Freq: Every day | ORAL | Status: DC

## 2018-05-09 MED ORDER — CLOPIDOGREL BISULFATE 75 MG PO TABS
75.0000 mg | ORAL_TABLET | Freq: Once | ORAL | Status: AC
  Administered 2018-05-09: 75 mg via ORAL
  Filled 2018-05-09: qty 1

## 2018-05-09 MED ORDER — INSULIN ASPART 100 UNIT/ML ~~LOC~~ SOLN: 0.0000 [IU] | Freq: Every day | SUBCUTANEOUS | Status: DC

## 2018-05-09 MED ORDER — FLUTICASONE PROPIONATE 50 MCG/ACT NA SUSP: 2.0000 | Freq: Every day | NASAL | Status: DC

## 2018-05-09 MED ORDER — HEPARIN SODIUM (PORCINE) 5000 UNIT/ML IJ SOLN
5000.0000 [IU] | Freq: Two times a day (BID) | INTRAMUSCULAR | Status: DC
  Administered 2018-05-09: 5000 [IU] via SUBCUTANEOUS
  Filled 2018-05-09: qty 1

## 2018-05-09 NOTE — ED Notes (Signed)
Spouse requesting nausea medication.

## 2018-05-09 NOTE — ED Notes (Signed)
Per wife pt sounds much less slurred and words clear in comparison to when pt came in.

## 2018-05-09 NOTE — ED Notes (Signed)
Pt and spouse updated on wait for admission bed. Will attempt to secure hospital bed for comfort in emergency department.

## 2018-05-09 NOTE — ED Triage Notes (Signed)
Pt was d/c'd from hospital at 1800 May 07, 2018. Wife states approx 30 min ago pt started not talking right, words are jumbled and wife states that he couldn't figure out how to turn the tv off, pt cont to have jumbled speech at this time

## 2018-05-09 NOTE — ED Provider Notes (Signed)
[facility] Emergency Department Provider Note   ____________________________________________   First MD Initiated Contact with Patient 05/09/18 0154     (approximate)  I have reviewed the triage vital signs and the nursing notes.   HISTORY  Chief Complaint Aphasia  Level 3 caveat: Limited by aphasia; history obtained from wife who is a nurse  HPI Tyler Wiley is a 73 y.o. male who presents to the ED from home with a chief complaint of "not talking right, jumbled words, could not figure out how to turn off the TV" x30 minutes.   Patient has a prior history of CVA who was discharged from the hospital at 6 PM after a stay for aphasia/stroke.  He was admitted on 2/1 for similar symptoms of aphagia although spouse says this episode is much worse.  Underwent work-up including brain MRI which was negative for acute stroke.  He was seen by neurology who recommended dual antiplatelet therapy with aspirin and Plavix.  Patient had previously been on Plavix only.  Wife states patient had to take a Phenergan at 11 AM prior to discharge due to nausea.  After he was discharged at 6 PM, he seemed to be doing fine until 30 minutes ago when she found him fumbling with the TV unable to operate the remote because he did not know what it was.  He was aphasic with word salad.  Symptoms continue in the ED.  Denies falling or striking head.  Denies extremity weakness, numbness or tingling.   Past Medical History:  Diagnosis Date  . Diabetes mellitus without complication ([place])   . Gastritis   . Hypertension   . Renal disorder   . Stroke [facility])     Patient Active Problem List   Diagnosis Date Noted  . 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 Hyperkalemia 02/14/2018    Past Surgical History:  Procedure Laterality Date  . CHOLECYSTECTOMY      Prior to Admission medications   Medication Sig Start Date End Date Taking? Authorizing Provider  amLODipine (NORVASC) 10 MG tablet Take  1 tablet (10 mg total) by mouth daily. 05/08/18   Mayo, Pete Pelt, MD  aspirin EC 81 MG EC tablet Take 1 tablet (81 mg total) by mouth daily. 05/09/18   Mayo, Pete Pelt, MD  atorvastatin (LIPITOR) 40 MG tablet Take 1 tablet (40 mg total) by mouth daily at 6 PM. 05/08/18   Mayo, Pete Pelt, MD  carvedilol (COREG) 25 MG tablet Take 25 mg by mouth 2 (two) times daily with a meal.     [provider]  clopidogrel (PLAVIX) 75 MG tablet Take 75 mg by mouth every evening.     [provider]  fluticasone (FLONASE) 50 MCG/ACT nasal spray Place 2 sprays into both nostrils daily as needed for allergies or rhinitis.     [provider]  fluticasone (FLONASE) 50 MCG/ACT nasal spray Place 2 sprays into both nostrils daily. 05/08/18 08/06/18  Mayo, Pete Pelt, MD  glimepiride (AMARYL) 2 MG tablet Take 2 mg by mouth daily with breakfast.    [provider]  hydrALAZINE (APRESOLINE) 25 MG tablet Take 1 tablet (25 mg total) by mouth 3 (three) times daily. 05/08/18   Mayo, Pete Pelt, MD  sitaGLIPtin (JANUVIA) 100 MG tablet Take 100 mg by mouth daily.    [provider]  sodium zirconium cyclosilicate (LOKELMA) 10 g PACK packet Take 10 g by mouth daily. 02/15/18   Fritzi Mandes, MD  torsemide [facility])  10 MG tablet Take 10 mg by mouth daily.    [provider]    Allergies Patient has no known allergies.  Family History  Problem Relation Age of Onset  . CAD Father     Social History Social History   Tobacco Use  . Smoking status: Former Research scientist (life sciences)  . Smokeless tobacco: Never Used  Substance Use Topics  . Alcohol use: Yes    Comment: Social   . Drug use: Never    Review of Systems  Constitutional: No fever/chills Eyes: No visual changes. ENT: No sore throat. Cardiovascular: Denies chest pain. Respiratory: Denies shortness of breath. Gastrointestinal: No abdominal pain.  No nausea, no vomiting.  No diarrhea.  No constipation. Genitourinary: Negative for  dysuria. Musculoskeletal: Negative for back pain. Skin: Negative for rash. Neurological: Positive for aphasia.  Negative for headaches, focal weakness or numbness.   ____________________________________________   PHYSICAL EXAM:  VITAL SIGNS: ED Triage Vitals  Enc Vitals Group     BP --      Pulse Rate 05/09/18 0154 69     Resp 05/09/18 0154 20     Temp --      Temp Source 05/09/18 0154 Oral     SpO2 05/09/18 0154 98 %     Weight 05/09/18 0155 260 lb 5.8 oz (118.1 kg)     Height 05/09/18 0155 6' (1.829 m)     Head Circumference --      Peak Flow --      Pain Score 05/09/18 0155 0     Pain Loc --      Pain Edu? --      Excl. in [place]? --     Constitutional: Alert and oriented. Well appearing and in moderate acute distress. Eyes: Conjunctivae are normal. PERRL. EOMI. Head: Atraumatic. Nose: No congestion/rhinnorhea. Mouth/Throat: Mucous membranes are moist.  Oropharynx non-erythematous. Neck: No stridor.  No carotid bruits.  Supple neck without meningismus. Cardiovascular: Normal rate, regular rhythm. Grossly normal heart sounds.  Good peripheral circulation. Respiratory: Normal respiratory effort.  No retractions. Lungs CTAB. Gastrointestinal: Soft and nontender. No distention. No abdominal bruits. No CVA tenderness. Musculoskeletal: No lower extremity tenderness nor edema.  No joint effusions. Neurologic: Alert and oriented x3.  CN II-XII grossly intact.  Aphasia. No gross focal neurologic deficits are appreciated. MAEx4. Skin:  Skin is warm, dry and intact. No rash noted. Psychiatric: Mood and affect are normal. Speech and behavior are normal.  ____________________________________________   LABS (all labs ordered are listed, but only abnormal results are displayed)  Labs Reviewed  CBC - Abnormal; Notable for the following components:      Result Value   RBC 3.81 (*)    Hemoglobin 11.1 (*)    HCT 34.2 (*)    All other components within normal limits  ETHANOL   PROTIME-INR  APTT  DIFFERENTIAL  GLUCOSE, CAPILLARY  COMPREHENSIVE METABOLIC PANEL  TROPONIN I  URINE DRUG SCREEN, QUALITATIVE ([facility] ONLY)  URINALYSIS, ROUTINE W REFLEX MICROSCOPIC   ____________________________________________  EKG  ED ECG REPORT I, Livia Tarr J, the attending physician, personally viewed and interpreted this ECG.   Date: 05/09/2018  EKG Time: 0157  Rate: 67  Rhythm: normal EKG, normal sinus rhythm  Axis: Normal  Intervals:none  ST&T Change: Nonspecific  ____________________________________________  RADIOLOGY  ED MD interpretation: No ICH  Official radiology report(s): Ct Head Code Stroke Wo Contrast  Result Date: 05/09/2018 CLINICAL DATA:  Code stroke. Acute onset speech difficulties, altered mental status. History of stroke,  hypertension and diabetes, on Coumadin. EXAM: CT HEAD WITHOUT CONTRAST TECHNIQUE: Contiguous axial images were obtained from the base of the skull through the vertex without intravenous contrast. COMPARISON:  MRI of the head May 07, 2018. FINDINGS: BRAIN: No intraparenchymal hemorrhage, mass effect nor midline shift. No parenchymal brain volume loss for age. No hydrocephalus. RIGHT parietal encephalomalacia, mild ex vacuo dilatation RIGHT lateral ventricle. No acute large vascular territory infarcts. No abnormal extra-axial fluid collections. Basal cisterns are patent. VASCULAR: Unremarkable. SKULL: No skull fracture. No significant scalp soft tissue swelling. SINUSES/ORBITS: Predominantly LEFT-sided sinusitis, chronic changes LEFT maxillary sinus. The included ocular globes and orbital contents are non-suspicious. OTHER: None. ASPECTS [facility] Stroke Program Early CT Score) - Ganglionic level infarction (caudate, lentiform nuclei, internal capsule, insula, M1-M3 cortex): 7 - Supraganglionic infarction (M4-M6 cortex): 3 Total score (0-10 with 10 being normal): 10 IMPRESSION: 1. No acute intracranial process. 2. ASPECTS is 10. 3. Old RIGHT  parietal/MCA territory infarct. Otherwise negative non-contrast CT HEAD for age. 4. Critical Value/emergent results were called by telephone at the time of interpretation on 05/09/2018 at 2:15 am to Dr. Lurline Hare , who verbally acknowledged these results. Electronically Signed   By: Elon Alas M.D.   On: 05/09/2018 02:16    ____________________________________________   PROCEDURES  Procedure(s) performed:   NIH Stroke Scale  Interval: Baseline Time: 2:39 AM Person Administering Scale: Leyland Kenna J  Administer stroke scale items in the order listed. Record performance in each category after each subscale exam. Do not go back and change scores. Follow directions provided for each exam technique. Scores should reflect what the patient does, not what the clinician thinks the patient can do. The clinician should record answers while administering the exam and work quickly. Except where indicated, the patient should not be coached (i.e., repeated requests to patient to make a special effort).   1a  Level of consciousness: 0=alert; keenly responsive  1b. LOC questions:  0=Performs both tasks correctly  1c. LOC commands: 0=Performs both tasks correctly  2.  Best Gaze: 0=normal  3.  Visual: 0=No visual loss  4. Facial Palsy: 0=Normal symmetric movement  5a.  Motor left arm: 0=No drift, limb holds 90 (or 45) degrees for full 10 seconds  5b.  Motor right arm: 0=No drift, limb holds 90 (or 45) degrees for full 10 seconds  6a. motor left leg: 0=No drift, limb holds 90 (or 45) degrees for full 10 seconds  6b  Motor right leg:  0=No drift, limb holds 90 (or 45) degrees for full 10 seconds  7. Limb Ataxia: 0=Absent  8.  Sensory: 0=Normal; no sensory loss  9. Best Language:  1=Mild to moderate aphasia; some obvious loss of fluency or facility of comprehension without significant limitation on ideas expressed or form of expression.  10. Dysarthria: 0=Normal  11. Extinction and Inattention: 0=No  abnormality  12. Distal motor function: 0=Normal   Total:   1    Procedures  Critical Care performed: Yes, see critical care note(s)   CRITICAL CARE Performed by: Paulette Blanch   Total critical care time: 45 minutes  Critical care time was exclusive of separately billable procedures and treating other patients.  Critical care was necessary to treat or prevent imminent or life-threatening deterioration.  Critical care was time spent personally by me on the following activities: development of treatment plan with patient and/or surrogate as well as nursing, discussions with consultants, evaluation of patient's response to treatment, examination of patient, obtaining history from patient or  surrogate, ordering and performing treatments and interventions, ordering and review of laboratory studies, ordering and review of radiographic studies, pulse oximetry and re-evaluation of patient's condition.  ____________________________________________   INITIAL IMPRESSION / ASSESSMENT AND PLAN / ED COURSE  As part of my medical decision making, I reviewed the following data within the [place] History obtained from family, Nursing notes reviewed and incorporated, Labs reviewed, EKG interpreted, Old chart reviewed, Radiograph reviewed, Discussed with admitting physician, A consult was requested and obtained from this/these consultant(s) Neurology and Notes from prior ED visits    73 year old male with prior CVA just discharged from the hospital 8 hours ago status post admission for aphasia, found to have TIA, placed on dual antiplatelet therapy with aspirin and Plavix who returns with aphasia and word salad, worse symptoms per the wife than on prior admission.  Differential diagnosis includes but is not limited to CVA, TIA, ICH, infectious, metabolic etiologies, etc.  Onset of symptoms 30 minutes prior to arrival.  ED code stroke initiated upon patient's arrival to the treatment  room.   Clinical Course as of May 09 237  Mon May 09, 2018  0235 Discussed with tele-neurologist Dr. Sandi Mealy who recommends giving additional 75 mg dose of oral Plavix now.  Recommends hospitalization for further work-up and observation.  Does not recommend lowering blood pressure at this time.   [JS]    Clinical Course User Index [JS] Paulette Blanch, MD     ____________________________________________   FINAL CLINICAL IMPRESSION(S) / ED DIAGNOSES  Final diagnoses:  Aphasia  TIA (transient ischemic attack)     ED Discharge Orders    None       Note:  This document was prepared using Dragon voice recognition software and may include unintentional dictation errors.    Paulette Blanch, MD 05/09/18 [id number]

## 2018-05-09 NOTE — Progress Notes (Signed)
  [place] A. Merlene Laughter, MD     [email or web address]           HISTORY: The patient is 73 year old man who presents with episode of slurred speech and confusion.  The studies been done to evaluate for complex partial seizures.  MEDICATIONS: Scheduled Meds: Continuous Infusions: PRN Meds:.  Prior to Admission medications   Medication Sig Start Date End Date Taking? Authorizing Provider  amLODipine (NORVASC) 10 MG tablet Take 1 tablet (10 mg total) by mouth daily. 05/08/18   Mayo, Pete Pelt, MD  aspirin EC 81 MG EC tablet Take 1 tablet (81 mg total) by mouth daily. 05/09/18   Mayo, Pete Pelt, MD  atorvastatin (LIPITOR) 40 MG tablet Take 1 tablet (40 mg total) by mouth daily at 6 PM. 05/08/18   Mayo, Pete Pelt, MD  carvedilol (COREG) 25 MG tablet Take 25 mg by mouth 2 (two) times daily with a meal.     [provider]  clopidogrel (PLAVIX) 75 MG tablet Take 75 mg by mouth every evening.     [provider]  fluticasone (FLONASE) 50 MCG/ACT nasal spray Place 2 sprays into both nostrils daily as needed for allergies or rhinitis.     [provider]  fluticasone (FLONASE) 50 MCG/ACT nasal spray Place 2 sprays into both nostrils daily. Patient taking differently: Place 2 sprays into both nostrils daily as needed.  05/08/18 08/06/18  Mayo, Pete Pelt, MD  glimepiride (AMARYL) 2 MG tablet Take 2 mg by mouth daily with breakfast.    [provider]  hydrALAZINE (APRESOLINE) 25 MG tablet Take 1 tablet (25 mg total) by mouth 3 (three) times daily. 05/08/18   Mayo, Pete Pelt, MD  Lacosamide (VIMPAT) 100 MG TABS Take 1 tablet (100 mg total) by mouth 2 (two) times daily. 05/09/18   Merlyn Lot, MD  sitaGLIPtin (JANUVIA) 100 MG tablet Take 100 mg by mouth daily.    [provider]  sodium zirconium cyclosilicate (LOKELMA) 10 g PACK packet Take 10 g by mouth daily. 02/15/18   Fritzi Mandes, MD  torsemide (DEMADEX) 10 MG tablet Take 10 mg by mouth  daily.    [provider]      ANALYSIS: A 16 channel recording using standard 10 20 measurements is conducted for 24 minutes.  There is a well-formed posterior dominant rhythm of 8.5 Hz which attenuates with eye opening.  There is beta activity observed in the frontal areas.  Awake and drowsy activities are observed.  Photic stimulation is conducted without significant changes in the background activity.  There are a few episodes of sharp wave activity which phase reverses at T3.  There are also a few areas of generalized brief theta delta slowing most prominent in the occipital areas bilaterally.   IMPRESSION: This recording is abnormal for the following reasons: 1.  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 2.  Few areas of generalized brief theta delta slowing most prominent in the occipital areas bilaterally.      [place] A. Merlene Laughter, M.D.  Diplomate, Tax adviser of Psychiatry and Neurology ( Neurology).

## 2018-05-09 NOTE — ED Notes (Signed)
Pt back from EEG

## 2018-05-09 NOTE — ED Notes (Signed)
Pt given breakfast tray and diet coke  ?

## 2018-05-09 NOTE — ED Notes (Signed)
hospitalist in to see pt.

## 2018-05-09 NOTE — ED Notes (Signed)
Pt leaving for EEG now.

## 2018-05-09 NOTE — Progress Notes (Signed)
eeg completed ° °

## 2018-05-09 NOTE — Consult Note (Signed)
Referring Physician: Dr. Posey Pronto    Reason for Consult: Transient episode of speaking difficulties, TIA   HPI: Tyler Wiley is an 73 y.o. male with past medical history significant for chronic kidney disease, hypertension, recent admission for slurred speech to rule out stroke with a complete work-up, who presented to the hospital accompanied by his wife due to transient episode of difficulty speaking, confusion, slurred speech, and inability to get the words out.  Patient went to bed last night at 1030 normal according to his wife, then he got up around 1:30 AM, he was at the kitchen, his wife got up to check on him, she found him confused with multiple bottles of water and able to drink, his speech was slurred but he was not able to communicate, patient was not aware of what is going on, but there was no loss of consciousness, patient was not able to turn on the TV because he did not figure out how to operate the remote, there was no focal motor weakness, no chest pain, no shortness of breath, no numbness, no incontinence of the urine or stools.  Patient's wife decided to bring him to the emergency room, she checked his blood pressure and his systolic blood pressure was over 190, in the emergency room his symptoms has resolved or resolved over 2 hours, he had an MRI of the brain which shows no acute intracranial abnormality.  Patient denied having any episode like this apart from the prior episode where he was admitted to the hospital on Saturday for stroke work-up, he was discharged on Plavix and statin and he stated that he is compliant with the medication, he stated that he is compliant with the blood pressure medication but because of his kidneys his blood pressure has been difficult to control.  Date last known well: 05/09/2018  Time last known well: 01:30 AM  tPA Given: no, symptoms resolved   Past Medical History Past Medical History:  Diagnosis Date  . Diabetes mellitus without  complication ([place])   . Gastritis   . Hypertension   . Renal disorder   . Stroke [facility])     Surgical History Past Surgical History:  Procedure Laterality Date  . CHOLECYSTECTOMY      Family History  Family History  Problem Relation Age of Onset  . CAD Father     Social History:   reports that he has quit smoking. He has never used smokeless tobacco. He reports current alcohol use. He reports that he does not use drugs.  Allergies:  No Known Allergies  Home Medications:  (Not in a hospital admission)   Hospital Medications .  stroke: mapping our early stages of recovery book   Does not apply Once  . amLODipine  10 mg Oral Daily  . aspirin EC  81 mg Oral Daily  . atorvastatin  40 mg Oral q1800  . carvedilol  25 mg Oral BID WC  . clopidogrel  75 mg Oral QPM  . heparin  5,000 Units Subcutaneous Q12H  . hydrALAZINE  25 mg Oral TID  . insulin aspart  0-5 Units Subcutaneous QHS  . insulin aspart  0-9 Units Subcutaneous TID WC  . ondansetron (ZOFRAN) IV  4 mg Intravenous Once  . sodium zirconium cyclosilicate  10 g Oral Daily  . torsemide  10 mg Oral Daily    ROS: Negative except of what is reported in HPI    Physical Examination:  Vitals:   05/09/18 8242 05/09/18 0630 05/09/18 3536  05/09/18 0730  BP:  (!) 152/76 (!) 156/80 (!) 171/67  Pulse:   75 75  Resp: (!) 25 13 18 18   Temp:      TempSrc:      SpO2:   98% 98%  Weight:      Height:        General -  Heart - Regular rate and rhythm - no murmer appreciated Lungs - Clear to auscultation  Abdomen - Soft - non tender Extremities - Distal pulses intact - no edema Skin - Warm and dry   Neurologic Examination:   1A: Level of Consciousness - Alert; keenly responsive + 0 1B: Ask Month and Age - Both Questions Right + 0 1C: Blink Eyes & Squeeze Hands - Performs Both Tasks + 0 2: Test Horizontal Extraocular Movements - Normal + 0 3: Test Visual Fields - No Visual Loss + 0 4: Test Facial Palsy (Use Grimace if  Obtunded) - Normal symmetry + 0 5A: Test Left Arm Motor Drift - No Drift for 10 Seconds + 0 5B: Test Right Arm Motor Drift - No Drift for 10 Seconds + 0 6A: Test Left Leg Motor Drift - No Drift for 5 Seconds + 0 6B: Test Right Leg Motor Drift - No Drift for 5 Seconds + 0 7: Test Limb Ataxia (FNF/Heel-Shin) - No Ataxia + 0 8: Test Sensation - Normal; No sensory loss + 0 9: Test Language/Aphasia - Normal; No aphasia + 0 10: Test Dysarthria - Normal + 0 11: Test Extinction/Inattention - No abnormality + 0  NIHSS Score: 0   LABORATORY STUDIES:  Basic Metabolic Panel: Recent Labs  Lab 05/07/18 0908 05/08/18 0753 05/08/18 1341 05/09/18 0202  NA 137 139  --  137  K 5.8* 5.6* 5.2* 5.6*  CL 108 113*  --  111  CO2 23 22  --  22  GLUCOSE 217* 114*  --  92  BUN 33* 30*  --  33*  CREATININE 4.83*  4.83* 4.42*  --  4.57*  CALCIUM 8.3* 8.5*  --  8.4*    Liver Function Tests: Recent Labs  Lab 05/07/18 0908 05/09/18 0202  AST 13* 17  ALT 13 11  ALKPHOS 97 95  BILITOT 0.8 0.9  PROT 6.6 6.8  ALBUMIN 3.0* 3.1*   No results for input(s): LIPASE, AMYLASE in the last 168 hours. No results for input(s): AMMONIA in the last 168 hours.  CBC: Recent Labs  Lab 05/07/18 0908 05/08/18 0753 05/09/18 0202  WBC 6.5 6.8 8.0  NEUTROABS 4.2  --  5.3  HGB 10.9* 10.6* 11.1*  HCT 32.9* 32.7* 34.2*  MCV 89.4 91.1 89.8  PLT 135* 153 191    Cardiac Enzymes: Recent Labs  Lab 05/07/18 0908 05/09/18 0202  TROPONINI <0.03 <0.03    BNP: Invalid input(s): POCBNP  CBG: Recent Labs  Lab 05/08/18 0744 05/08/18 1127 05/09/18 0228 05/09/18 0433 05/09/18 0756  GLUCAP 104* 130* 82 86 81    Microbiology:   Coagulation Studies: Recent Labs    05/07/18 0908 05/09/18 0202  LABPROT 13.0 13.5  INR 0.99 1.04    Urinalysis:  Recent Labs  Lab 05/07/18 1001 05/09/18 0509  COLORURINE STRAW* YELLOW*  LABSPEC 1.011 1.013  PHURINE 6.0 7.0  GLUCOSEU 150* 50*  HGBUR NEGATIVE  SMALL*  BILIRUBINUR NEGATIVE NEGATIVE  KETONESUR NEGATIVE NEGATIVE  PROTEINUR >=300* 100*  NITRITE NEGATIVE NEGATIVE  LEUKOCYTESUR NEGATIVE NEGATIVE    Lipid Panel:     Component Value Date/Time  CHOL 214 (H) 05/08/2018 0426   TRIG 269 (H) 05/08/2018 0426   HDL 29 (L) 05/08/2018 0426   CHOLHDL 7.4 05/08/2018 0426   VLDL 54 (H) 05/08/2018 0426   LDLCALC 131 (H) 05/08/2018 0426    HgbA1C:  Lab Results  Component Value Date   HGBA1C 6.7 (H) 05/08/2018    Urine Drug Screen:      Component Value Date/Time   LABOPIA NONE DETECTED 05/09/2018 0509   COCAINSCRNUR NONE DETECTED 05/09/2018 0509   LABBENZ NONE DETECTED 05/09/2018 0509   AMPHETMU NONE DETECTED 05/09/2018 0509   THCU NONE DETECTED 05/09/2018 0509   LABBARB NONE DETECTED 05/09/2018 0509     Alcohol Level:  Recent Labs  Lab 05/09/18 0202  ETH <10    Miscellaneous labs:  EKG  EKG   IMAGING: Ct Head Wo Contrast  Result Date: 05/07/2018 CLINICAL DATA:  Patient on Plavix who suffered a fall on stairs yesterday. Difficulty speaking today. EXAM: CT HEAD WITHOUT CONTRAST TECHNIQUE: Contiguous axial images were obtained from the base of the skull through the vertex without intravenous contrast. COMPARISON:  Head CT scan 07/09/2014. FINDINGS: Brain: No evidence of acute infarction, hemorrhage, hydrocephalus, extra-axial collection or mass lesion/mass effect. Remote right MCA infarct noted. Vascular: No hyperdense vessel or unexpected calcification. Skull: Intact.  No focal lesion. Sinuses/Orbits: The left frontal sinus is almost completely opacified. 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 Superior aspect of the left maxillary sinus demonstrates marked mucosal thickening. Mild mucosal thickening right sphenoid sinus noted. Other: None. IMPRESSION: No acute intracranial abnormality. Remote right MCA infarct. Sinus disease. Electronically Signed   By: Inge Rise M.D.   On: 05/07/2018 09:03   Mr Brain Wo  Contrast  Result Date: 05/07/2018 CLINICAL DATA:  73 year old male with abnormal speech upon waking. EXAM: MRI HEAD WITHOUT CONTRAST MRA HEAD WITHOUT CONTRAST TECHNIQUE: Multiplanar, multiecho pulse sequences of the brain and surrounding structures were obtained without intravenous contrast. Angiographic images of the head were obtained using MRA technique without contrast. COMPARISON:  Head CT without contrast earlier today. Carotid Doppler earlier today. FINDINGS: MRI HEAD FINDINGS Brain: No restricted diffusion to suggest acute infarction. No midline shift, mass effect, evidence of mass lesion, ventriculomegaly, extra-axial collection or acute intracranial hemorrhage. Cervicomedullary junction and pituitary are within normal limits. Chronic posterior right MCA territory encephalomalacia and gliosis (series 15, image 19). Minimal to mild scattered other nonspecific bilateral cerebral white matter T2 and FLAIR hyperintensity. No chronic cerebral blood products or other cortical encephalomalacia. Negative deep gray matter nuclei, brainstem and cerebellum. Vascular: Major intracranial vascular flow voids are preserved. Skull and upper cervical spine: Negative visible cervical spine. Visualized bone marrow signal is within normal limits. Sinuses/Orbits: Advanced left side paranasal sinus disease including a fluid level in the hyperplastic left frontal sinus. Left maxillary and ethmoid disease with inspissated material. Mild mucosal thickening and/or fluid in the sphenoid sinuses. The other right side sinuses are clear. Negative orbits. Other: Mastoids are clear. Visible internal auditory structures appear normal. Scalp and face soft tissues appear negative. MRA HEAD FINDINGS The distal vertebral arteries were not included but there is a widely patent basilar artery without stenosis. Normal SCA and PCA origins. Posterior communicating arteries are diminutive or absent. Normal bilateral PCA branches. Antegrade flow in  both ICA siphons. Mild siphon irregularity. No siphon stenosis. Normal ophthalmic artery origins. Patent carotid termini. Normal MCA and ACA origins. Diminutive or absent anterior communicating artery. Visible ACA branches are within normal limits. MCA M1 segments and bi/trifurcations are  patent without stenosis. Visible left MCA branches are within normal limits. Visible right MCA branches also appear within normal limits. IMPRESSION: 1. No acute intracranial abnormality. Chronic posterior right MCA infarct. 2. Negative intracranial MRA; the distal vertebral arteries were not included. 3. 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 Electronically Signed   By: Genevie Ann M.D.   On: 05/07/2018 17:47   US Carotid Bilateral (at [facility] Only)  Result Date: 05/07/2018 CLINICAL DATA:  Stroke EXAM: BILATERAL CAROTID DUPLEX ULTRASOUND TECHNIQUE: Pearline Cables scale imaging, color Doppler and duplex ultrasound were performed of bilateral carotid and vertebral arteries in the neck. COMPARISON:  None. FINDINGS: Criteria: Quantification of carotid stenosis is based on velocity parameters that correlate the residual internal carotid diameter with NASCET-based stenosis levels, using the diameter of the distal internal carotid lumen as the denominator for stenosis measurement. The following velocity measurements were obtained: RIGHT ICA: 72 cm/sec CCA: 68 cm/sec SYSTOLIC ICA/CCA RATIO:  1.1 ECA: 103 cm/sec LEFT ICA: 63 cm/sec CCA: 70 cm/sec SYSTOLIC ICA/CCA RATIO:  0.9 ECA: 74 cm/sec RIGHT CAROTID ARTERY: Little if any plaque in the bulb. Low resistance internal carotid Doppler pattern. RIGHT VERTEBRAL ARTERY:  Antegrade. LEFT CAROTID ARTERY: Mild smooth calcified plaque in the bulb and lower internal carotid artery. Low resistance internal carotid Doppler pattern is preserved. LEFT VERTEBRAL ARTERY:  Antegrade. 1.3 cm hypoechoic nodule in the right lobe of the thyroid gland is noted. IMPRESSION: Less than 50% stenosis  in the right and left internal carotid arteries. 1.3 cm right thyroid nodule. Dedicated complete thyroid ultrasound is recommended. Electronically Signed   By: Marybelle Killings M.D.   On: 05/07/2018 13:19   Mr Jodene Nam Head/brain NI Cm  Result Date: 05/07/2018 CLINICAL DATA:  73 year old male with abnormal speech upon waking. EXAM: MRI HEAD WITHOUT CONTRAST MRA HEAD WITHOUT CONTRAST TECHNIQUE: Multiplanar, multiecho pulse sequences of the brain and surrounding structures were obtained without intravenous contrast. Angiographic images of the head were obtained using MRA technique without contrast. COMPARISON:  Head CT without contrast earlier today. Carotid Doppler earlier today. FINDINGS: MRI HEAD FINDINGS Brain: No restricted diffusion to suggest acute infarction. No midline shift, mass effect, evidence of mass lesion, ventriculomegaly, extra-axial collection or acute intracranial hemorrhage. Cervicomedullary junction and pituitary are within normal limits. Chronic posterior right MCA territory encephalomalacia and gliosis (series 15, image 19). Minimal to mild scattered other nonspecific bilateral cerebral white matter T2 and FLAIR hyperintensity. No chronic cerebral blood products or other cortical encephalomalacia. Negative deep gray matter nuclei, brainstem and cerebellum. Vascular: Major intracranial vascular flow voids are preserved. Skull and upper cervical spine: Negative visible cervical spine. Visualized bone marrow signal is within normal limits. Sinuses/Orbits: Advanced left side paranasal sinus disease including a fluid level in the hyperplastic left frontal sinus. Left maxillary and ethmoid disease with inspissated material. Mild mucosal thickening and/or fluid in the sphenoid sinuses. The other right side sinuses are clear. Negative orbits. Other: Mastoids are clear. Visible internal auditory structures appear normal. Scalp and face soft tissues appear negative. MRA HEAD FINDINGS The distal vertebral  arteries were not included but there is a widely patent basilar artery without stenosis. Normal SCA and PCA origins. Posterior communicating arteries are diminutive or absent. Normal bilateral PCA branches. Antegrade flow in both ICA siphons. Mild siphon irregularity. No siphon stenosis. Normal ophthalmic artery origins. Patent carotid termini. Normal MCA and ACA origins. Diminutive or absent anterior communicating artery. Visible ACA branches are within normal limits. MCA M1 segments and bi/trifurcations are patent  without stenosis. Visible left MCA branches are within normal limits. Visible right MCA branches also appear within normal limits. IMPRESSION: 1. No acute intracranial abnormality. Chronic posterior right MCA infarct. 2. Negative intracranial MRA; the distal vertebral arteries were not included. 3. 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 Electronically Signed   By: Genevie Ann M.D.   On: 05/07/2018 17:47   Ct Head Code Stroke Wo Contrast  Result Date: 05/09/2018 CLINICAL DATA:  Code stroke. Acute onset speech difficulties, altered mental status. History of stroke, hypertension and diabetes, on Coumadin. EXAM: CT HEAD WITHOUT CONTRAST TECHNIQUE: Contiguous axial images were obtained from the base of the skull through the vertex without intravenous contrast. COMPARISON:  MRI of the head May 07, 2018. FINDINGS: BRAIN: No intraparenchymal hemorrhage, mass effect nor midline shift. No parenchymal brain volume loss for age. No hydrocephalus. RIGHT parietal encephalomalacia, mild ex vacuo dilatation RIGHT lateral ventricle. No acute large vascular territory infarcts. No abnormal extra-axial fluid collections. Basal cisterns are patent. VASCULAR: Unremarkable. SKULL: No skull fracture. No significant scalp soft tissue swelling. SINUSES/ORBITS: Predominantly LEFT-sided sinusitis, chronic changes LEFT maxillary sinus. The included ocular globes and orbital contents are non-suspicious. OTHER:  None. ASPECTS [facility] Stroke Program Early CT Score) - Ganglionic level infarction (caudate, lentiform nuclei, internal capsule, insula, M1-M3 cortex): 7 - Supraganglionic infarction (M4-M6 cortex): 3 Total score (0-10 with 10 being normal): 10 IMPRESSION: 1. No acute intracranial process. 2. ASPECTS is 10. 3. Old RIGHT parietal/MCA territory infarct. Otherwise negative non-contrast CT HEAD for age. 4. Critical Value/emergent results were called by telephone at the time of interpretation on 05/09/2018 at 2:15 am to Dr. Lurline Hare , who verbally acknowledged these results. Electronically Signed   By: Elon Alas M.D.   On: 05/09/2018 02:16      Assessment: 73 y.o. male with past medical history significant for hypertension chronic kidney disease and recent admission for slurred speech to rule out stroke presents to the hospital again with a transient episode of confusion and inability to speak. Differential diagnosis is TIA, hypertensive urgency, partial seizures.     -MRI of the brain reviewed and showed no acute intracranial abnormality -CT scan of the brain reviewed and shows encephalomalacia and old right MCA stroke -MRA of head shows no intracranial large vessel occlusion -Carotid ultrasound shows less than 50% bilateral carotid stenosis -Continue Plavix and statin -Blood pressure control -We will give 1 dose of Vimpat 200 mg IV now and then start Vimpat 100 mg bid po for the possibility of seizures, we will also obtain an EEG in the emergency room and patient is to follow-up with outpatient neurology. -We will get MRA of the carotid as an outpatient for better evaluation of his carotid. -If patient is cleared by medicine and physical therapy he can be discharged home from the neuro standpoint. -Plan discussed in details with the patient's and his wife at bedside and also with the emergency room staff.     Arnaldo Natal, MD

## 2018-05-09 NOTE — H&P (Signed)
[name] NAME: Tyler Wiley    MR#:  [id number]  DATE OF BIRTH:  [birth year]  DATE OF ADMISSION:  05/09/2018  PRIMARY CARE PHYSICIAN: Kirk Ruths, MD   REQUESTING/REFERRING PHYSICIAN: Dr. Beather Arbour  CHIEF COMPLAINT:   Chief Complaint  Patient presents with  . Aphasia    HISTORY OF PRESENT ILLNESS:  Tyler Wiley  is a 73 y.o. male with a known history listed below presented to emergency room for evaluation of word finding difficulties.  Patient was admitted for  aphasia and stroke evaluation 2 days ago.  Patient was discharged from hospital yesterday.  At home patient started having word finding difficulties around 1:30 AM.  Patient denies weakness or numbness.  No slurring of speech.  Patient is able to speak but not able to find correct words.  Denies headache or dizziness.  No other complaints.  In emergency room patient underwent CT head that did not show any acute process.  Patient has recent stroke work-up done day before yesterday.  ER physician has discussed case with tele- neurology who recommended high risk TIA work-up and admission to hospital.  Hospitalist team requested for admission.  PAST MEDICAL HISTORY:   Past Medical History:  Diagnosis Date  . Diabetes mellitus without complication ([place])   . Gastritis   . Hypertension   . Renal disorder   . Stroke [facility])     PAST SURGICAL HISTORY:   Past Surgical History:  Procedure Laterality Date  . CHOLECYSTECTOMY      SOCIAL HISTORY:   Social History   Tobacco Use  . Smoking status: Former Research scientist (life sciences)  . Smokeless tobacco: Never Used  Substance Use Topics  . Alcohol use: Yes    Comment: Social     FAMILY HISTORY:   Family History  Problem Relation Age of Onset  . CAD Father     DRUG ALLERGIES:  No Known Allergies  REVIEW OF SYSTEMS:   ROS -12 point review of system reviewed, positive as per HPI otherwise negative.  MEDICATIONS AT HOME:   Prior  to Admission medications   Medication Sig Start Date End Date Taking? Authorizing Provider  amLODipine (NORVASC) 10 MG tablet Take 1 tablet (10 mg total) by mouth daily. 05/08/18  Yes Mayo, Pete Pelt, MD  aspirin EC 81 MG EC tablet Take 1 tablet (81 mg total) by mouth daily. 05/09/18  Yes Mayo, Pete Pelt, MD  atorvastatin (LIPITOR) 40 MG tablet Take 1 tablet (40 mg total) by mouth daily at 6 PM. 05/08/18  Yes Mayo, Pete Pelt, MD  carvedilol (COREG) 25 MG tablet Take 25 mg by mouth 2 (two) times daily with a meal.    Yes [provider]  clopidogrel (PLAVIX) 75 MG tablet Take 75 mg by mouth every evening.    Yes [provider]  fluticasone (FLONASE) 50 MCG/ACT nasal spray Place 2 sprays into both nostrils daily. Patient taking differently: Place 2 sprays into both nostrils daily as needed.  05/08/18 08/06/18 Yes Mayo, Pete Pelt, MD  glimepiride (AMARYL) 2 MG tablet Take 2 mg by mouth daily with breakfast.   Yes [provider]  hydrALAZINE (APRESOLINE) 25 MG tablet Take 1 tablet (25 mg total) by mouth 3 (three) times daily. 05/08/18  Yes Mayo, Pete Pelt, MD  sitaGLIPtin (JANUVIA) 100 MG tablet Take 100 mg by mouth daily.   Yes [provider]  sodium zirconium cyclosilicate (LOKELMA) 10 g PACK packet Take 10 g  by mouth daily. 02/15/18  Yes Fritzi Mandes, MD  torsemide (DEMADEX) 10 MG tablet Take 10 mg by mouth daily.   Yes [provider]  fluticasone (FLONASE) 50 MCG/ACT nasal spray Place 2 sprays into both nostrils daily as needed for allergies or rhinitis.     [provider]      VITAL SIGNS:  Blood pressure (!) 178/81, pulse 67, temperature 98.5 F (36.9 C), temperature source Oral, resp. rate 20, height 6' (1.829 m), weight 118.1 kg, SpO2 97 %.  PHYSICAL EXAMINATION:  Physical Exam  GENERAL:  73 y.o.-year-old patient lying in the bed with no acute distress.  EYES: Pupils equal, round, reactive to light and accommodation. No scleral icterus.  Extraocular muscles intact.  HEENT: Head atraumatic, normocephalic. Oropharynx and nasopharynx clear.  NECK:  Supple, no jugular venous distention. No thyroid enlargement, no tenderness.  LUNGS: Normal breath sounds bilaterally, no wheezing, rales,rhonchi or crepitation. No use of accessory muscles of respiration.  CARDIOVASCULAR: S1, S2 normal. No murmurs, rubs, or gallops.  ABDOMEN: Soft, nontender, nondistended. Bowel sounds present. No organomegaly or mass.  EXTREMITIES: No pedal edema, cyanosis, or clubbing.  NEUROLOGIC: Cranial nerves II through XII are intact. Muscle strength 5/5 in all extremities. Sensation intact. Gait not checked. Word finding difficulties PSYCHIATRIC: The patient is alert and oriented x 3.  SKIN: No obvious rash, lesion, or ulcer.   LABORATORY PANEL:   CBC Recent Labs  Lab 05/09/18 0202  WBC 8.0  HGB 11.1*  HCT 34.2*  PLT 191   ------------------------------------------------------------------------------------------------------------------  Chemistries  Recent Labs  Lab 05/09/18 0202  NA 137  K 5.6*  CL 111  CO2 22  GLUCOSE 92  BUN 33*  CREATININE 4.57*  CALCIUM 8.4*  AST 17  ALT 11  ALKPHOS 95  BILITOT 0.9   ------------------------------------------------------------------------------------------------------------------  Cardiac Enzymes Recent Labs  Lab 05/09/18 0202  TROPONINI <0.03   ------------------------------------------------------------------------------------------------------------------  RADIOLOGY:  Ct Head Wo Contrast  Result Date: 05/07/2018 CLINICAL DATA:  Patient on Plavix who suffered a fall on stairs yesterday. Difficulty speaking today. EXAM: CT HEAD WITHOUT CONTRAST TECHNIQUE: Contiguous axial images were obtained from the base of the skull through the vertex without intravenous contrast. COMPARISON:  Head CT scan 07/09/2014. FINDINGS: Brain: No evidence of acute infarction, hemorrhage, hydrocephalus, extra-axial  collection or mass lesion/mass effect. Remote right MCA infarct noted. Vascular: No hyperdense vessel or unexpected calcification. Skull: Intact.  No focal lesion. Sinuses/Orbits: The left frontal sinus is almost completely opacified. 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 Superior aspect of the left maxillary sinus demonstrates marked mucosal thickening. Mild mucosal thickening right sphenoid sinus noted. Other: None. IMPRESSION: No acute intracranial abnormality. Remote right MCA infarct. Sinus disease. Electronically Signed   By: Inge Rise M.D.   On: 05/07/2018 09:03   Mr Brain Wo Contrast  Result Date: 05/07/2018 CLINICAL DATA:  73 year old male with abnormal speech upon waking. EXAM: MRI HEAD WITHOUT CONTRAST MRA HEAD WITHOUT CONTRAST TECHNIQUE: Multiplanar, multiecho pulse sequences of the brain and surrounding structures were obtained without intravenous contrast. Angiographic images of the head were obtained using MRA technique without contrast. COMPARISON:  Head CT without contrast earlier today. Carotid Doppler earlier today. FINDINGS: MRI HEAD FINDINGS Brain: No restricted diffusion to suggest acute infarction. No midline shift, mass effect, evidence of mass lesion, ventriculomegaly, extra-axial collection or acute intracranial hemorrhage. Cervicomedullary junction and pituitary are within normal limits. Chronic posterior right MCA territory encephalomalacia and gliosis (series 15, image 19). Minimal to mild scattered other nonspecific  bilateral cerebral white matter T2 and FLAIR hyperintensity. No chronic cerebral blood products or other cortical encephalomalacia. Negative deep gray matter nuclei, brainstem and cerebellum. Vascular: Major intracranial vascular flow voids are preserved. Skull and upper cervical spine: Negative visible cervical spine. Visualized bone marrow signal is within normal limits. Sinuses/Orbits: Advanced left side paranasal sinus disease including a fluid level in the  hyperplastic left frontal sinus. Left maxillary and ethmoid disease with inspissated material. Mild mucosal thickening and/or fluid in the sphenoid sinuses. The other right side sinuses are clear. Negative orbits. Other: Mastoids are clear. Visible internal auditory structures appear normal. Scalp and face soft tissues appear negative. MRA HEAD FINDINGS The distal vertebral arteries were not included but there is a widely patent basilar artery without stenosis. Normal SCA and PCA origins. Posterior communicating arteries are diminutive or absent. Normal bilateral PCA branches. Antegrade flow in both ICA siphons. Mild siphon irregularity. No siphon stenosis. Normal ophthalmic artery origins. Patent carotid termini. Normal MCA and ACA origins. Diminutive or absent anterior communicating artery. Visible ACA branches are within normal limits. MCA M1 segments and bi/trifurcations are patent without stenosis. Visible left MCA branches are within normal limits. Visible right MCA branches also appear within normal limits. IMPRESSION: 1. No acute intracranial abnormality. Chronic posterior right MCA infarct. 2. Negative intracranial MRA; the distal vertebral arteries were not included. 3. 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 Electronically Signed   By: Genevie Ann M.D.   On: 05/07/2018 17:47   US Carotid Bilateral (at [facility] Only)  Result Date: 05/07/2018 CLINICAL DATA:  Stroke EXAM: BILATERAL CAROTID DUPLEX ULTRASOUND TECHNIQUE: Pearline Cables scale imaging, color Doppler and duplex ultrasound were performed of bilateral carotid and vertebral arteries in the neck. COMPARISON:  None. FINDINGS: Criteria: Quantification of carotid stenosis is based on velocity parameters that correlate the residual internal carotid diameter with NASCET-based stenosis levels, using the diameter of the distal internal carotid lumen as the denominator for stenosis measurement. The following velocity measurements were obtained:  RIGHT ICA: 72 cm/sec CCA: 68 cm/sec SYSTOLIC ICA/CCA RATIO:  1.1 ECA: 103 cm/sec LEFT ICA: 63 cm/sec CCA: 70 cm/sec SYSTOLIC ICA/CCA RATIO:  0.9 ECA: 74 cm/sec RIGHT CAROTID ARTERY: Little if any plaque in the bulb. Low resistance internal carotid Doppler pattern. RIGHT VERTEBRAL ARTERY:  Antegrade. LEFT CAROTID ARTERY: Mild smooth calcified plaque in the bulb and lower internal carotid artery. Low resistance internal carotid Doppler pattern is preserved. LEFT VERTEBRAL ARTERY:  Antegrade. 1.3 cm hypoechoic nodule in the right lobe of the thyroid gland is noted. IMPRESSION: Less than 50% stenosis in the right and left internal carotid arteries. 1.3 cm right thyroid nodule. Dedicated complete thyroid ultrasound is recommended. Electronically Signed   By: Marybelle Killings M.D.   On: 05/07/2018 13:19   Mr Jodene Nam Head/brain ZL Cm  Result Date: 05/07/2018 CLINICAL DATA:  73 year old male with abnormal speech upon waking. EXAM: MRI HEAD WITHOUT CONTRAST MRA HEAD WITHOUT CONTRAST TECHNIQUE: Multiplanar, multiecho pulse sequences of the brain and surrounding structures were obtained without intravenous contrast. Angiographic images of the head were obtained using MRA technique without contrast. COMPARISON:  Head CT without contrast earlier today. Carotid Doppler earlier today. FINDINGS: MRI HEAD FINDINGS Brain: No restricted diffusion to suggest acute infarction. No midline shift, mass effect, evidence of mass lesion, ventriculomegaly, extra-axial collection or acute intracranial hemorrhage. Cervicomedullary junction and pituitary are within normal limits. Chronic posterior right MCA territory encephalomalacia and gliosis (series 15, image 19). Minimal to mild scattered other nonspecific bilateral  cerebral white matter T2 and FLAIR hyperintensity. No chronic cerebral blood products or other cortical encephalomalacia. Negative deep gray matter nuclei, brainstem and cerebellum. Vascular: Major intracranial vascular flow voids are  preserved. Skull and upper cervical spine: Negative visible cervical spine. Visualized bone marrow signal is within normal limits. Sinuses/Orbits: Advanced left side paranasal sinus disease including a fluid level in the hyperplastic left frontal sinus. Left maxillary and ethmoid disease with inspissated material. Mild mucosal thickening and/or fluid in the sphenoid sinuses. The other right side sinuses are clear. Negative orbits. Other: Mastoids are clear. Visible internal auditory structures appear normal. Scalp and face soft tissues appear negative. MRA HEAD FINDINGS The distal vertebral arteries were not included but there is a widely patent basilar artery without stenosis. Normal SCA and PCA origins. Posterior communicating arteries are diminutive or absent. Normal bilateral PCA branches. Antegrade flow in both ICA siphons. Mild siphon irregularity. No siphon stenosis. Normal ophthalmic artery origins. Patent carotid termini. Normal MCA and ACA origins. Diminutive or absent anterior communicating artery. Visible ACA branches are within normal limits. MCA M1 segments and bi/trifurcations are patent without stenosis. Visible left MCA branches are within normal limits. Visible right MCA branches also appear within normal limits. IMPRESSION: 1. No acute intracranial abnormality. Chronic posterior right MCA infarct. 2. Negative intracranial MRA; the distal vertebral arteries were not included. 3. 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 Electronically Signed   By: Genevie Ann M.D.   On: 05/07/2018 17:47   Ct Head Code Stroke Wo Contrast  Result Date: 05/09/2018 CLINICAL DATA:  Code stroke. Acute onset speech difficulties, altered mental status. History of stroke, hypertension and diabetes, on Coumadin. EXAM: CT HEAD WITHOUT CONTRAST TECHNIQUE: Contiguous axial images were obtained from the base of the skull through the vertex without intravenous contrast. COMPARISON:  MRI of the head May 07, 2018. FINDINGS: BRAIN: No intraparenchymal hemorrhage, mass effect nor midline shift. No parenchymal brain volume loss for age. No hydrocephalus. RIGHT parietal encephalomalacia, mild ex vacuo dilatation RIGHT lateral ventricle. No acute large vascular territory infarcts. No abnormal extra-axial fluid collections. Basal cisterns are patent. VASCULAR: Unremarkable. SKULL: No skull fracture. No significant scalp soft tissue swelling. SINUSES/ORBITS: Predominantly LEFT-sided sinusitis, chronic changes LEFT maxillary sinus. The included ocular globes and orbital contents are non-suspicious. OTHER: None. ASPECTS [facility] Stroke Program Early CT Score) - Ganglionic level infarction (caudate, lentiform nuclei, internal capsule, insula, M1-M3 cortex): 7 - Supraganglionic infarction (M4-M6 cortex): 3 Total score (0-10 with 10 being normal): 10 IMPRESSION: 1. No acute intracranial process. 2. ASPECTS is 10. 3. Old RIGHT parietal/MCA territory infarct. Otherwise negative non-contrast CT HEAD for age. 4. Critical Value/emergent results were called by telephone at the time of interpretation on 05/09/2018 at 2:15 am to Dr. Lurline Hare , who verbally acknowledged these results. Electronically Signed   By: Elon Alas M.D.   On: 05/09/2018 02:16      IMPRESSION AND PLAN:  1. Word finding difficulties- likely secondary to TIA,  ??? / Possible hypoglycemia events - Recent stroke work up negative. - Tele- neurology reccommended in-house neurology consult and high risk TIA work up - hold home oral hypoglycemic drugs. - Monitor CBG and SSI. - Monitor BP. PRN IV hydralazine for elevated BP. - Cont ASA 81 mg, Plavix 75 mg and Lipitor 40 mg   2. Uncontrolled hypertension -continue home medications.  Adjust medication as indicated.  PRN IV hydralazine.  3. 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 Chronic other medical problems :  Monitor  DVT PPX : Heparin sq  Estimated length of stay less than 2 midnights  Further treatment based on clinical course and specialist evaluation  All the records are reviewed and case discussed with ED provider. Management plans discussed with the patient, family and they are in agreement.  CODE STATUS: Full  TOTAL TIME TAKING CARE OF THIS PATIENT: 30 minutes.    Sedalia Muta M.D on 05/09/2018 at 3:22 AM  Between 7am to 6pm - Pager - [phone]  After 6pm go to [email or web address] - [facility]  Office  [phone]  CC: Primary care physician; Kirk Ruths, MD

## 2018-05-09 NOTE — Consult Note (Signed)
[facility] TeleNeurology Consult Services   Date of Service:   05/09/2018 02:12:45  Impression:     .  RO Acute Ischemic Stroke  Comments: transient word finding difficulty upon awakening today at 130 am; h/o stroke in the past and recent admission for similar symtpoms; nihss 0 now symptoms resolved and NOT tPA candidate; CT head with chronic R MCA/parietal infarct; no acute findings  Metrics: Last Known Well: 05/09/2018 01:30:00 [facility] Notification Time: 05/09/2018 02:12:45 Arrival Time: 05/09/2018 01:46:00 Stamp Time: 05/09/2018 02:12:45 Time First Login Attempt: 05/09/2018 02:16:07 Video Start Time: 05/09/2018 02:16:07  Symptoms: word finding difficulty NIHSS Start Assessment Time: 05/09/2018 02:16:11 Patient is not a candidate for tPA. Patient was not deemed candidate for tPA thrombolytics because of Resolved symptoms. Video End Time: 05/09/2018 02:26:14  ED Physician notified of diagnostic impression and management plan on 05/09/2018 02:26:23  Our recommendations are outlined below.  Recommendations:     .  Activate Stroke Protocol Admission/Order Set     .  Stroke/Telemetry Floor     .  Neuro Checks     .  Bedside Swallow Eval     .  DVT Prophylaxis     .  IV Fluids, Normal Saline     .  Head of Bed Below 30 Degrees     .  Euglycemia and Avoid Hyperthermia (PRN Acetaminophen)     .  Initiate Plavix     .  Give extra plavix 75 now IV fluids with NS and permissive HTN, SBP <220; admit for TIA vs stroke work up  Routine Consultation with [facility] for Follow up Care  Sign Out:     .  Discussed with Emergency Department Provider    ------------------------------------------------------------------------------  History of Present Illness: Patient is a 73 year old Male.  Patient was brought by EMS for symptoms of word finding difficulty  recently discharged from hospital yesterday after TIA; tonight woke up at 130 am with  word finding difficulty and mixing up words; h/o stroke 4 years ago, on plavix at home    Examination: 1A: Level of Consciousness - Alert; keenly responsive + 0 1B: Ask Month and Age - Both Questions Right + 0 1C: Blink Eyes & Squeeze Hands - Performs Both Tasks + 0 2: Test Horizontal Extraocular Movements - Normal + 0 3: Test Visual Fields - No Visual Loss + 0 4: Test Facial Palsy (Use Grimace if Obtunded) - Normal symmetry + 0 5A: Test Left Arm Motor Drift - No Drift for 10 Seconds + 0 5B: Test Right Arm Motor Drift - No Drift for 10 Seconds + 0 6A: Test Left Leg Motor Drift - No Drift for 5 Seconds + 0 6B: Test Right Leg Motor Drift - No Drift for 5 Seconds + 0 7: Test Limb Ataxia (FNF/Heel-Shin) - No Ataxia + 0 8: Test Sensation - Normal; No sensory loss + 0 9: Test Language/Aphasia - Normal; No aphasia + 0 10: Test Dysarthria - Normal + 0 11: Test Extinction/Inattention - No abnormality + 0  NIHSS Score: 0  Patient was informed the Neurology Consult would happen via TeleHealth consult by way of interactive audio and video telecommunications and consented to receiving care in this manner.  Due to the immediate potential for life-threatening deterioration due to underlying acute neurologic illness, I spent 35 minutes providing critical care. This time includes time for face to face visit via telemedicine, review of medical records, imaging studies and discussion of findings with providers, the patient  and/or family.   Dr Agustin Cree   [facility] [phone]  Case [id number]

## 2018-05-09 NOTE — ED Provider Notes (Addendum)
Patient has been evaluated by inpatient neurology, Dr. Paschal Dopp.  Plan is to obtain spot EEG here in the ER and have patient discharged home for close outpatient follow-up.  He is been started on AED due to concern for possible seizure.  No indication for hospitalization at this time.  ----------------------------------------- 3:02 PM on 05/09/2018 -----------------------------------------  Patient back from EEG.  Remains symptoms asymptomatic at this time.  I do believe he stable and appropriate for outpatient follow-up as described and discussed with neurology.  Have discussed with the patient and available family all diagnostics and treatments performed thus far and all questions were answered to the best of my ability. The patient demonstrates understanding and agreement with plan.    Merlyn Lot, MD 05/09/18 1215    Merlyn Lot, MD 05/09/18 [phone]

## 2018-05-09 NOTE — ED Notes (Signed)
Report to [place], rn.

## 2018-05-09 NOTE — Progress Notes (Signed)
CODE STROKE- PHARMACY COMMUNICATION   Time CODE STROKE called/page received:@ 0200  Time response to CODE STROKE was made (in person or via phone): @ 0210  Time Stroke Kit retrieved from [place] (only if needed):N/A   Past Medical History:  Diagnosis Date  . Diabetes mellitus without complication ([place])   . Gastritis   . Hypertension   . Renal disorder   . Stroke [facility])    Prior to Admission medications   Medication Sig Start Date End Date Taking? Authorizing Provider  amLODipine (NORVASC) 10 MG tablet Take 1 tablet (10 mg total) by mouth daily. 05/08/18   Mayo, Pete Pelt, MD  aspirin EC 81 MG EC tablet Take 1 tablet (81 mg total) by mouth daily. 05/09/18   Mayo, Pete Pelt, MD  atorvastatin (LIPITOR) 40 MG tablet Take 1 tablet (40 mg total) by mouth daily at 6 PM. 05/08/18   Mayo, Pete Pelt, MD  carvedilol (COREG) 25 MG tablet Take 25 mg by mouth 2 (two) times daily with a meal.     [provider]  clopidogrel (PLAVIX) 75 MG tablet Take 75 mg by mouth every evening.     [provider]  fluticasone (FLONASE) 50 MCG/ACT nasal spray Place 2 sprays into both nostrils daily as needed for allergies or rhinitis.     [provider]  fluticasone (FLONASE) 50 MCG/ACT nasal spray Place 2 sprays into both nostrils daily. 05/08/18 08/06/18  Mayo, Pete Pelt, MD  glimepiride (AMARYL) 2 MG tablet Take 2 mg by mouth daily with breakfast.    [provider]  hydrALAZINE (APRESOLINE) 25 MG tablet Take 1 tablet (25 mg total) by mouth 3 (three) times daily. 05/08/18   Mayo, Pete Pelt, MD  sitaGLIPtin (JANUVIA) 100 MG tablet Take 100 mg by mouth daily.    [provider]  sodium zirconium cyclosilicate (LOKELMA) 10 g PACK packet Take 10 g by mouth daily. 02/15/18   Fritzi Mandes, MD  torsemide (DEMADEX) 10 MG tablet Take 10 mg by mouth daily.    [provider]    Pernell Dupre, PharmD, BCPS Clinical Pharmacist 05/09/2018 2:41 AM

## 2018-05-09 NOTE — ED Notes (Signed)
Dr. Sandi Mealy with [facility] on screen evaluating pt.

## 2018-05-09 NOTE — ED Notes (Signed)
Pt wheeled out to car.

## 2018-05-09 NOTE — Progress Notes (Addendum)
PT Cancellation Note  Patient Details Name: Tyler Wiley MRN: [id number] DOB: [birth year]   Cancelled Treatment:    Reason Eval/Treat Not Completed: Medical issues which prohibited therapy(Consult received and chart reviewed.  Patient noted with critically elevated potassium.  Contraindicated for exertional activity at this time.  Will re-attempt at later time/date as medically appropriate and available.)   Ramina Hulet H. Owens Shark, PT, DPT, NCS 05/09/18, 1:11 PM [phone]

## 2018-05-09 NOTE — Discharge Instructions (Addendum)
Please continue taking your [facility] and follow-up with neurology.  You have been prescribed Vimpat to be taken twice daily.

## 2018-05-09 NOTE — Progress Notes (Signed)
OT Cancellation Note  Patient Details Name: Tyler Wiley MRN: [id number] DOB: 07-12-1945   Cancelled Treatment:    Reason Eval/Treat Not Completed: Medical issues which prohibited therapy. Consult received, chart reviewed. Pt noted with K+ 5.6. Per therapy guidelines, pt not appropriate for therapy at this time. Will continue to follow acutely and re-attempt OT evaluation at later date/time as pt is medically appropriate.   Jeni Salles, MPH, MS, OTR/L ascom [phone] 05/09/18, 1:13 PM

## 2018-05-09 NOTE — ED Notes (Signed)
Pt/family given tv remote. Deny any other needs.

## 2018-06-02 DIAGNOSIS — Z8673 Personal history of transient ischemic attack (TIA), and cerebral infarction without residual deficits: Secondary | ICD-10-CM

## 2018-06-03 ENCOUNTER — Other Ambulatory Visit: Payer: Self-pay | Admitting: Internal Medicine

## 2018-06-05 ENCOUNTER — Other Ambulatory Visit: Payer: Self-pay | Admitting: Internal Medicine

## 2018-10-04 DIAGNOSIS — N186 End stage renal disease: Secondary | ICD-10-CM

## 2018-10-18 ENCOUNTER — Other Ambulatory Visit: Payer: Self-pay

## 2018-10-18 ENCOUNTER — Emergency Department
Admission: EM | Admit: 2018-10-18 | Discharge: 2018-10-19 | Disposition: A | Discharge status: Home / Self Care | Payer: Medicare Other | Attending: Emergency Medicine | Admitting: Emergency Medicine

## 2018-10-18 DIAGNOSIS — R339 Retention of urine, unspecified: Secondary | ICD-10-CM

## 2018-10-18 DIAGNOSIS — Z7982 Long term (current) use of aspirin: Secondary | ICD-10-CM | POA: Diagnosis not present

## 2018-10-18 DIAGNOSIS — Y69 Unspecified misadventure during surgical and medical care: Secondary | ICD-10-CM | POA: Insufficient documentation

## 2018-10-18 DIAGNOSIS — T8859XA Other complications of anesthesia, initial encounter: Secondary | ICD-10-CM

## 2018-10-18 DIAGNOSIS — I10 Essential (primary) hypertension: Secondary | ICD-10-CM | POA: Diagnosis not present

## 2018-10-18 DIAGNOSIS — T41205A Adverse effect of unspecified general anesthetics, initial encounter: Secondary | ICD-10-CM | POA: Insufficient documentation

## 2018-10-18 DIAGNOSIS — Z79899 Other long term (current) drug therapy: Secondary | ICD-10-CM | POA: Diagnosis not present

## 2018-10-18 DIAGNOSIS — Z8673 Personal history of transient ischemic attack (TIA), and cerebral infarction without residual deficits: Secondary | ICD-10-CM | POA: Diagnosis not present

## 2018-10-18 DIAGNOSIS — E119 Type 2 diabetes mellitus without complications: Secondary | ICD-10-CM | POA: Insufficient documentation

## 2018-10-18 DIAGNOSIS — T887XXA Unspecified adverse effect of drug or medicament, initial encounter: Secondary | ICD-10-CM | POA: Insufficient documentation

## 2018-10-18 DIAGNOSIS — Z87891 Personal history of nicotine dependence: Secondary | ICD-10-CM | POA: Insufficient documentation

## 2018-10-18 DIAGNOSIS — T4145XA Adverse effect of unspecified anesthetic, initial encounter: Secondary | ICD-10-CM

## 2018-10-18 MED ORDER — TAMSULOSIN HCL 0.4 MG PO CAPS: 0.4000 mg | ORAL_CAPSULE | Freq: Every day | ORAL | 0 refills | Status: DC

## 2018-10-18 NOTE — ED Triage Notes (Addendum)
Pt arrives to ED via POV from home with c/o urinary retention x8 hrs. Pt reports having a peritoneal dialysis catheter removed surgically this morning at [facility]. Pt states following surgery, they had to I&O cath to relieve bladder pressure d/t inability to void. Pt reports the last time his bladder was emptied was around 3pm today. Pt denies fever, no N/V/D. Pt is A&O, in NAD; RR even, regular, and unlabored.

## 2018-10-18 NOTE — ED Provider Notes (Signed)
[facility] Emergency Department Provider Note  ____________________________________________  Time seen: Approximately 11:18 PM  I have reviewed the triage vital signs and the nursing notes.   HISTORY  Chief Complaint Urinary Retention    HPI Tyler Wiley is a 73 y.o. male who presents the emergency department complaining of urinary retention.  Patient reports that he has had complications with anesthesia and urinary retention.  Patient reports that today he had a procedure to place a dialysis shunt.  Patient reports that he handled surgery fine, however he has had urinary retention after surgery.  Patient reports that he told the anesthesiology team of this complication, they did an in and out catheter right after surgery but did not wait to see if the patient was able to urinate by himself.  Patient reports that he has not been able to urinate since this afternoon.  He has increased bladder pressure.  Patient reports that he has followed with urology, and had a reassuring urologic work-up.  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  No other complaints at this time.         Past Medical History:  Diagnosis Date  . Diabetes mellitus without complication ([place])   . Gastritis   . Hypertension   . Renal disorder   . Stroke [facility])     Patient Active Problem List   Diagnosis Date Noted  . TIA (transient ischemic attack) 05/09/2018  . 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 Hyperkalemia 02/14/2018    Past Surgical History:  Procedure Laterality Date  . CHOLECYSTECTOMY      Prior to Admission medications   Medication Sig Start Date End Date Taking? Authorizing Provider  amLODipine (NORVASC) 10 MG tablet Take 1 tablet (10 mg total) by mouth daily. 05/08/18   Mayo, Pete Pelt, MD  aspirin EC 81 MG EC tablet Take 1 tablet (81 mg total) by mouth daily. 05/09/18   Mayo, Pete Pelt, MD  atorvastatin (LIPITOR) 40 MG tablet Take 1 tablet (40 mg  total) by mouth daily at 6 PM. 05/08/18   Mayo, Pete Pelt, MD  carvedilol (COREG) 25 MG tablet Take 25 mg by mouth 2 (two) times daily with a meal.     [provider]  clopidogrel (PLAVIX) 75 MG tablet Take 75 mg by mouth every evening.     [provider]  fluticasone (FLONASE) 50 MCG/ACT nasal spray Place 2 sprays into both nostrils daily as needed for allergies or rhinitis.     [provider]  fluticasone (FLONASE) 50 MCG/ACT nasal spray Place 2 sprays into both nostrils daily. Patient taking differently: Place 2 sprays into both nostrils daily as needed.  05/08/18 08/06/18  Mayo, Pete Pelt, MD  glimepiride (AMARYL) 2 MG tablet Take 2 mg by mouth daily with breakfast.    [provider]  hydrALAZINE (APRESOLINE) 25 MG tablet Take 1 tablet (25 mg total) by mouth 3 (three) times daily. 05/08/18   Mayo, Pete Pelt, MD  Lacosamide (VIMPAT) 100 MG TABS Take 1 tablet (100 mg total) by mouth 2 (two) times daily. 05/09/18   Merlyn Lot, MD  sitaGLIPtin (JANUVIA) 100 MG tablet Take 100 mg by mouth daily.    [provider]  sodium zirconium cyclosilicate (LOKELMA) 10 g PACK packet Take 10 g by mouth daily. 02/15/18   Fritzi Mandes, MD  tamsulosin (FLOMAX) 0.4 MG CAPS capsule Take 1 capsule (0.4 mg total) by mouth daily. 10/18/18   Lamanda Rudder, Charline Bills, PA-C  torsemide (DEMADEX) 10 MG tablet Take 10 mg by mouth daily.    [provider]    Allergies Patient has no known allergies.  Family History  Problem Relation Age of Onset  . CAD Father     Social History Social History   Tobacco Use  . Smoking status: Former Research scientist (life sciences)  . Smokeless tobacco: Never Used  Substance Use Topics  . Alcohol use: Yes    Comment: Social   . Drug use: Never     Review of Systems  Constitutional: No fever/chills Eyes: No visual changes. No discharge ENT: No upper respiratory complaints. Cardiovascular: no chest pain. Respiratory: no cough. No  SOB. Gastrointestinal: No abdominal pain.  No nausea, no vomiting.  No diarrhea.  No constipation. Genitourinary: Positive for urinary retention Musculoskeletal: Negative for musculoskeletal pain. Skin: Negative for rash, abrasions, lacerations, ecchymosis. Neurological: Negative for headaches, focal weakness or numbness. 10-point ROS otherwise negative.  ____________________________________________   PHYSICAL EXAM:  VITAL SIGNS: ED Triage Vitals  Enc Vitals Group     BP 10/18/18 2309 (!) 141/89     Pulse Rate 10/18/18 2309 86     Resp 10/18/18 2309 18     Temp 10/18/18 2309 98.7 F (37.1 C)     Temp Source 10/18/18 2309 Oral     SpO2 10/18/18 2309 93 %     Weight 10/18/18 2306 258 lb (117 kg)     Height 10/18/18 2306 6' (1.829 m)     Head Circumference --      Peak Flow --      Pain Score 10/18/18 2306 6     Pain Loc --      Pain Edu? --      Excl. in [place]? --      Constitutional: Alert and oriented. Well appearing and in no acute distress. Eyes: Conjunctivae are normal. PERRL. EOMI. Head: Atraumatic. Neck: No stridor.    Cardiovascular: Normal rate, regular rhythm. Normal S1 and S2.  Good peripheral circulation. Respiratory: Normal respiratory effort without tachypnea or retractions. Lungs CTAB. Good air entry to the bases with no decreased or absent breath sounds. Gastrointestinal: Bowel sounds 4 quadrants. Soft to palpation all quadrants.  No guarding or rigidity. No palpable masses.  Mild distention over the suprapubic region.  Tenderness to palpation of the bladder.  No CVA tenderness. Musculoskeletal: Full range of motion to all extremities. No gross deformities appreciated. Neurologic:  Normal speech and language. No gross focal neurologic deficits are appreciated.  Skin:  Skin is warm, dry and intact. No rash noted. Psychiatric: Mood and affect are normal. Speech and behavior are normal. Patient exhibits appropriate insight and  judgement.   ____________________________________________   LABS (all labs ordered are listed, but only abnormal results are displayed)  Labs Reviewed - No data to display ____________________________________________  EKG   ____________________________________________  RADIOLOGY   No results found.  ____________________________________________    PROCEDURES  Procedure(s) performed:    Procedures    Medications - No data to display   ____________________________________________   INITIAL IMPRESSION / ASSESSMENT AND PLAN / ED COURSE  Pertinent labs & imaging results that were available during my care of the patient were reviewed by me and considered in my medical decision making (see chart for details).  Review of the [place] CSRS was performed in accordance of the [place] prior to dispensing any controlled drugs.           Patient's diagnosis is consistent with urinary retention, secondary to complication  of anesthesia.  Patient presented to the emergency department complaining of urinary retention.  Patient has had difficulty urinating after anesthesia in the past.  Patient has been evaluated by urology with reassuring neurologic work-up.  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  Patient had been in and out caths immediately after surgery but they did not wait to see whether patient can urinate on his own.  At this time patient was catheterized and will be discharged home with same.  Patient is to follow-up with his urologist or primary care.  Patient will be started on Flomax.  No labs or imaging at this time..  Patient is given ED precautions to return to the ED for any worsening or new symptoms.     ____________________________________________  FINAL CLINICAL IMPRESSION(S) / ED DIAGNOSES  Final diagnoses:  Urinary retention  Complication of anesthesia, initial encounter      NEW MEDICATIONS  STARTED DURING THIS VISIT:  ED Discharge Orders         Ordered    tamsulosin (FLOMAX) 0.4 MG CAPS capsule  Daily     10/18/18 2324              This chart was dictated using voice recognition software/Dragon. Despite best efforts to proofread, errors can occur which can change the meaning. Any change was purely unintentional.   Darletta Moll, PA-C 10/19/18 2054    Nance Pear, MD 10/20/18 [phone]

## 2018-10-24 ENCOUNTER — Telehealth: Payer: Self-pay | Admitting: Urology

## 2018-10-24 NOTE — Telephone Encounter (Signed)
Tyler Wiley was seen in the ED on 10/18/2018 and it looks like he had a Foley placed for urinary retention and given Flomax.  He was to follow up with urology, but I don't see an appointment for him.  Would you call him and make an appointment for him to be seen?  He has not seen [place] since we have been on Epic.  I don't know if we have seen him in the past otherwise.

## 2018-10-25 NOTE — Telephone Encounter (Signed)
Patient was last seen by [facility] in 2014.

## 2018-10-27 NOTE — Telephone Encounter (Signed)
Looks like he is being seen by his doctor's at [facility] it is in My chart   Sharyn Lull

## 2018-11-21 ENCOUNTER — Other Ambulatory Visit: Payer: Self-pay | Admitting: Nephrology

## 2018-11-21 DIAGNOSIS — Z79899 Other long term (current) drug therapy: Secondary | ICD-10-CM | POA: Insufficient documentation

## 2018-11-21 DIAGNOSIS — I120 Hypertensive chronic kidney disease with stage 5 chronic kidney disease or end stage renal disease: Secondary | ICD-10-CM | POA: Diagnosis not present

## 2018-11-21 DIAGNOSIS — Z87891 Personal history of nicotine dependence: Secondary | ICD-10-CM | POA: Insufficient documentation

## 2018-11-21 DIAGNOSIS — R140 Abdominal distension (gaseous): Secondary | ICD-10-CM | POA: Diagnosis not present

## 2018-11-21 DIAGNOSIS — E1122 Type 2 diabetes mellitus with diabetic chronic kidney disease: Secondary | ICD-10-CM | POA: Insufficient documentation

## 2018-11-21 DIAGNOSIS — Z8673 Personal history of transient ischemic attack (TIA), and cerebral infarction without residual deficits: Secondary | ICD-10-CM | POA: Diagnosis not present

## 2018-11-21 DIAGNOSIS — N186 End stage renal disease: Secondary | ICD-10-CM | POA: Insufficient documentation

## 2018-11-21 DIAGNOSIS — R079 Chest pain, unspecified: Secondary | ICD-10-CM | POA: Diagnosis not present

## 2018-11-21 DIAGNOSIS — Z7984 Long term (current) use of oral hypoglycemic drugs: Secondary | ICD-10-CM | POA: Insufficient documentation

## 2018-11-21 DIAGNOSIS — Z992 Dependence on renal dialysis: Secondary | ICD-10-CM | POA: Diagnosis not present

## 2018-11-21 DIAGNOSIS — R0602 Shortness of breath: Secondary | ICD-10-CM | POA: Diagnosis present

## 2018-11-21 NOTE — ED Triage Notes (Signed)
Patient to ER for c/o shortness of breath x2 days, worse tonight. Patient developed pain bilaterally under both breasts. Patient does peritoneal dialysis. Patient's wife states yesterday, patient's abdomen was very distended. Has been on dialysis 3 weeks.

## 2018-11-22 ENCOUNTER — Other Ambulatory Visit: Payer: Self-pay

## 2018-11-22 ENCOUNTER — Emergency Department: Payer: Medicare Other

## 2018-11-22 ENCOUNTER — Emergency Department
Admission: EM | Admit: 2018-11-22 | Discharge: 2018-11-22 | Disposition: A | Discharge status: Home / Self Care | Payer: Medicare Other | Attending: Emergency Medicine | Admitting: Emergency Medicine

## 2018-11-22 ENCOUNTER — Encounter: Payer: Self-pay | Admitting: Emergency Medicine

## 2018-11-22 DIAGNOSIS — R140 Abdominal distension (gaseous): Secondary | ICD-10-CM

## 2018-11-22 DIAGNOSIS — R0602 Shortness of breath: Secondary | ICD-10-CM

## 2018-11-22 LAB — COMPREHENSIVE METABOLIC PANEL
ALT: 14 U/L (ref 0–44)
AST: 15 U/L (ref 15–41)
Albumin: 3.3 g/dL — ABNORMAL LOW (ref 3.5–5.0)
Alkaline Phosphatase: 121 U/L (ref 38–126)
Anion gap: 10 (ref 5–15)
BUN: 39 mg/dL — ABNORMAL HIGH (ref 8–23)
CO2: 25 mmol/L (ref 22–32)
Calcium: 7.6 mg/dL — ABNORMAL LOW (ref 8.9–10.3)
Chloride: 91 mmol/L — ABNORMAL LOW (ref 98–111)
Creatinine, Ser: 6.74 mg/dL — ABNORMAL HIGH (ref 0.61–1.24)
GFR calc Af Amer: 9 mL/min — ABNORMAL LOW (ref >60)
GFR calc non Af Amer: 7 mL/min — ABNORMAL LOW (ref >60)
Glucose, Bld: 174 mg/dL — ABNORMAL HIGH (ref 70–99)
Potassium: 3.7 mmol/L (ref 3.5–5.1)
Sodium: 126 mmol/L — ABNORMAL LOW (ref 135–145)
Total Bilirubin: 1 mg/dL (ref 0.3–1.2)
Total Protein: 6.7 g/dL (ref 6.5–8.1)

## 2018-11-22 LAB — CBC WITH DIFFERENTIAL/PLATELET
Abs Immature Granulocytes: 0.04 10*3/uL (ref 0.00–0.07)
Basophils Absolute: 0 10*3/uL (ref 0.0–0.1)
Basophils Relative: 0 %
Eosinophils Absolute: 0.2 10*3/uL (ref 0.0–0.5)
Eosinophils Relative: 2 %
HCT: 31.9 % — ABNORMAL LOW (ref 39.0–52.0)
Hemoglobin: 10.6 g/dL — ABNORMAL LOW (ref 13.0–17.0)
Immature Granulocytes: 0 %
Lymphocytes Relative: 16 %
Lymphs Abs: 1.7 10*3/uL (ref 0.7–4.0)
MCH: 29.4 pg (ref 26.0–34.0)
MCHC: 33.2 g/dL (ref 30.0–36.0)
MCV: 88.6 fL (ref 80.0–100.0)
Monocytes Absolute: 1.3 10*3/uL — ABNORMAL HIGH (ref 0.1–1.0)
Monocytes Relative: 12 %
Neutro Abs: 7.2 10*3/uL (ref 1.7–7.7)
Neutrophils Relative %: 70 %
Platelets: 180 10*3/uL (ref 150–400)
RBC: 3.6 MIL/uL — ABNORMAL LOW (ref 4.22–5.81)
RDW: 13.6 % (ref 11.5–15.5)
WBC: 10.5 10*3/uL (ref 4.0–10.5)
nRBC: 0 % (ref 0.0–0.2)

## 2018-11-22 LAB — TROPONIN I (HIGH SENSITIVITY): Troponin I (High Sensitivity): 15 ng/L (ref <18)

## 2018-11-22 LAB — LIPASE, BLOOD: Lipase: 52 U/L — ABNORMAL HIGH (ref 11–51)

## 2018-11-22 MED ORDER — OXYCODONE HCL 5 MG PO TABS
5.0000 mg | ORAL_TABLET | Freq: Once | ORAL | Status: AC
  Administered 2018-11-22: 02:00:00 5 mg via ORAL
  Filled 2018-11-22: qty 1

## 2018-11-22 MED ORDER — ACETAMINOPHEN 500 MG PO TABS
1000.0000 mg | ORAL_TABLET | Freq: Once | ORAL | Status: AC
  Administered 2018-11-22: 1000 mg via ORAL
  Filled 2018-11-22: qty 2

## 2018-11-22 NOTE — ED Provider Notes (Addendum)
[facility] Emergency Department Provider Note  ____________________________________________  Time seen: Approximately 2:16 AM  I have reviewed the triage vital signs and the nursing notes.   HISTORY  Chief Complaint No chief complaint on file.   HPI Tyler Wiley is a 73 y.o. male with a history of end-stage renal disease on peritoneal dialysis, diabetes, gastritis, hypertension who presents for evaluation of shortness of breath and chest pain.  Patient reports that over the last 3 days he had to add a large amount of fluid in his abdomen, rich in glucose, to allow for better filtration of the blood. Since then his abdomen has become severely distended and he has been complaining of lower chest/upper abdominal pain. He also has had progressively worsening shortness of breath.  He continues to dialyze himself every evening.  This evening the shortness of breath and the pain became so severe that patient was unable to complete his treatment and also unable to sleep which prompted his visit to the emergency room.  No cough, no fever, no abdominal pain, no vomiting, no nausea, no diarrhea, no constipation.  He describes the pain as a muscle pull/soreness which is constant located bilaterally under his rib cage, nonpleuritic.  The shortness of breath is worse with laying flat or with ambulation.  Past Medical History:  Diagnosis Date  . Diabetes mellitus without complication ([place])   . Gastritis   . Hypertension   . Renal disorder   . Stroke [facility])     Patient Active Problem List   Diagnosis Date Noted  . TIA (transient ischemic attack) 05/09/2018  . 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 Hyperkalemia 02/14/2018    Past Surgical History:  Procedure Laterality Date  . CHOLECYSTECTOMY      Prior to Admission medications   Medication Sig Start Date End Date Taking? Authorizing Provider  albuterol (VENTOLIN HFA) 108 (90 Base) MCG/ACT inhaler Inhale 1-2 puffs  into the lungs every 4 (four) hours as needed for wheezing or shortness of breath.   Yes [provider]  amLODipine (NORVASC) 10 MG tablet Take 1 tablet (10 mg total) by mouth daily. 05/08/18  Yes Mayo, Pete Pelt, MD  atorvastatin (LIPITOR) 40 MG tablet Take 1 tablet (40 mg total) by mouth daily at 6 PM. 05/08/18  Yes Mayo, Pete Pelt, MD  carvedilol (COREG) 25 MG tablet Take 25 mg by mouth 2 (two) times daily with a meal.    Yes [provider]  clopidogrel (PLAVIX) 75 MG tablet Take 75 mg by mouth every evening.    Yes [provider]  fluticasone (FLONASE) 50 MCG/ACT nasal spray Place 2 sprays into both nostrils daily. Patient taking differently: Place 2 sprays into both nostrils daily as needed.  05/08/18 11/22/18 Yes Mayo, Pete Pelt, MD  glimepiride (AMARYL) 2 MG tablet Take 2 mg by mouth daily with breakfast.   Yes [provider]  hydrALAZINE (APRESOLINE) 25 MG tablet Take 1 tablet (25 mg total) by mouth 3 (three) times daily. 05/08/18  Yes Mayo, Pete Pelt, MD  sitaGLIPtin (JANUVIA) 100 MG tablet Take 100 mg by mouth daily.   Yes [provider]  torsemide (DEMADEX) 10 MG tablet Take 10 mg by mouth daily.   Yes [provider]  aspirin EC 81 MG EC tablet Take 1 tablet (81 mg total) by mouth daily. Patient not taking: Reported on 11/22/2018 05/09/18   MayoPete Pelt, MD  Lacosamide (VIMPAT) 100 MG TABS Take 1 tablet (100 mg  total) by mouth 2 (two) times daily. Patient not taking: Reported on 11/22/2018 05/09/18   Merlyn Lot, MD  sodium zirconium cyclosilicate (LOKELMA) 10 g PACK packet Take 10 g by mouth daily. Patient not taking: Reported on 11/22/2018 02/15/18   Fritzi Mandes, MD  tamsulosin (FLOMAX) 0.4 MG CAPS capsule Take 1 capsule (0.4 mg total) by mouth daily. Patient not taking: Reported on 11/22/2018 10/18/18   Cuthriell, Charline Bills, PA-C    Allergies Patient has no known allergies.  Family History  Problem Relation Age of Onset  .  CAD Father     Social History Social History   Tobacco Use  . Smoking status: Former Research scientist (life sciences)  . Smokeless tobacco: Never Used  Substance Use Topics  . Alcohol use: Yes    Comment: Social   . Drug use: Never    Review of Systems  Constitutional: Negative for fever. Eyes: Negative for visual changes. ENT: Negative for sore throat. Neck: No neck pain  Cardiovascular: + chest pain. Respiratory: + shortness of breath. Gastrointestinal: Negative for abdominal pain, vomiting or diarrhea. Genitourinary: Negative for dysuria. Musculoskeletal: Negative for back pain. Skin: Negative for rash. Neurological: Negative for headaches, weakness or numbness. Psych: No SI or HI  ____________________________________________   PHYSICAL EXAM:  VITAL SIGNS: ED Triage Vitals  Enc Vitals Group     BP 11/21/18 2356 (!) 110/56     Pulse Rate 11/21/18 2356 (!) 51     Resp 11/21/18 2356 (!) 22     Temp 11/21/18 2356 (!) 97.4 F (36.3 C)     Temp Source 11/21/18 2356 Oral     SpO2 11/21/18 2356 96 %     Weight 11/22/18 0002 244 lb (110.7 kg)     Height 11/22/18 0002 6' (1.829 m)     Head Circumference --      Peak Flow --      Pain Score 11/22/18 0001 6     Pain Loc --      Pain Edu? --      Excl. in [place]? --     Constitutional: Alert and oriented. Well appearing and in no apparent distress. HEENT:      Head: Normocephalic and atraumatic.         Eyes: Conjunctivae are normal. Sclera is non-icteric.       Mouth/Throat: Mucous membranes are moist.       Neck: Supple with no signs of meningismus. Cardiovascular: Regular rate and rhythm. No murmurs, gallops, or rubs. 2+ symmetrical distal pulses are present in all extremities. No JVD. Respiratory: Normal respiratory effort. Lungs are clear to auscultation bilaterally. No wheezes, crackles, or rhonchi.  Gastrointestinal: Abdomen is significantly distended with no tenderness to palpation, with positive bowel sounds. No rebound or  guarding. Musculoskeletal: Nontender with normal range of motion in all extremities. No edema, cyanosis, or erythema of extremities. Neurologic: Normal speech and language. Face is symmetric. Moving all extremities. No gross focal neurologic deficits are appreciated. Skin: Skin is warm, dry and intact. No rash noted. Psychiatric: Mood and affect are normal. Speech and behavior are normal.  ____________________________________________   LABS (all labs ordered are listed, but only abnormal results are displayed)  Labs Reviewed  COMPREHENSIVE METABOLIC PANEL - Abnormal; Notable for the following components:      Result Value   Sodium 126 (*)    Chloride 91 (*)    Glucose, Bld 174 (*)    BUN 39 (*)    Creatinine, Ser 6.74 (*)  Calcium 7.6 (*)    Albumin 3.3 (*)    GFR calc non Af Amer 7 (*)    GFR calc Af Amer 9 (*)    All other components within normal limits  CBC WITH DIFFERENTIAL/PLATELET - Abnormal; Notable for the following components:   RBC 3.60 (*)    Hemoglobin 10.6 (*)    HCT 31.9 (*)    Monocytes Absolute 1.3 (*)    All other components within normal limits  LIPASE, BLOOD - Abnormal; Notable for the following components:   Lipase 52 (*)    All other components within normal limits  TROPONIN I (HIGH SENSITIVITY)   ____________________________________________  EKG  ED ECG REPORT I, Rudene Re, the attending physician, personally viewed and interpreted this ECG.  Normal sinus rhythm with bigeminy PACs, rate of 71, borderline prolonged QTC, normal axis, no ST elevations or depressions.  Frequent PACs are new when compared to prior.  03:19 -sinus bradycardia, rate of 51, first-degree AV block, normal QTC, normal axis, no ST elevations or depressions.  Resolution of frequent PACs ____________________________________________  RADIOLOGY  I have personally reviewed the images performed during this visit and I agree with the Radiologist's  read.   Interpretation by Radiologist:  Dg Abdomen 1 View  Result Date: 11/22/2018 CLINICAL DATA:  Abdominal distension, shortness of breath for 2 days, peritoneal dialysis EXAM: ABDOMEN - 1 VIEW COMPARISON:  CT abdomen pelvis 10/17/2012 FINDINGS: Gaseous distention of the stomach, nonspecific. Splenic artery calcification noted in the left upper quadrant. Cholecystectomy clips in the right upper quadrant. Catheter tubing projects in the midline upper abdomen. Additional tubing projects over the right hemiabdomen. Monitor wires also project over the abdomen. No expected calcification. Nonobstructive bowel gas pattern. No acute osseous abnormality. Mild degenerative changes in the spine and hips. IMPRESSION: Nonspecific gaseous distention of the stomach, otherwise nonobstructive bowel gas pattern. Electronically Signed   By: Lovena Le M.D.   On: 11/22/2018 02:50   Dg Chest Portable 1 View  Result Date: 11/22/2018 CLINICAL DATA:  Shortness of breath for 2 days acutely worsening EXAM: PORTABLE CHEST 1 VIEW COMPARISON:  Radiograph February 14, 2018 FINDINGS: Bandlike areas of bibasilar subsegmental atelectasis. No consolidation, features of edema, pneumothorax, or effusion. Pulmonary vascularity is normally distributed. The cardiomediastinal contours are unremarkable. No acute osseous or soft tissue abnormality. IMPRESSION: Bandlike bibasilar opacities, likely subsegmental atelectasis. No other acute cardiopulmonary abnormality Electronically Signed   By: Lovena Le M.D.   On: 11/22/2018 00:57     ____________________________________________   PROCEDURES  Procedure(s) performed: None Procedures Critical Care performed:  None ____________________________________________   INITIAL IMPRESSION / ASSESSMENT AND PLAN / ED COURSE  73 y.o. male with a history of end-stage renal disease on peritoneal dialysis, diabetes, gastritis, hypertension who presents for evaluation of shortness of breath and  chest pain.  Patient has significant abdominal distention which is most likely the cause of his pain and shortness of breath.  He has normal work of breathing and satting 96 to 100% on room air.  Chest x-ray with no evidence of pulmonary edema or pneumonia.  EKG does show frequent PACs but no evidence of ischemic changes.  Clinically there are no signs of peritonitis with no tenderness, no abdominal pain, no fever, normal white count. Clinically no signs of SBO with patient passing flatus, no nausea or vomiting and normal KUB.  I discussed patient's presentation, labs, vitals, and findings on my exam with Dr. Juleen China who is on-call for nephrology.  He recommended giving  a dose of pain medication here and asking patient to drain the dialysate at home to help with the abdominal distention and the shortness of breath and to follow-up in his office in the morning for further management.  I discussed this plan with patient and his wife and they are both comfortable with it.  Patient received 2000 g of Tylenol and 5 mg of oxycodone.  Wife is a former Marine scientist and is comfortable draining the dialysate at home.  Discussed standard return precautions for worsening shortness of breath, abdominal pain, fever, or vomiting.       As part of my medical decision making, I reviewed the following data within the [place] History obtained from family, Nursing notes reviewed and incorporated, Labs reviewed , EKG interpreted , Old EKG reviewed, Old chart reviewed, Radiograph reviewed , A consult was requested and obtained from this/these consultant(s) Nephorlogy, Notes from prior ED visits and [place] Controlled Substance Database   Patient was evaluated in Emergency Department today for the symptoms described in the history of present illness. Patient was evaluated in the context of the global COVID-19 pandemic, which necessitated consideration that the patient might be at risk for infection with the SARS-CoV-2  virus that causes COVID-19. Institutional protocols and algorithms that pertain to the evaluation of patients at risk for COVID-19 are in a state of rapid change based on information released by regulatory bodies including the CDC and federal and state organizations. These policies and algorithms were followed during the patient's care in the ED.   ____________________________________________   FINAL CLINICAL IMPRESSION(S) / ED DIAGNOSES   Final diagnoses:  Abdominal distention  Shortness of breath      NEW MEDICATIONS STARTED DURING THIS VISIT:  ED Discharge Orders    None       Note:  This document was prepared using Dragon voice recognition software and may include unintentional dictation errors.    Alfred Levins, [place], MD 11/22/18 Bond, [place], MD 11/22/18 [phone]

## 2018-11-22 NOTE — Discharge Instructions (Addendum)
Call Dr. Candiss Norse this morning for close follow-up.  Draining the dialysate when you get home.  Return to the emergency room if you have worsening shortness of breath, new or diffuse abdominal pain, vomiting, or any new symptoms concerning to you.

## 2018-11-24 ENCOUNTER — Ambulatory Visit
Admission: RE | Admit: 2018-11-24 | Discharge: 2018-11-24 | Disposition: A | Discharge status: Home / Self Care | Payer: Medicare Other | Source: Ambulatory Visit | Attending: Infectious Diseases | Admitting: Infectious Diseases

## 2018-11-24 ENCOUNTER — Other Ambulatory Visit: Payer: Self-pay | Admitting: Infectious Diseases

## 2018-11-24 ENCOUNTER — Other Ambulatory Visit: Payer: Self-pay

## 2018-11-24 DIAGNOSIS — N320 Bladder-neck obstruction: Secondary | ICD-10-CM

## 2018-11-24 DIAGNOSIS — R350 Frequency of micturition: Secondary | ICD-10-CM | POA: Diagnosis not present

## 2018-12-23 ENCOUNTER — Emergency Department
Admission: EM | Admit: 2018-12-23 | Discharge: 2018-12-23 | Disposition: A | Discharge status: Home / Self Care | Payer: Medicare Other | Attending: Emergency Medicine | Admitting: Emergency Medicine

## 2018-12-23 ENCOUNTER — Encounter: Payer: Self-pay | Admitting: Emergency Medicine

## 2018-12-23 ENCOUNTER — Other Ambulatory Visit: Payer: Self-pay

## 2018-12-23 ENCOUNTER — Emergency Department: Payer: Medicare Other

## 2018-12-23 DIAGNOSIS — Z7984 Long term (current) use of oral hypoglycemic drugs: Secondary | ICD-10-CM | POA: Insufficient documentation

## 2018-12-23 DIAGNOSIS — E119 Type 2 diabetes mellitus without complications: Secondary | ICD-10-CM | POA: Diagnosis not present

## 2018-12-23 DIAGNOSIS — Z79899 Other long term (current) drug therapy: Secondary | ICD-10-CM | POA: Insufficient documentation

## 2018-12-23 DIAGNOSIS — I10 Essential (primary) hypertension: Secondary | ICD-10-CM | POA: Diagnosis not present

## 2018-12-23 DIAGNOSIS — Z8673 Personal history of transient ischemic attack (TIA), and cerebral infarction without residual deficits: Secondary | ICD-10-CM | POA: Insufficient documentation

## 2018-12-23 DIAGNOSIS — R339 Retention of urine, unspecified: Secondary | ICD-10-CM

## 2018-12-23 DIAGNOSIS — Z87891 Personal history of nicotine dependence: Secondary | ICD-10-CM | POA: Insufficient documentation

## 2018-12-23 DIAGNOSIS — Z7901 Long term (current) use of anticoagulants: Secondary | ICD-10-CM | POA: Insufficient documentation

## 2018-12-23 DIAGNOSIS — Z7982 Long term (current) use of aspirin: Secondary | ICD-10-CM | POA: Diagnosis not present

## 2018-12-23 LAB — URINALYSIS, COMPLETE (UACMP) WITH MICROSCOPIC
Bacteria, UA: NONE SEEN
Bilirubin Urine: NEGATIVE
Glucose, UA: NEGATIVE mg/dL
Hgb urine dipstick: NEGATIVE
Ketones, ur: NEGATIVE mg/dL
Leukocytes,Ua: NEGATIVE
Nitrite: NEGATIVE
Protein, ur: 100 mg/dL — AB
Specific Gravity, Urine: 1.005 (ref 1.005–1.030)
WBC, UA: NONE SEEN WBC/hpf (ref 0–5)
pH: 7 (ref 5.0–8.0)

## 2018-12-23 LAB — CBC WITH DIFFERENTIAL/PLATELET
Abs Immature Granulocytes: 0.03 10*3/uL (ref 0.00–0.07)
Basophils Absolute: 0 10*3/uL (ref 0.0–0.1)
Basophils Relative: 1 %
Eosinophils Absolute: 0.2 10*3/uL (ref 0.0–0.5)
Eosinophils Relative: 3 %
HCT: 34.9 % — ABNORMAL LOW (ref 39.0–52.0)
Hemoglobin: 11.7 g/dL — ABNORMAL LOW (ref 13.0–17.0)
Immature Granulocytes: 0 %
Lymphocytes Relative: 19 %
Lymphs Abs: 1.4 10*3/uL (ref 0.7–4.0)
MCH: 29.5 pg (ref 26.0–34.0)
MCHC: 33.5 g/dL (ref 30.0–36.0)
MCV: 88.1 fL (ref 80.0–100.0)
Monocytes Absolute: 0.8 10*3/uL (ref 0.1–1.0)
Monocytes Relative: 10 %
Neutro Abs: 5.2 10*3/uL (ref 1.7–7.7)
Neutrophils Relative %: 67 %
Platelets: 142 10*3/uL — ABNORMAL LOW (ref 150–400)
RBC: 3.96 MIL/uL — ABNORMAL LOW (ref 4.22–5.81)
RDW: 13.2 % (ref 11.5–15.5)
WBC: 7.7 10*3/uL (ref 4.0–10.5)
nRBC: 0 % (ref 0.0–0.2)

## 2018-12-23 LAB — BASIC METABOLIC PANEL
Anion gap: 10 (ref 5–15)
BUN: 46 mg/dL — ABNORMAL HIGH (ref 8–23)
CO2: 23 mmol/L (ref 22–32)
Calcium: 8.5 mg/dL — ABNORMAL LOW (ref 8.9–10.3)
Chloride: 104 mmol/L (ref 98–111)
Creatinine, Ser: 5.41 mg/dL — ABNORMAL HIGH (ref 0.61–1.24)
GFR calc Af Amer: 11 mL/min — ABNORMAL LOW (ref >60)
GFR calc non Af Amer: 10 mL/min — ABNORMAL LOW (ref >60)
Glucose, Bld: 149 mg/dL — ABNORMAL HIGH (ref 70–99)
Potassium: 4 mmol/L (ref 3.5–5.1)
Sodium: 137 mmol/L (ref 135–145)

## 2018-12-23 NOTE — ED Provider Notes (Signed)
[facility] Emergency Department Provider Note   ____________________________________________   First MD Initiated Contact with Patient 12/23/18 [phone]     (approximate)  I have reviewed the triage vital signs and the nursing notes.   HISTORY  Chief Complaint Urinary Retention    HPI Tyler Wiley is a 73 y.o. male who presents to the ED from home with a chief complaint of urinary retention.  Patient has ESRD on peritoneal dialysis who still urinates.  He has had retention twice previously, both after her surgeries.  Symptoms started 2 days ago when patient did a larger volume diasylate.  Has an appointment with urology today at 1 PM but has not urinated in the past 6 to 8 hours.  Denies fever, cough, chest pain, shortness of breath, nausea or vomiting.       Past Medical History:  Diagnosis Date  . Diabetes mellitus without complication ([place])   . Gastritis   . Hypertension   . Renal disorder   . Stroke [facility])     Patient Active Problem List   Diagnosis Date Noted  . TIA (transient ischemic attack) 05/09/2018  . 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 Hyperkalemia 02/14/2018    Past Surgical History:  Procedure Laterality Date  . CHOLECYSTECTOMY      Prior to Admission medications   Medication Sig Start Date End Date Taking? Authorizing Provider  albuterol (VENTOLIN HFA) 108 (90 Base) MCG/ACT inhaler Inhale 1-2 puffs into the lungs every 4 (four) hours as needed for wheezing or shortness of breath.    [provider]  amLODipine (NORVASC) 10 MG tablet Take 1 tablet (10 mg total) by mouth daily. 05/08/18   Mayo, Pete Pelt, MD  aspirin EC 81 MG EC tablet Take 1 tablet (81 mg total) by mouth daily. Patient not taking: Reported on 11/22/2018 05/09/18   Mayo, Pete Pelt, MD  atorvastatin (LIPITOR) 40 MG tablet Take 1 tablet (40 mg total) by mouth daily at 6 PM. 05/08/18   Mayo, Pete Pelt, MD  carvedilol (COREG) 25 MG tablet Take 25 mg by mouth 2  (two) times daily with a meal.     [provider]  clopidogrel (PLAVIX) 75 MG tablet Take 75 mg by mouth every evening.     [provider]  fluticasone (FLONASE) 50 MCG/ACT nasal spray Place 2 sprays into both nostrils daily. Patient taking differently: Place 2 sprays into both nostrils daily as needed.  05/08/18 11/22/18  Mayo, Pete Pelt, MD  glimepiride (AMARYL) 2 MG tablet Take 2 mg by mouth daily with breakfast.    [provider]  hydrALAZINE (APRESOLINE) 25 MG tablet Take 1 tablet (25 mg total) by mouth 3 (three) times daily. 05/08/18   Mayo, Pete Pelt, MD  Lacosamide (VIMPAT) 100 MG TABS Take 1 tablet (100 mg total) by mouth 2 (two) times daily. Patient not taking: Reported on 11/22/2018 05/09/18   Merlyn Lot, MD  sitaGLIPtin (JANUVIA) 100 MG tablet Take 100 mg by mouth daily.    [provider]  sodium zirconium cyclosilicate (LOKELMA) 10 g PACK packet Take 10 g by mouth daily. Patient not taking: Reported on 11/22/2018 02/15/18   Fritzi Mandes, MD  tamsulosin (FLOMAX) 0.4 MG CAPS capsule Take 1 capsule (0.4 mg total) by mouth daily. Patient not taking: Reported on 11/22/2018 10/18/18   Cuthriell, Charline Bills, PA-C  torsemide (DEMADEX) 10 MG tablet Take 10 mg by mouth daily.    [provider]    Allergies  Patient has no known allergies.  Family History  Problem Relation Age of Onset  . CAD Father     Social History Social History   Tobacco Use  . Smoking status: Former Research scientist (life sciences)  . Smokeless tobacco: Never Used  Substance Use Topics  . Alcohol use: Yes    Comment: Social   . Drug use: Never    Review of Systems  Constitutional: No fever/chills Eyes: No visual changes. ENT: No sore throat. Cardiovascular: Denies chest pain. Respiratory: Denies shortness of breath. Gastrointestinal: Positive for suprapubic abdominal pain.  No nausea, no vomiting.  No diarrhea.  No constipation. Genitourinary: Positive for retention.  Negative for  dysuria. Musculoskeletal: Negative for back pain. Skin: Negative for rash. Neurological: Negative for headaches, focal weakness or numbness.   ____________________________________________   PHYSICAL EXAM:  VITAL SIGNS: ED Triage Vitals  Enc Vitals Group     BP 12/23/18 0316 135/80     Pulse Rate 12/23/18 0316 74     Resp 12/23/18 0316 20     Temp 12/23/18 0316 97.7 F (36.5 C)     Temp Source 12/23/18 0316 Oral     SpO2 12/23/18 0316 99 %     Weight 12/23/18 0317 235 lb (106.6 kg)     Height 12/23/18 0317 6' (1.829 m)     Head Circumference --      Peak Flow --      Pain Score 12/23/18 0317 5     Pain Loc --      Pain Edu? --      Excl. in [place]? --     Constitutional: Alert and oriented. Well appearing and in no acute distress. Eyes: Conjunctivae are normal. PERRL. EOMI. Head: Atraumatic. Nose: No congestion/rhinnorhea. Mouth/Throat: Mucous membranes are moist.  Oropharynx non-erythematous. Neck: No stridor.   Cardiovascular: Normal rate, regular rhythm. Grossly normal heart sounds.  Good peripheral circulation. Respiratory: Normal respiratory effort.  No retractions. Lungs CTAB. Gastrointestinal: Soft and nontender. No distention. No abdominal bruits. No CVA tenderness.  PD catheter in place. Musculoskeletal: No lower extremity tenderness nor edema.  No joint effusions. Neurologic:  Normal speech and language. No gross focal neurologic deficits are appreciated. No gait instability. Skin:  Skin is warm, dry and intact. No rash noted. Psychiatric: Mood and affect are normal. Speech and behavior are normal.  ____________________________________________   LABS (all labs ordered are listed, but only abnormal results are displayed)  Labs Reviewed  CBC WITH DIFFERENTIAL/PLATELET - Abnormal; Notable for the following components:      Result Value   RBC 3.96 (*)    Hemoglobin 11.7 (*)    HCT 34.9 (*)    Platelets 142 (*)    All other components within normal limits   BASIC METABOLIC PANEL - Abnormal; Notable for the following components:   Glucose, Bld 149 (*)    BUN 46 (*)    Creatinine, Ser 5.41 (*)    Calcium 8.5 (*)    GFR calc non Af Amer 10 (*)    GFR calc Af Amer 11 (*)    All other components within normal limits  URINALYSIS, COMPLETE (UACMP) WITH MICROSCOPIC - Abnormal; Notable for the following components:   Color, Urine STRAW (*)    APPearance CLEAR (*)    Protein, ur 100 (*)    All other components within normal limits  URINE CULTURE   ____________________________________________  EKG  None ____________________________________________  RADIOLOGY  ED MD interpretation: Unremarkable KUB  Official radiology report(s): Dg Abdomen  1 View  Result Date: 12/23/2018 CLINICAL DATA:  Urinary retention EXAM: ABDOMEN - 1 VIEW COMPARISON:  Abdominal CT 10/17/2012 FINDINGS: Teacup catheter terminates over the left lower quadrant. No abnormal soft tissue density in the pelvis. Cholecystectomy clips and arterial calcification. Bowel gas pattern is normal. IMPRESSION: Unremarkable KUB. Electronically Signed   By: Monte Fantasia M.D.   On: 12/23/2018 04:37    ____________________________________________   PROCEDURES  Procedure(s) performed (including Critical Care):  Procedures   ____________________________________________   INITIAL IMPRESSION / ASSESSMENT AND PLAN / ED COURSE  As part of my medical decision making, I reviewed the following data within the [place] History obtained from family, Nursing notes reviewed and incorporated, Labs reviewed, Old chart reviewed, Radiograph reviewed, Notes from prior ED visits    MIROSLAV GIN was evaluated in Emergency Department on 12/23/2018 for the symptoms described in the history of present illness. He was evaluated in the context of the global COVID-19 pandemic, which necessitated consideration that the patient might be at risk for infection with the SARS-CoV-2 virus  that causes COVID-19. Institutional protocols and algorithms that pertain to the evaluation of patients at risk for COVID-19 are in a state of rapid change based on information released by regulatory bodies including the CDC and federal and state organizations. These policies and algorithms were followed during the patient's care in the ED.   73 year old male on peritoneal dialysis who presents with urinary retention, progressive over the past 2 days.  Differential diagnosis includes but is not limited to retention secondary to BPH, infection, constipation, increased PD volume, etc.  Nursing performed bladder scan which indicates greater than 320 cc in the bladder.  Patient's wife is a Marine scientist and states last time this happened he only had 30 cc in his bladder.  They agreed to try a Foley catheter.  Will check basic lab work and UA.   Clinical Course as of Dec 23 546  Fri Dec 23, 2018  0509 Patient resting in no acute distress.  Foley catheter draining clear urine.  Updated patient and spouse on all test results.  Patient already on Flomax; will defer additional medications to urology.  Will discharge home with Foley catheter in place.  Patient's urology appointment is at 1 PM today.  Strict return precautions given.  Both verbalized understanding and agree with plan of care.   [JS]    Clinical Course User Index [JS] Paulette Blanch, MD     ____________________________________________   FINAL CLINICAL IMPRESSION(S) / ED DIAGNOSES  Final diagnoses:  Urinary retention     ED Discharge Orders    None       Note:  This document was prepared using Dragon voice recognition software and may include unintentional dictation errors.   Paulette Blanch, MD 12/23/18 [phone]

## 2018-12-23 NOTE — ED Triage Notes (Addendum)
Patient ambulatory to triage with steady gait, without difficulty or distress noted, mask in place; pt urinary retention for last 6-8hrs; st hx of same; st began noticing symptoms 2 days ago and made appt with urology tomorrow at 1pm but couldn't wait until then; peritoneal dialysis cath in place

## 2018-12-23 NOTE — Discharge Instructions (Addendum)
Keep Foley catheter in place until you see your urologist.  Return to the ER for worsening symptoms, persistent vomiting, fever or other concerns.

## 2018-12-24 LAB — URINE CULTURE: Culture: <10000 — AB

## 2019-01-09 ENCOUNTER — Ambulatory Visit: Payer: Self-pay | Admitting: Urology

## 2019-01-13 ENCOUNTER — Other Ambulatory Visit: Payer: Self-pay | Admitting: Urology

## 2019-01-17 ENCOUNTER — Other Ambulatory Visit
Admission: RE | Admit: 2019-01-17 | Discharge: 2019-01-17 | Disposition: A | Discharge status: Home / Self Care | Payer: Medicare Other | Source: Ambulatory Visit | Attending: Urology | Admitting: Urology

## 2019-01-17 ENCOUNTER — Other Ambulatory Visit: Payer: Self-pay

## 2019-01-17 DIAGNOSIS — Z01812 Encounter for preprocedural laboratory examination: Secondary | ICD-10-CM | POA: Insufficient documentation

## 2019-01-17 DIAGNOSIS — Z20828 Contact with and (suspected) exposure to other viral communicable diseases: Secondary | ICD-10-CM | POA: Insufficient documentation

## 2019-01-17 LAB — SARS CORONAVIRUS 2 (TAT 6-24 HRS): SARS Coronavirus 2: NEGATIVE

## 2019-01-17 NOTE — Patient Instructions (Signed)
DUE TO COVID-19 ONLY ONE VISITOR IS ALLOWED TO COME WITH YOU AND STAY IN THE WAITING ROOM ONLY DURING PRE OP AND PROCEDURE DAY OF SURGERY. THE 1 VISITOR MAY VISIT WITH YOU AFTER SURGERY IN YOUR PRIVATE ROOM DURING VISITING HOURS ONLY!  YOU NEED TO HAVE A COVID 19 TEST ON_______ @_______ , THIS TEST MUST BE DONE BEFORE SURGERY, COME  Tyler Wiley, Tyler Wiley , [id number].  ([place]) ONCE YOUR COVID TEST IS COMPLETED, PLEASE BEGIN THE QUARANTINE INSTRUCTIONS AS OUTLINED IN YOUR HANDOUT.                BARTLETT ENKE  01/17/2019   Your procedure is scheduled on: 01-20-19   Report to [facility] Main  Entrance   Report to admitting at      [phone] AM     Call this number if you have problems the morning of surgery [phone]    Remember: Do not eat food or drink liquids :After Midnight.  BRUSH YOUR TEETH MORNING OF SURGERY AND RINSE YOUR MOUTH OUT, NO CHEWING GUM CANDY OR MINTS.     Take these medicines the morning of surgery with A SIP OF WATER: carvedilol,amlodipine, inhaler and bring with you  DO NOT TAKE ANY DIABETIC MEDICATIONS DAY OF YOUR SURGERY    How to Manage Your Diabetes Before and After Surgery  Why is it important to control my blood sugar before and after surgery? . Improving blood sugar levels before and after surgery helps healing and can limit problems. . A way of improving blood sugar control is eating a healthy diet by: o  Eating less sugar and carbohydrates o  Increasing activity/exercise o  Talking with your doctor about reaching your blood sugar goals . High blood sugars (greater than 180 mg/dL) can raise your risk of infections and slow your recovery, so you will need to focus on controlling your diabetes during the weeks before surgery. . Make sure that the doctor who takes care of your diabetes knows about your planned surgery including the date and location.  How do I manage my blood sugar before surgery? . Check your blood  sugar at least 4 times a day, starting 2 days before surgery, to make sure that the level is not too high or low. o Check your blood sugar the morning of your surgery when you wake up and every 2 hours until you get to the Short Stay unit. . If your blood sugar is less than 70 mg/dL, you will need to treat for low blood sugar: o Do not take insulin. o Treat a low blood sugar (less than 70 mg/dL) with  cup of clear juice (cranberry or apple), 4 glucose tablets, OR glucose gel. o Recheck blood sugar in 15 minutes after treatment (to make sure it is greater than 70 mg/dL). If your blood sugar is not greater than 70 mg/dL on recheck, call [phone] for further instructions. . Report your blood sugar to the short stay nurse when you get to Short Stay.  . If you are admitted to the hospital after surgery: o Your blood sugar will be checked by the staff and you will probably be given insulin after surgery (instead of oral diabetes medicines) to make sure you have good blood sugar levels. o The goal for blood sugar control after surgery is 80-180 mg/dL.   WHAT DO I DO ABOUT MY DIABETES MEDICATION?  Marland Kitchen Do not take oral diabetes medicines (pills) the morning of surgery.  You may not have any metal on your body including hair pins and              piercings  Do not wear jewelry,  lotions, powders or perfumes, deodorant              Men may shave face and neck.   Do not bring valuables to the hospital. [place].  Contacts, dentures or bridgework may not be worn into surgery.       Patients discharged the day of surgery will not be allowed to drive home. IF YOU ARE HAVING SURGERY AND GOING HOME THE SAME DAY, YOU MUST HAVE AN ADULT TO DRIVE YOU HOME AND BE WITH YOU FOR 24 HOURS. YOU MAY GO HOME BY TAXI OR UBER OR ORTHERWISE, BUT AN ADULT MUST ACCOMPANY YOU HOME AND STAY WITH YOU FOR 24 HOURS.  Name and phone number of your  driver:  Special Instructions: N/A              Please read over the following fact sheets you were given: _____________________________________________________________________             [facility] - Preparing for Surgery Before surgery, you can play an important role.  Because skin is not sterile, your skin needs to be as free of germs as possible.  You can reduce the number of germs on your skin by washing with CHG (chlorahexidine gluconate) soap before surgery.  CHG is an antiseptic cleaner which kills germs and bonds with the skin to continue killing germs even after washing. Please DO NOT use if you have an allergy to CHG or antibacterial soaps.  If your skin becomes reddened/irritated stop using the CHG and inform your nurse when you arrive at Short Stay. Do not shave (including legs and underarms) for at least 48 hours prior to the first CHG shower.  You may shave your face/neck. Please follow these instructions carefully:  1.  Shower with CHG Soap the night before surgery and the  morning of Surgery.  2.  If you choose to wash your hair, wash your hair first as usual with your  normal  shampoo.  3.  After you shampoo, rinse your hair and body thoroughly to remove the  shampoo.                           4.  Use CHG as you would any other liquid soap.  You can apply chg directly  to the skin and wash                       Gently with a scrungie or clean washcloth.  5.  Apply the CHG Soap to your body ONLY FROM THE NECK DOWN.   Do not use on face/ open                           Wound or open sores. Avoid contact with eyes, ears mouth and genitals (private parts).                       Wash face,  Genitals (private parts) with your normal soap.             6.  Wash thoroughly, paying special attention  to the area where your surgery  will be performed.  7.  Thoroughly rinse your body with warm water from the neck down.  8.  DO NOT shower/wash with your normal soap after using and rinsing  off  the CHG Soap.                9.  Pat yourself dry with a clean towel.            10.  Wear clean pajamas.            11.  Place clean sheets on your bed the night of your first shower and do not  sleep with pets. Day of Surgery : Do not apply any lotions/deodorants the morning of surgery.  Please wear clean clothes to the hospital/surgery center.  FAILURE TO FOLLOW THESE INSTRUCTIONS MAY RESULT IN THE CANCELLATION OF YOUR SURGERY PATIENT SIGNATURE_________________________________  NURSE SIGNATURE__________________________________  ________________________________________________________________________

## 2019-01-18 ENCOUNTER — Other Ambulatory Visit: Payer: Self-pay

## 2019-01-18 ENCOUNTER — Encounter (HOSPITAL_COMMUNITY): Payer: Self-pay

## 2019-01-18 ENCOUNTER — Encounter (HOSPITAL_COMMUNITY)
Admission: RE | Admit: 2019-01-18 | Discharge: 2019-01-18 | Disposition: A | Discharge status: Home / Self Care | Payer: Medicare Other | Source: Ambulatory Visit | Attending: Urology | Admitting: Urology

## 2019-01-18 DIAGNOSIS — D291 Benign neoplasm of prostate: Secondary | ICD-10-CM | POA: Insufficient documentation

## 2019-01-18 DIAGNOSIS — Z01812 Encounter for preprocedural laboratory examination: Secondary | ICD-10-CM | POA: Insufficient documentation

## 2019-01-18 HISTORY — DX: Chronic kidney disease, stage 4 (severe): N18.4

## 2019-01-18 LAB — HEMOGLOBIN A1C
Hgb A1c MFr Bld: 5.9 % — ABNORMAL HIGH (ref 4.8–5.6)
Mean Plasma Glucose: 122.63 mg/dL

## 2019-01-18 LAB — GLUCOSE, CAPILLARY: Glucose-Capillary: 173 mg/dL — ABNORMAL HIGH (ref 70–99)

## 2019-01-18 NOTE — Progress Notes (Addendum)
PCP - Dr. Frazier Richards Cardiologist - n/a  Chest x-ray - 11/22/2018 EKG - 11/22/2018 Stress Test -  ECHO - 05/09/2018 Cardiac Cath -   Sleep Study - Had one years ago, does not wear CPAP CPAP -   Fasting Blood Sugar - 173 Checks Blood Sugar __2-3___ times a day HgbA1C results pending.  Blood Thinner Instructions:Plavix stopped 01/11/2019 per Dr. Alyson Ingles Aspirin Instructions:n/a Last Dose:  Anesthesia review: Given to Tyler Felix, PA for review.   Patient denies shortness of breath, fever, cough and chest pain at PAT appointment   Patient verbalized understanding of instructions that were given to them at the PAT appointment. Patient was also instructed that they will need to review over the PAT instructions again at home before surgery.

## 2019-01-18 NOTE — Progress Notes (Signed)
SPOKE W/  _ [place]     SCREENING SYMPTOMS OF COVID 19:   COUGH-- NO  RUNNY NOSE--- NO  SORE THROAT---NO  NASAL CONGESTION----NO  SNEEZING----NO  SHORTNESS OF BREATH---NO  DIFFICULTY BREATHING---NO  TEMP >100.0 -----NO  UNEXPLAINED BODY ACHES------NO  CHILLS -------- NO  HEADACHES ---------NO  LOSS OF SMELL/ TASTE --------NO    HAVE YOU OR ANY FAMILY MEMBER TRAVELLED PAST 14 DAYS OUT OF THE NO  COUNTY---YES STATE----NO COUNTRY----NO  HAVE YOU OR ANY FAMILY MEMBER BEEN EXPOSED TO ANYONE WITH COVID 19? NO

## 2019-01-19 ENCOUNTER — Encounter (HOSPITAL_COMMUNITY): Payer: Self-pay

## 2019-01-20 ENCOUNTER — Encounter (HOSPITAL_COMMUNITY): Payer: Self-pay | Admitting: *Deleted

## 2019-01-20 ENCOUNTER — Ambulatory Visit (HOSPITAL_COMMUNITY): Payer: Medicare Other | Admitting: Anesthesiology

## 2019-01-20 ENCOUNTER — Encounter (HOSPITAL_COMMUNITY)
Admission: RE | Disposition: A | Discharge status: Home / Self Care | Payer: Self-pay | Source: Home / Self Care | Attending: Urology

## 2019-01-20 ENCOUNTER — Ambulatory Visit (HOSPITAL_COMMUNITY): Payer: Medicare Other | Admitting: Physician Assistant

## 2019-01-20 ENCOUNTER — Ambulatory Visit (HOSPITAL_COMMUNITY)
Admission: RE | Admit: 2019-01-20 | Discharge: 2019-01-20 | Disposition: A | Discharge status: Home / Self Care | Payer: Medicare Other | Attending: Urology | Admitting: Urology

## 2019-01-20 DIAGNOSIS — E1122 Type 2 diabetes mellitus with diabetic chronic kidney disease: Secondary | ICD-10-CM | POA: Insufficient documentation

## 2019-01-20 DIAGNOSIS — Z8673 Personal history of transient ischemic attack (TIA), and cerebral infarction without residual deficits: Secondary | ICD-10-CM | POA: Insufficient documentation

## 2019-01-20 DIAGNOSIS — R338 Other retention of urine: Secondary | ICD-10-CM | POA: Diagnosis present

## 2019-01-20 DIAGNOSIS — N184 Chronic kidney disease, stage 4 (severe): Secondary | ICD-10-CM | POA: Insufficient documentation

## 2019-01-20 DIAGNOSIS — Z87891 Personal history of nicotine dependence: Secondary | ICD-10-CM | POA: Diagnosis not present

## 2019-01-20 DIAGNOSIS — N401 Enlarged prostate with lower urinary tract symptoms: Secondary | ICD-10-CM | POA: Insufficient documentation

## 2019-01-20 DIAGNOSIS — I129 Hypertensive chronic kidney disease with stage 1 through stage 4 chronic kidney disease, or unspecified chronic kidney disease: Secondary | ICD-10-CM | POA: Insufficient documentation

## 2019-01-20 DIAGNOSIS — N138 Other obstructive and reflux uropathy: Secondary | ICD-10-CM | POA: Diagnosis not present

## 2019-01-20 HISTORY — PX: CYSTOSCOPY WITH INSERTION OF UROLIFT: SHX6678

## 2019-01-20 LAB — BASIC METABOLIC PANEL
Anion gap: 10 (ref 5–15)
BUN: 36 mg/dL — ABNORMAL HIGH (ref 8–23)
CO2: 23 mmol/L (ref 22–32)
Calcium: 8.4 mg/dL — ABNORMAL LOW (ref 8.9–10.3)
Chloride: 105 mmol/L (ref 98–111)
Creatinine, Ser: 4.39 mg/dL — ABNORMAL HIGH (ref 0.61–1.24)
GFR calc Af Amer: 14 mL/min — ABNORMAL LOW (ref >60)
GFR calc non Af Amer: 12 mL/min — ABNORMAL LOW (ref >60)
Glucose, Bld: 152 mg/dL — ABNORMAL HIGH (ref 70–99)
Potassium: 4.1 mmol/L (ref 3.5–5.1)
Sodium: 138 mmol/L (ref 135–145)

## 2019-01-20 LAB — CBC
HCT: 37.2 % — ABNORMAL LOW (ref 39.0–52.0)
Hemoglobin: 12 g/dL — ABNORMAL LOW (ref 13.0–17.0)
MCH: 29.4 pg (ref 26.0–34.0)
MCHC: 32.3 g/dL (ref 30.0–36.0)
MCV: 91.2 fL (ref 80.0–100.0)
Platelets: 177 10*3/uL (ref 150–400)
RBC: 4.08 MIL/uL — ABNORMAL LOW (ref 4.22–5.81)
RDW: 14 % (ref 11.5–15.5)
WBC: 9.2 10*3/uL (ref 4.0–10.5)
nRBC: 0 % (ref 0.0–0.2)

## 2019-01-20 LAB — PROTIME-INR
INR: 1 (ref 0.8–1.2)
Prothrombin Time: 12.8 seconds (ref 11.4–15.2)

## 2019-01-20 LAB — GLUCOSE, CAPILLARY
Glucose-Capillary: 148 mg/dL — ABNORMAL HIGH (ref 70–99)
Glucose-Capillary: 128 mg/dL — ABNORMAL HIGH (ref 70–99)

## 2019-01-20 SURGERY — CYSTOSCOPY WITH INSERTION OF UROLIFT
Anesthesia: General

## 2019-01-20 MED ORDER — OXYCODONE HCL 5 MG/5ML PO SOLN: 5.0000 mg | Freq: Once | ORAL | Status: AC | PRN

## 2019-01-20 MED ORDER — LACTATED RINGERS IV SOLN: INTRAVENOUS | Status: DC

## 2019-01-20 MED ORDER — MIDAZOLAM HCL 5 MG/5ML IJ SOLN
INTRAMUSCULAR | Status: DC | PRN
  Administered 2019-01-20 (×2): 1 mg via INTRAVENOUS

## 2019-01-20 MED ORDER — ONDANSETRON HCL 4 MG/2ML IJ SOLN
INTRAMUSCULAR | Status: DC | PRN
  Administered 2019-01-20: 4 mg via INTRAVENOUS

## 2019-01-20 MED ORDER — SODIUM CHLORIDE 0.9 % IR SOLN
Status: DC | PRN
  Administered 2019-01-20: 3000 mL

## 2019-01-20 MED ORDER — LIDOCAINE HCL (CARDIAC) PF 100 MG/5ML IV SOSY
PREFILLED_SYRINGE | INTRAVENOUS | Status: DC | PRN
  Administered 2019-01-20: 60 mg via INTRAVENOUS

## 2019-01-20 MED ORDER — BELLADONNA ALKALOIDS-OPIUM 16.2-60 MG RE SUPP
1.0000 | Freq: Once | RECTAL | Status: AC
  Administered 2019-01-20: 14:00:00 1 via RECTAL

## 2019-01-20 MED ORDER — EPHEDRINE SULFATE 50 MG/ML IJ SOLN
INTRAMUSCULAR | Status: DC | PRN
  Administered 2019-01-20 (×2): 5 mg via INTRAVENOUS

## 2019-01-20 MED ORDER — HYDROMORPHONE HCL 1 MG/ML IJ SOLN: 0.2500 mg | INTRAMUSCULAR | Status: DC | PRN

## 2019-01-20 MED ORDER — OXYCODONE HCL 5 MG PO TABS
ORAL_TABLET | ORAL | Status: AC
  Filled 2019-01-20: qty 1

## 2019-01-20 MED ORDER — TRAMADOL HCL 50 MG PO TABS: 50.0000 mg | ORAL_TABLET | Freq: Four times a day (QID) | ORAL | 0 refills | Status: AC | PRN

## 2019-01-20 MED ORDER — MIDAZOLAM HCL 2 MG/2ML IJ SOLN
INTRAMUSCULAR | Status: AC
  Filled 2019-01-20: qty 2

## 2019-01-20 MED ORDER — FENTANYL CITRATE (PF) 100 MCG/2ML IJ SOLN
INTRAMUSCULAR | Status: DC | PRN
  Administered 2019-01-20 (×2): 50 ug via INTRAVENOUS

## 2019-01-20 MED ORDER — CEFAZOLIN SODIUM-DEXTROSE 2-4 GM/100ML-% IV SOLN
INTRAVENOUS | Status: AC
  Filled 2019-01-20: qty 100

## 2019-01-20 MED ORDER — EPHEDRINE 5 MG/ML INJ
INTRAVENOUS | Status: AC
  Filled 2019-01-20: qty 10

## 2019-01-20 MED ORDER — FENTANYL CITRATE (PF) 100 MCG/2ML IJ SOLN
INTRAMUSCULAR | Status: AC
  Filled 2019-01-20: qty 2

## 2019-01-20 MED ORDER — ONDANSETRON HCL 4 MG/2ML IJ SOLN
INTRAMUSCULAR | Status: AC
  Filled 2019-01-20: qty 2

## 2019-01-20 MED ORDER — PROPOFOL 10 MG/ML IV BOLUS
INTRAVENOUS | Status: AC
  Filled 2019-01-20: qty 20

## 2019-01-20 MED ORDER — SODIUM CHLORIDE 0.9 % IV SOLN
INTRAVENOUS | Status: DC
  Administered 2019-01-20: 10:00:00 via INTRAVENOUS

## 2019-01-20 MED ORDER — CEFAZOLIN SODIUM-DEXTROSE 2-4 GM/100ML-% IV SOLN
2.0000 g | INTRAVENOUS | Status: AC
  Administered 2019-01-20: 12:00:00 2 g via INTRAVENOUS

## 2019-01-20 MED ORDER — PROPOFOL 10 MG/ML IV BOLUS
INTRAVENOUS | Status: DC | PRN
  Administered 2019-01-20: 170 mg via INTRAVENOUS

## 2019-01-20 MED ORDER — STERILE WATER FOR IRRIGATION IR SOLN
Status: DC | PRN
  Administered 2019-01-20: 1000 mL

## 2019-01-20 MED ORDER — BELLADONNA ALKALOIDS-OPIUM 16.2-60 MG RE SUPP
RECTAL | Status: AC
  Administered 2019-01-20: 14:00:00 1 via RECTAL
  Filled 2019-01-20: qty 1

## 2019-01-20 MED ORDER — PROMETHAZINE HCL 25 MG/ML IJ SOLN: 6.2500 mg | INTRAMUSCULAR | Status: DC | PRN

## 2019-01-20 MED ORDER — OXYCODONE HCL 5 MG PO TABS
5.0000 mg | ORAL_TABLET | Freq: Once | ORAL | Status: AC | PRN
  Administered 2019-01-20: 5 mg via ORAL

## 2019-01-20 SURGICAL SUPPLY — 15 items
BAG URINE DRAINAGE (UROLOGICAL SUPPLIES) ×6 IMPLANT
BAG URO CATCHER STRL LF (MISCELLANEOUS) ×3 IMPLANT
CATH FOLEY 2WAY SLVR  5CC 16FR (CATHETERS) ×4
CATH FOLEY 2WAY SLVR 5CC 16FR (CATHETERS) ×2 IMPLANT
GLOVE BIO SURGEON STRL SZ8 (GLOVE) ×3 IMPLANT
GOWN STRL REUS W/TWL XL LVL3 (GOWN DISPOSABLE) ×6 IMPLANT
HOLDER FOLEY CATH W/STRAP (MISCELLANEOUS) ×3 IMPLANT
KIT TURNOVER KIT A (KITS) IMPLANT
MANIFOLD NEPTUNE II (INSTRUMENTS) ×3 IMPLANT
PACK CYSTO (CUSTOM PROCEDURE TRAY) ×3 IMPLANT
SYSTEM UROLIFT (Male Continence) ×18 IMPLANT
TUBING CONNECTING 10 (TUBING) ×4 IMPLANT
TUBING CONNECTING 10' (TUBING) ×2
TUBING UROLOGY SET (TUBING) ×3 IMPLANT
WATER STERILE IRR 1000ML POUR (IV SOLUTION) ×3 IMPLANT

## 2019-01-20 NOTE — Anesthesia Procedure Notes (Signed)
Procedure Name: LMA Insertion Date/Time: 01/20/2019 11:53 AM Performed by: Glory Buff, CRNA Pre-anesthesia Checklist: Patient identified, Emergency Drugs available, Suction available and Patient being monitored Patient Re-evaluated:Patient Re-evaluated prior to induction Oxygen Delivery Method: Circle system utilized Preoxygenation: Pre-oxygenation with 100% oxygen Induction Type: IV induction LMA: LMA inserted LMA Size: 5.0 Number of attempts: 1 Placement Confirmation: positive ETCO2 Tube secured with: Tape Dental Injury: Teeth and Oropharynx as per pre-operative assessment

## 2019-01-20 NOTE — Op Note (Signed)
   PREOPERATIVE DIAGNOSIS: Benign prostatic hypertrophy with bladder outlet obstruction.  POSTOPERATIVE DIAGNOSIS: Benign prostatic hypertrophy with bladder outlet obstruction.  PROCEDURE: Cystoscopy with implantation of UroLift devices, 6 implants.  SURGEON: Nicolette Bang, M.D.  ANESTHESIA: General  ANTIBIOTICS: ancef  SPECIMEN: None.  DRAINS: A 16-French Foley catheter.  BLOOD LOSS: Minimal.  COMPLICATIONS: None.  FINDINGS: Bilobar hyperplasia. No median lobe. Open prostatic fossa after 6 implants  INDICATIONS:The Patient is an 73 year old male with BPH and bladder outlet obstruction. He has failed medical therapy and has elected UroLift for definitive treatment.  FINDINGS OF PROCEDURE: He was taken to the operating room where a genral anesthetic was induced. He was placed in lithotomy position and was fitted with PAS hose. His perineum and genitalia were prepped with chlorhexidine, and he was draped in usual sterile fashion.  Cystoscopy was performed using the UroLift scope and 0 degree lens. Examination revealed a normal urethra. The external sphincter was intact. Prostatic urethra was approximately 4 cm in length with lateral lobe enlargement. There was also little bit of bladder neck elevation. Inspection of bladder revealed mild-to-moderate trabeculation with no tumors, stones, or inflammation. No cellules or diverticula were noted. Ureteral orifices were in their normal anatomic position effluxing clear urine.  After initial cystoscopy, the visual obturator was replaced with the first UroLift device. This was turned to the 9 o'clock position and pulled back to the veru and then slightly advanced. Pressure was then applied to the right lateral lobe and the UroLift device was deployed.  The second UroLift device was then inserted and applied to the left lateral lobe at 3 o'clock and deployed in the mid prostatic urethra. After this,  there was still some apparent obstruction closer to the bladder neck. So a second level of UroLift your left device was applied between the mid urethra and the proximal urethra providing further patency to the prostatic urethra. At this point, there was mild bleeding but the patient did have a spinal anesthetic. So it was thought that a Foley catheter was indicated. The scope was removed and a 16-French Foley catheter was inserted without difficulty. The balloon was filled with 10 mL sterile fluid, and the catheter was placed to straight drainage.  COMPLICATIONS: None   CONDITION: Stable, extubated, transferred to PACU  PLAN: The patient will be discharged home and followup in 2 days for a voiding trial.

## 2019-01-20 NOTE — Discharge Instructions (Signed)
Indwelling Urinary Catheter Care, Adult °An indwelling urinary catheter is a thin tube that is put into your bladder. The tube helps to drain pee (urine) out of your body. The tube goes in through your urethra. Your urethra is where pee comes out of your body. Your pee will come out through the catheter, then it will go into a bag (drainage bag). °Take good care of your catheter so it will work well. °How to wear your catheter and bag °Supplies needed °· Sticky tape (adhesive tape) or a leg strap. °· Alcohol wipe or soap and water (if you use tape). °· A clean towel (if you use tape). °· Large overnight bag. °· Smaller bag (leg bag). °Wearing your catheter °Attach your catheter to your leg with tape or a leg strap. °· Make sure the catheter is not pulled tight. °· If a leg strap gets wet, take it off and put on a dry strap. °· If you use tape to hold the bag on your leg: °1. Use an alcohol wipe or soap and water to wash your skin where the tape made it sticky before. °2. Use a clean towel to pat-dry that skin. °3. Use new tape to make the bag stay on your leg. °Wearing your bags °You should have been given a large overnight bag. °· You may wear the overnight bag in the day or night. °· Always have the overnight bag lower than your bladder.  Do not let the bag touch the floor. °· Before you go to sleep, put a clean plastic bag in a wastebasket. Then hang the overnight bag inside the wastebasket. °You should also have a smaller leg bag that fits under your clothes. °· Always wear the leg bag below your knee. °· Do not wear your leg bag at night. °How to care for your skin and catheter °Supplies needed °· A clean washcloth. °· Water and mild soap. °· A clean towel. °Caring for your skin and catheter ° °  ° °· Clean the skin around your catheter every day: °? Wash your hands with soap and water. °? Wet a clean washcloth in warm water and mild soap. °? Clean the skin around your urethra. °? If you are male: °? Gently  spread the folds of skin around your vagina (labia). °? With the washcloth in your other hand, wipe the inner side of your labia on each side. Wipe from front to back. °? If you are male: °? Pull back any skin that covers the end of your penis (foreskin). °? With the washcloth in your other hand, wipe your penis in small circles. Start wiping at the tip of your penis, then move away from the catheter. °? Move the foreskin back in place, if needed. °? With your free hand, hold the catheter close to where it goes into your body. °? Keep holding the catheter during cleaning so it does not get pulled out. °? With the washcloth in your other hand, clean the catheter. °? Only wipe downward on the catheter. °? Do not wipe upward toward your body. Doing this may push germs into your urethra and cause infection. °? Use a clean towel to pat-dry the catheter and the skin around it. Make sure to wipe off all soap. °? Wash your hands with soap and water. °· Shower every day. Do not take baths. °· Do not use cream, ointment, or lotion on the area where the catheter goes into your body, unless your doctor tells you   to. °· Do not use powders, sprays, or lotions on your genital area. °· Check your skin around the catheter every day for signs of infection. Check for: °? Redness, swelling, or pain. °? Fluid or blood. °? Warmth. °? Pus or a bad smell. °How to empty the bag °Supplies needed °· Rubbing alcohol. °· Gauze pad or cotton ball. °· Tape or a leg strap. °Emptying the bag °Pour the pee out of your bag when it is ?-½ full, or at least 2-3 times a day. Do this for your overnight bag and your leg bag. °1. Wash your hands with soap and water. °2. Separate (detach) the bag from your leg. °3. Hold the bag over the toilet or a clean pail. Keep the bag lower than your hips and bladder. This is so the pee (urine) does not go back into the tube. °4. Open the pour spout. It is at the bottom of the bag. °5. Empty the pee into the toilet or  pail. Do not let the pour spout touch any surface. °6. Put rubbing alcohol on a gauze pad or cotton ball. °7. Use the gauze pad or cotton ball to clean the pour spout. °8. Close the pour spout. °9. Attach the bag to your leg with tape or a leg strap. °10. Wash your hands with soap and water. °Follow instructions for cleaning the drainage bag: °· From the product maker. °· As told by your doctor. °How to change the bag °Supplies needed °· Alcohol wipes. °· A clean bag. °· Tape or a leg strap. °Changing the bag °Replace your bag when it starts to leak, smell bad, or look dirty. °1. Wash your hands with soap and water. °2. Separate the dirty bag from your leg. °3. Pinch the catheter with your fingers so that pee does not spill out. °4. Separate the catheter tube from the bag tube where these tubes connect (at the connection valve). Do not let the tubes touch any surface. °5. Clean the end of the catheter tube with an alcohol wipe. Use a different alcohol wipe to clean the end of the bag tube. °6. Connect the catheter tube to the tube of the clean bag. °7. Attach the clean bag to your leg with tape or a leg strap. Do not make the bag tight on your leg. °8. Wash your hands with soap and water. °General rules ° °· Never pull on your catheter. Never try to take it out. Doing that can hurt you. °· Always wash your hands before and after you touch your catheter or bag. Use a mild, fragrance-free soap. If you do not have soap and water, use hand sanitizer. °· Always make sure there are no twists or bends (kinks) in the catheter tube. °· Always make sure there are no leaks in the catheter or bag. °· Drink enough fluid to keep your pee pale yellow. °· Do not take baths, swim, or use a hot tub. °· If you are male, wipe from front to back after you poop (have a bowel movement). °Contact a doctor if: °· Your pee is cloudy. °· Your pee smells worse than usual. °· Your catheter gets clogged. °· Your catheter leaks. °· Your bladder  feels full. °Get help right away if: °· You have redness, swelling, or pain where the catheter goes into your body. °· You have fluid, blood, pus, or a bad smell coming from the area where the catheter goes into your body. °· Your skin feels warm where   the catheter goes into your body. °· You have a fever. °· You have pain in your: °? Belly (abdomen). °? Legs. °? Lower back. °? Bladder. °· You see blood in the catheter. °· Your pee is pink or red. °· You feel sick to your stomach (nauseous). °· You throw up (vomit). °· You have chills. °· Your pee is not draining into the bag. °· Your catheter gets pulled out. °Summary °· An indwelling urinary catheter is a thin tube that is placed into the bladder to help drain pee (urine) out of the body. °· The catheter is placed into the part of the body that drains pee from the bladder (urethra). °· Taking good care of your catheter will keep it working properly and help prevent problems. °· Always wash your hands before and after touching your catheter or bag. °· Never pull on your catheter or try to take it out. °This information is not intended to replace advice given to you by your health care provider. Make sure you discuss any questions you have with your health care provider. °Document Released: 07/18/2012 Document Revised: 07/15/2018 Document Reviewed: 11/06/2016 °Elsevier Patient Education © 2020 [employer] ° ° ° ° ° °General Anesthesia, Adult, Care After °This sheet gives you information about how to care for yourself after your procedure. Your health care provider may also give you more specific instructions. If you have problems or questions, contact your health care provider. °What can I expect after the procedure? °After the procedure, the following side effects are common: °· Pain or discomfort at the IV site. °· Nausea. °· Vomiting. °· Sore throat. °· Trouble concentrating. °· Feeling cold or chills. °· Weak or tired. °· Sleepiness and fatigue. °· Soreness  and body aches. These side effects can affect parts of the body that were not involved in surgery. °Follow these instructions at home: ° °For at least 24 hours after the procedure: °· Have a responsible adult stay with you. It is important to have someone help care for you until you are awake and alert. °· Rest as needed. °· Do not: °? Participate in activities in which you could fall or become injured. °? Drive. °? Use heavy machinery. °? Drink alcohol. °? Take sleeping pills or medicines that cause drowsiness. °? Make important decisions or sign legal documents. °? Take care of children on your own. °Eating and drinking °· Follow any instructions from your health care provider about eating or drinking restrictions. °· When you feel hungry, start by eating small amounts of foods that are soft and easy to digest (bland), such as toast. Gradually return to your regular diet. °· Drink enough fluid to keep your urine pale yellow. °· If you vomit, rehydrate by drinking water, juice, or clear broth. °General instructions °· If you have sleep apnea, surgery and certain medicines can increase your risk for breathing problems. Follow instructions from your health care provider about wearing your sleep device: °? Anytime you are sleeping, including during daytime naps. °? While taking prescription pain medicines, sleeping medicines, or medicines that make you drowsy. °· Return to your normal activities as told by your health care provider. Ask your health care provider what activities are safe for you. °· Take over-the-counter and prescription medicines only as told by your health care provider. °· If you smoke, do not smoke without supervision. °· Keep all follow-up visits as told by your health care provider. This is important. °Contact a health care provider if: °· You have   nausea or vomiting that does not get better with medicine. °· You cannot eat or drink without vomiting. °· You have pain that does not get better with  medicine. °· You are unable to pass urine. °· You develop a skin rash. °· You have a fever. °· You have redness around your IV site that gets worse. °Get help right away if: °· You have difficulty breathing. °· You have chest pain. °· You have blood in your urine or stool, or you vomit blood. °Summary °· After the procedure, it is common to have a sore throat or nausea. It is also common to feel tired. °· Have a responsible adult stay with you for the first 24 hours after general anesthesia. It is important to have someone help care for you until you are awake and alert. °· When you feel hungry, start by eating small amounts of foods that are soft and easy to digest (bland), such as toast. Gradually return to your regular diet. °· Drink enough fluid to keep your urine pale yellow. °· Return to your normal activities as told by your health care provider. Ask your health care provider what activities are safe for you. °This information is not intended to replace advice given to you by your health care provider. Make sure you discuss any questions you have with your health care provider. °Document Released: 06/29/2000 Document Revised: 03/26/2017 Document Reviewed: 11/06/2016 °Elsevier Patient Education © 2020 [employer] Inc. ° °

## 2019-01-20 NOTE — Transfer of Care (Signed)
Immediate Anesthesia Transfer of Care Note  Patient: Tyler Wiley  Procedure(s) Performed: CYSTOSCOPY WITH INSERTION OF UROLIFT (N/A )  Patient Location: PACU  Anesthesia Type:General  Level of Consciousness: drowsy, patient cooperative and responds to stimulation  Airway & Oxygen Therapy: Patient Spontanous Breathing and Patient connected to nasal cannula oxygen  Post-op Assessment: Report given to RN and Post -op Vital signs reviewed and stable  Post vital signs: Reviewed and stable  Last Vitals:  Vitals Value Taken Time  BP 124/79 01/20/19 1236  Temp    Pulse 54 01/20/19 1239  Resp 18 01/20/19 1239  SpO2 100 % 01/20/19 1239  Vitals shown include unvalidated device data.  Last Pain:  Vitals:   01/20/19 0942  TempSrc: Oral         Complications: No apparent anesthesia complications

## 2019-01-20 NOTE — H&P (Signed)
Urology Admission H&P  Chief Complaint: urinary retention  History of Present Illness: Mr Tyler Wiley is a 73yo with a hx of BPH and urinary retention who has failed medical therapy. He is managed with a catheter. He underwent UDS which showed a good capacity and good detrusor function. Cystoscopy revealed bilobar hyperplasia  Past Medical History:  Diagnosis Date  . CKD (chronic kidney disease) stage 4, GFR 15-29 ml/min (HCC)    Peritoneal dialysis  . Diabetes mellitus without complication ([place])   . Gastritis   . Hypertension   . Renal disorder   . Stroke [facility])    Past Surgical History:  Procedure Laterality Date  . CHOLECYSTECTOMY      Home Medications:  Current Facility-Administered Medications  Medication Dose Route Frequency Provider Last Rate Last Dose  . 0.9 %  sodium chloride infusion   Intravenous Continuous Lynda Rainwater, MD 10 mL/hr at 01/20/19 1024    . ceFAZolin (ANCEF) IVPB 2g/100 mL premix  2 g Intravenous 30 min Pre-Op Mertis Mosher, Candee Furbish, MD      . lactated ringers infusion   Intravenous Continuous Lynda Rainwater, MD       Allergies:  Allergies  Allergen Reactions  . Cefuroxime     Other: Mouth ulcers, tongue swollen    Family History  Problem Relation Age of Onset  . CAD Father    Social History:  reports that he has quit smoking. He has never used smokeless tobacco. He reports current alcohol use. He reports that he does not use drugs.  Review of Systems  Genitourinary: Positive for frequency and urgency.  All other systems reviewed and are negative.   Physical Exam:  Vital signs in last 24 hours: Temp:  [98.5 F (36.9 C)] 98.5 F (36.9 C) (10/16 0942) Pulse Rate:  [55] 55 (10/16 0942) Resp:  [18] 18 (10/16 0942) BP: (148)/(73) 148/73 (10/16 0942) SpO2:  [99 %] 99 % (10/16 0942) Physical Exam  Constitutional: He is oriented to person, place, and time. He appears well-developed and well-nourished.  HENT:  Head: Normocephalic and  atraumatic.  Eyes: Pupils are equal, round, and reactive to light. EOM are normal.  Neck: Normal range of motion. No thyromegaly present.  Cardiovascular: Normal rate and regular rhythm.  Respiratory: Effort normal. No respiratory distress.  GI: Soft. He exhibits no distension.  Musculoskeletal: Normal range of motion.        General: No edema.  Neurological: He is alert and oriented to person, place, and time.  Skin: Skin is warm and dry.  Psychiatric: He has a normal mood and affect. His behavior is normal. Judgment and thought content normal.    Laboratory Data:  Results for orders placed or performed during the hospital encounter of 01/20/19 (from the past 24 hour(s))  Glucose, capillary     Status: Abnormal   Collection Time: 01/20/19  9:45 AM  Result Value Ref Range   Glucose-Capillary 148 (H) 70 - 99 mg/dL  Basic metabolic panel     Status: Abnormal   Collection Time: 01/20/19 10:00 AM  Result Value Ref Range   Sodium 138 135 - 145 mmol/L   Potassium 4.1 3.5 - 5.1 mmol/L   Chloride 105 98 - 111 mmol/L   CO2 23 22 - 32 mmol/L   Glucose, Bld 152 (H) 70 - 99 mg/dL   BUN 36 (H) 8 - 23 mg/dL   Creatinine, Ser 4.39 (H) 0.61 - 1.24 mg/dL   Calcium 8.4 (L) 8.9 - 10.3 mg/dL  GFR calc non Af Amer 12 (L) >60 mL/min   GFR calc Af Amer 14 (L) >60 mL/min   Anion gap 10 5 - 15  CBC     Status: Abnormal   Collection Time: 01/20/19 10:00 AM  Result Value Ref Range   WBC 9.2 4.0 - 10.5 K/uL   RBC 4.08 (L) 4.22 - 5.81 MIL/uL   Hemoglobin 12.0 (L) 13.0 - 17.0 g/dL   HCT 37.2 (L) 39.0 - 52.0 %   MCV 91.2 80.0 - 100.0 fL   MCH 29.4 26.0 - 34.0 pg   MCHC 32.3 30.0 - 36.0 g/dL   RDW 14.0 11.5 - 15.5 %   Platelets 177 150 - 400 K/uL   nRBC 0.0 0.0 - 0.2 %  Protime-INR     Status: None   Collection Time: 01/20/19 10:00 AM  Result Value Ref Range   Prothrombin Time 12.8 11.4 - 15.2 seconds   INR 1.0 0.8 - 1.2   Recent Results (from the past 240 hour(s))  SARS CORONAVIRUS 2 (TAT 6-24  HRS) Nasopharyngeal Nasopharyngeal Swab     Status: None   Collection Time: 01/17/19 10:21 AM   Specimen: Nasopharyngeal Swab  Result Value Ref Range Status   SARS Coronavirus 2 NEGATIVE NEGATIVE Final    Comment: (NOTE) SARS-CoV-2 target nucleic acids are NOT DETECTED. The SARS-CoV-2 RNA is generally detectable in upper and lower respiratory specimens during the acute phase of infection. Negative results do not preclude SARS-CoV-2 infection, do not rule out co-infections with other pathogens, and should not be used as the sole basis for treatment or other patient management decisions. Negative results must be combined with clinical observations, patient history, and epidemiological information. The expected result is Negative. Fact Sheet for Patients: SugarRoll.be Fact Sheet for Healthcare Providers: https://www.woods-mathews.com/ This test is not yet approved or cleared by the [place] FDA and  has been authorized for detection and/or diagnosis of SARS-CoV-2 by FDA under an Emergency Use Authorization (EUA). This EUA will remain  in effect (meaning this test can be used) for the duration of the COVID-19 declaration under Section 56 4(b)(1) of the Act, 21 U.S.C. section 360bbb-3(b)(1), unless the authorization is terminated or revoked sooner. Performed at [facility], [place] [address], [place], [place] [address]    Creatinine: Recent Labs    01/20/19 1000  CREATININE 4.39*   Baseline Creatinine: NA  Impression/Assessment:  73yo with BPH with LUTS, urinarr retention  Plan:  The risks/benefits/alternatives to Urolift placement was explained to the patient and he understands and wishes to proceed with surgery  Nicolette Bang 01/20/2019, 11:34 AM

## 2019-01-20 NOTE — Anesthesia Preprocedure Evaluation (Addendum)
Anesthesia Evaluation  Patient identified by MRN, date of birth, ID band Patient awake    Reviewed: Allergy & Precautions, NPO status , Patient's Chart, lab work & pertinent test results, reviewed documented beta blocker date and time   Airway Mallampati: II  TM Distance: >3 FB Neck ROM: Full    Dental no notable dental hx.    Pulmonary neg pulmonary ROS, former smoker,    Pulmonary exam normal breath sounds clear to auscultation       Cardiovascular hypertension, Pt. on medications and Pt. on home beta blockers negative cardio ROS Normal cardiovascular exam Rhythm:Regular Rate:Normal     Neuro/Psych TIACVA negative psych ROS   GI/Hepatic negative GI ROS, Neg liver ROS,   Endo/Other  negative endocrine ROSdiabetes, Type 2  Renal/GU ESRF and DialysisRenal disease  negative genitourinary   Musculoskeletal negative musculoskeletal ROS (+)   Abdominal (+) + obese,   Peds negative pediatric ROS (+)  Hematology negative hematology ROS (+)   Anesthesia Other Findings   Reproductive/Obstetrics negative OB ROS                             Anesthesia Physical Anesthesia Plan  ASA: IV  Anesthesia Plan: General   Post-op Pain Management:    Induction: Intravenous  PONV Risk Score and Plan: 2 and Ondansetron, Midazolam and Treatment may vary due to age or medical condition  Airway Management Planned: LMA  Additional Equipment:   Intra-op Plan:   Post-operative Plan: Extubation in OR  Informed Consent: I have reviewed the patients History and Physical, chart, labs and discussed the procedure including the risks, benefits and alternatives for the proposed anesthesia with the patient or authorized representative who has indicated his/her understanding and acceptance.     Dental advisory given  Plan Discussed with: CRNA  Anesthesia Plan Comments:         Anesthesia Quick  Evaluation

## 2019-01-20 NOTE — Anesthesia Postprocedure Evaluation (Signed)
Anesthesia Post Note  Patient: Tyler Wiley  Procedure(s) Performed: CYSTOSCOPY WITH INSERTION OF UROLIFT (N/A )     Patient location during evaluation: PACU Anesthesia Type: General Level of consciousness: awake and alert Pain management: pain level controlled Vital Signs Assessment: post-procedure vital signs reviewed and stable Respiratory status: spontaneous breathing, nonlabored ventilation and respiratory function stable Cardiovascular status: blood pressure returned to baseline and stable Postop Assessment: no apparent nausea or vomiting Anesthetic complications: no    Last Vitals:  Vitals:   01/20/19 1255 01/20/19 1311  BP:  (!) 150/76  Pulse:  100  Resp:  18  Temp: 36.9 C 36.6 C  SpO2:  98%    Last Pain:  Vitals:   01/20/19 1404  TempSrc:   PainSc: [place]

## 2019-01-23 ENCOUNTER — Encounter (HOSPITAL_COMMUNITY): Payer: Self-pay | Admitting: Urology

## 2019-02-25 ENCOUNTER — Other Ambulatory Visit: Payer: Self-pay

## 2019-02-25 ENCOUNTER — Encounter: Payer: Self-pay | Admitting: Intensive Care

## 2019-02-25 ENCOUNTER — Emergency Department: Payer: Medicare Other

## 2019-02-25 ENCOUNTER — Emergency Department
Admission: EM | Admit: 2019-02-25 | Discharge: 2019-02-25 | Disposition: A | Discharge status: Home / Self Care | Payer: Medicare Other | Attending: Emergency Medicine | Admitting: Emergency Medicine

## 2019-02-25 DIAGNOSIS — R002 Palpitations: Secondary | ICD-10-CM | POA: Diagnosis not present

## 2019-02-25 DIAGNOSIS — N184 Chronic kidney disease, stage 4 (severe): Secondary | ICD-10-CM | POA: Diagnosis not present

## 2019-02-25 DIAGNOSIS — R0789 Other chest pain: Secondary | ICD-10-CM | POA: Diagnosis present

## 2019-02-25 DIAGNOSIS — K2970 Gastritis, unspecified, without bleeding: Secondary | ICD-10-CM | POA: Insufficient documentation

## 2019-02-25 DIAGNOSIS — Z87891 Personal history of nicotine dependence: Secondary | ICD-10-CM | POA: Insufficient documentation

## 2019-02-25 DIAGNOSIS — Z79899 Other long term (current) drug therapy: Secondary | ICD-10-CM | POA: Diagnosis not present

## 2019-02-25 DIAGNOSIS — E1122 Type 2 diabetes mellitus with diabetic chronic kidney disease: Secondary | ICD-10-CM | POA: Insufficient documentation

## 2019-02-25 DIAGNOSIS — Z8673 Personal history of transient ischemic attack (TIA), and cerebral infarction without residual deficits: Secondary | ICD-10-CM | POA: Diagnosis not present

## 2019-02-25 DIAGNOSIS — I129 Hypertensive chronic kidney disease with stage 1 through stage 4 chronic kidney disease, or unspecified chronic kidney disease: Secondary | ICD-10-CM | POA: Diagnosis not present

## 2019-02-25 DIAGNOSIS — Z992 Dependence on renal dialysis: Secondary | ICD-10-CM | POA: Insufficient documentation

## 2019-02-25 LAB — PROTIME-INR
INR: 0.9 (ref 0.8–1.2)
Prothrombin Time: 12.2 seconds (ref 11.4–15.2)

## 2019-02-25 LAB — BASIC METABOLIC PANEL
Anion gap: 11 (ref 5–15)
BUN: 41 mg/dL — ABNORMAL HIGH (ref 8–23)
CO2: 22 mmol/L (ref 22–32)
Calcium: 8.4 mg/dL — ABNORMAL LOW (ref 8.9–10.3)
Chloride: 104 mmol/L (ref 98–111)
Creatinine, Ser: 4.52 mg/dL — ABNORMAL HIGH (ref 0.61–1.24)
GFR calc Af Amer: 14 mL/min — ABNORMAL LOW (ref >60)
GFR calc non Af Amer: 12 mL/min — ABNORMAL LOW (ref >60)
Glucose, Bld: 142 mg/dL — ABNORMAL HIGH (ref 70–99)
Potassium: 4.4 mmol/L (ref 3.5–5.1)
Sodium: 137 mmol/L (ref 135–145)

## 2019-02-25 LAB — TROPONIN I (HIGH SENSITIVITY)
Troponin I (High Sensitivity): 11 ng/L (ref <18)
Troponin I (High Sensitivity): 10 ng/L (ref <18)

## 2019-02-25 LAB — CBC
HCT: 32.8 % — ABNORMAL LOW (ref 39.0–52.0)
Hemoglobin: 11 g/dL — ABNORMAL LOW (ref 13.0–17.0)
MCH: 29 pg (ref 26.0–34.0)
MCHC: 33.5 g/dL (ref 30.0–36.0)
MCV: 86.5 fL (ref 80.0–100.0)
Platelets: 158 10*3/uL (ref 150–400)
RBC: 3.79 MIL/uL — ABNORMAL LOW (ref 4.22–5.81)
RDW: 14.2 % (ref 11.5–15.5)
WBC: 8.9 10*3/uL (ref 4.0–10.5)
nRBC: 0 % (ref 0.0–0.2)

## 2019-02-25 NOTE — ED Notes (Signed)
First Nurse Note: Pt to ED via POV c/o chest pain and indigestion. Pt is currently wearing a Holter monitor. Pt was ambulatory into ED without difficulty. Pt is in NAD at this time.

## 2019-02-25 NOTE — ED Notes (Signed)
Pt states he woke up with indigestion and is currently wearing a heart monitor per his PCP. Pt reported that he was told to come over to the ER if he has any chest pain symptoms. Pt is currently not in any pain, just discomfort.

## 2019-02-25 NOTE — ED Provider Notes (Signed)
[facility] Emergency Department Provider Note   ____________________________________________   First MD Initiated Contact with Patient 02/25/19 [phone]     (approximate)  I have reviewed the triage vital signs and the nursing notes.   HISTORY  Chief Complaint Chest Pain    HPI Tyler Wiley is a 73 y.o. male here for evaluation of what he describes as acid reflux type feeling this morning.  Woke up he had a little bit of belching, felt like indigestion he feeling in his stomach.  Discussed with his wife, also he is recently had a heart monitor for irregular heartbeats.  He came to the ER for further evaluation, does report he is a peritoneal dialysis patient's been compliant with his regimen.  Currently compliant with all of his medications.  No black or bloody stools.  No vomiting.  No fevers or chills.  No exposure to Covid.  Not having any chest pain, reports it was a feeling of an indigestion experience about 6 AM this morning   Past Medical History:  Diagnosis Date  . CKD (chronic kidney disease) stage 4, GFR 15-29 ml/min (HCC)    Peritoneal dialysis  . Diabetes mellitus without complication ([place])   . Gastritis   . Hypertension   . Renal disorder   . Stroke [facility])     Patient Active Problem List   Diagnosis Date Noted  . TIA (transient ischemic attack) 05/09/2018  . 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 Hyperkalemia 02/14/2018    Past Surgical History:  Procedure Laterality Date  . CHOLECYSTECTOMY    . CYSTOSCOPY WITH INSERTION OF UROLIFT N/A 01/20/2019   Procedure: CYSTOSCOPY WITH INSERTION OF UROLIFT;  Surgeon: Cleon Gustin, MD;  Location: [facility] ORS;  Service: Urology;  Laterality: N/A;  80 MINS    Prior to Admission medications   Medication Sig Start Date End Date Taking? Authorizing Provider  albuterol (VENTOLIN HFA) 108 (90 Base) MCG/ACT inhaler Inhale 1-2 puffs into the lungs every 4 (four) hours as needed for wheezing or shortness  of breath.    [provider]  amLODipine (NORVASC) 10 MG tablet Take 1 tablet (10 mg total) by mouth daily. 05/08/18   Mayo, Pete Pelt, MD  atorvastatin (LIPITOR) 40 MG tablet Take 1 tablet (40 mg total) by mouth daily at 6 PM. 05/08/18   Mayo, Pete Pelt, MD  carvedilol (COREG) 12.5 MG tablet Take 12.5 mg by mouth 2 (two) times daily with a meal.     [provider]  clopidogrel (PLAVIX) 75 MG tablet Take 75 mg by mouth every evening.     [provider]  fluticasone (FLONASE) 50 MCG/ACT nasal spray Place 2 sprays into both nostrils daily. Patient taking differently: Place 2 sprays into both nostrils daily as needed for allergies.  05/08/18 01/16/19  Mayo, Pete Pelt, MD  glimepiride (AMARYL) 2 MG tablet Take 2 mg by mouth daily with breakfast.    [provider]  sitaGLIPtin (JANUVIA) 100 MG tablet Take 25 mg by mouth daily.     [provider]  torsemide (DEMADEX) 10 MG tablet Take 10 mg by mouth daily.    [provider]  traMADol (ULTRAM) 50 MG tablet Take 1 tablet (50 mg total) by mouth every 6 (six) hours as needed. 01/20/19 01/20/20  Cleon Gustin, MD    Allergies Cefuroxime  Family History  Problem Relation Age of Onset  . CAD Father     Social History Social History   Tobacco  Use  . Smoking status: Former Smoker    Types: Cigarettes  . Smokeless tobacco: Never Used  Substance Use Topics  . Alcohol use: Yes    Comment: rare  . Drug use: Never    Review of Systems Constitutional: No fever/chills Eyes: No visual changes. ENT: No sore throat. Cardiovascular: Denies chest pain.  See HPI Respiratory: Denies shortness of breath. Gastrointestinal: No abdominal pain.  Reports "indigestion" like feeling and had a little bit of belching earlier Genitourinary: Negative for dysuria. Musculoskeletal: Negative for back pain. Skin: Negative for rash. Neurological: Negative for headaches, areas of focal weakness or  numbness.    ____________________________________________   PHYSICAL EXAM:  VITAL SIGNS: ED Triage Vitals  Enc Vitals Group     BP 02/25/19 0720 (!) 166/74     Pulse Rate 02/25/19 0720 (!) 49     Resp 02/25/19 0720 14     Temp 02/25/19 0720 98 F (36.7 C)     Temp Source 02/25/19 0720 Oral     SpO2 02/25/19 0720 100 %     Weight 02/25/19 0721 232 lb (105.2 kg)     Height 02/25/19 0721 6' (1.829 m)     Head Circumference --      Peak Flow --      Pain Score 02/25/19 0720 3     Pain Loc --      Pain Edu? --      Excl. in [place]? --     Constitutional: Alert and oriented. Well appearing and in no acute distress. Eyes: Conjunctivae are normal. Head: Atraumatic. Nose: No congestion/rhinnorhea. Mouth/Throat: Mucous membranes are moist. Neck: No stridor.  Cardiovascular: Normal rate, regular rhythm. Grossly normal heart sounds.  Good peripheral circulation. Respiratory: Normal respiratory effort.  No retractions. Lungs CTAB. Gastrointestinal: Soft and nontender. No distention.  Peritoneal dialysis catheter clean dry intact. Musculoskeletal: No lower extremity tenderness nor edema. Neurologic:  Normal speech and language. No gross focal neurologic deficits are appreciated.  Skin:  Skin is warm, dry and intact. No rash noted. Psychiatric: Mood and affect are normal. Speech and behavior are normal.  ____________________________________________   LABS (all labs ordered are listed, but only abnormal results are displayed)  Labs Reviewed  BASIC METABOLIC PANEL - Abnormal; Notable for the following components:      Result Value   Glucose, Bld 142 (*)    BUN 41 (*)    Creatinine, Ser 4.52 (*)    Calcium 8.4 (*)    GFR calc non Af Amer 12 (*)    GFR calc Af Amer 14 (*)    All other components within normal limits  CBC - Abnormal; Notable for the following components:   RBC 3.79 (*)    Hemoglobin 11.0 (*)    HCT 32.8 (*)    All other components within normal limits   PROTIME-INR  TROPONIN I (HIGH SENSITIVITY)  TROPONIN I (HIGH SENSITIVITY)   ____________________________________________  EKG  Reviewed and interpreted by me at 715 Heart rate 55 QRS 99 QTc 420 Normal sinus rhythm, occasional PAC.  No evidence of ischemia ____________________________________________  RADIOLOGY  Dg Chest 2 View  Result Date: 02/25/2019 CLINICAL DATA:  Chest pressure EXAM: CHEST - 2 VIEW COMPARISON:  11/22/2018 FINDINGS: The lungs are clear without focal pneumonia, edema, pneumothorax or pleural effusion. The cardiopericardial silhouette is within normal limits for size. The visualized bony structures of the thorax are intact. Telemetry leads overlie the chest. IMPRESSION: No active cardiopulmonary disease. Electronically Signed  By: Misty Stanley M.D.   On: 02/25/2019 08:40    Chest x-ray reviewed negative for acute ____________________________________________   PROCEDURES  Procedure(s) performed: None  Procedures  Critical Care performed: No  ____________________________________________   INITIAL IMPRESSION / ASSESSMENT AND PLAN / ED COURSE  Pertinent labs & imaging results that were available during my care of the patient were reviewed by me and considered in my medical decision making (see chart for details).   Patient presents for evaluation of episode of slight belching, acid reflux-like feeling and possibly palpitations as well as morning.  Patient reports palpitations are not new, currently has cardiac monitor.  No syncope.  No presyncopal episodes.  Denies chest pains reported feels like a acid reflux-like feeling.  He has had recent stress testing in December as well as cardiology follow-up and a recent echo.  Sees Dr. Nehemiah Massed, please refer to most recent cardiology note  Resting comfortably no distress.  Labs reassuring, peritoneal dialysis patient.  Troponin #1 reassuring.  Based on symptomatology clinical history I do not think acute coronary  syndrome is present at this time but will send a second troponin, appears to be low risk by nature of his symptoms.  Reassuring lab work  No acute pulmonary, vascular, neurologic or infectious symptomatology.  Discussed with the patient, comfortable with plan to follow-up with return precautions with cardiology should his second troponin be negative  Tyler Wiley was evaluated in Emergency Department on 02/25/2019 for the symptoms described in the history of present illness. He was evaluated in the context of the global COVID-19 pandemic, which necessitated consideration that the patient might be at risk for infection with the SARS-CoV-2 virus that causes COVID-19. Institutional protocols and algorithms that pertain to the evaluation of patients at risk for COVID-19 are in a state of rapid change based on information released by regulatory bodies including the CDC and federal and state organizations. These policies and algorithms were followed during the patient's care in the ED.      Serial troponin monitoring normal  Return precautions and treatment recommendations and follow-up discussed with the patient who is agreeable with the plan.  ____________________________________________   FINAL CLINICAL IMPRESSION(S) / ED DIAGNOSES  Final diagnoses:  Palpitation  Gastritis without bleeding, unspecified chronicity, unspecified gastritis type        Note:  This document was prepared using Dragon voice recognition software and may include unintentional dictation errors       Delman Kitten, MD 02/25/19 1046

## 2019-02-25 NOTE — ED Triage Notes (Signed)
Pt c/o chest pressure that feels like indigestion. Patient completes his dialysis at home. Access on RLQ. Dialysis patient since August 2020. HX stroke and HTN. A&O x4 in triage. Ambulatory with no problems

## 2019-03-09 ENCOUNTER — Other Ambulatory Visit: Payer: Self-pay | Admitting: Internal Medicine

## 2019-03-09 DIAGNOSIS — I10 Essential (primary) hypertension: Secondary | ICD-10-CM

## 2019-03-09 DIAGNOSIS — M21371 Foot drop, right foot: Secondary | ICD-10-CM

## 2019-03-20 ENCOUNTER — Other Ambulatory Visit: Payer: Self-pay | Admitting: Infectious Diseases

## 2019-03-20 DIAGNOSIS — M21371 Foot drop, right foot: Secondary | ICD-10-CM

## 2019-03-21 ENCOUNTER — Other Ambulatory Visit: Payer: Self-pay | Admitting: Internal Medicine

## 2019-03-21 DIAGNOSIS — M21371 Foot drop, right foot: Secondary | ICD-10-CM

## 2019-04-04 ENCOUNTER — Other Ambulatory Visit: Payer: Self-pay

## 2019-04-04 ENCOUNTER — Ambulatory Visit
Admission: RE | Admit: 2019-04-04 | Discharge: 2019-04-04 | Disposition: A | Discharge status: Home / Self Care | Payer: Medicare Other | Source: Ambulatory Visit | Attending: Internal Medicine | Admitting: Internal Medicine

## 2019-04-04 DIAGNOSIS — M21371 Foot drop, right foot: Secondary | ICD-10-CM

## 2020-01-20 ENCOUNTER — Other Ambulatory Visit: Payer: Self-pay | Admitting: Urology

## 2020-06-17 IMAGING — US US PELVIS LIMITED
1 series · 9 of 9 positions shown · non-contrast
Comparison: 02/15/2018 renal ultrasound

CLINICAL DATA: 73-year-old male with urinary frequency.

EXAM:
LIMITED ULTRASOUND OF PELVIS
TECHNIQUE: Limited transabdominal ultrasound examination of the pelvis was
performed.

[Series 1: us pelvis limited · 0.22mm/px · 9 of 9 slices shown]
[im 1/9]
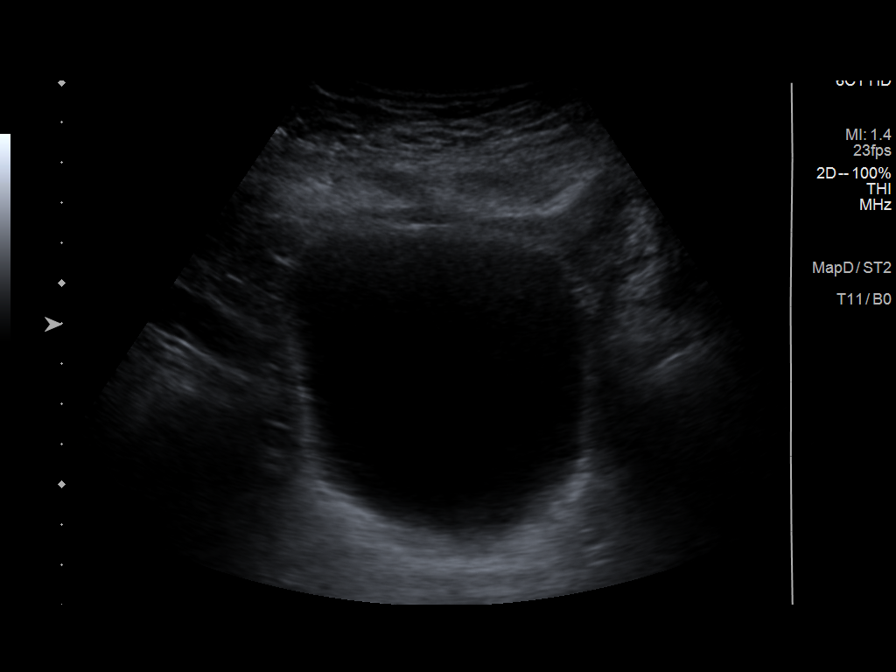
[im 2/9]
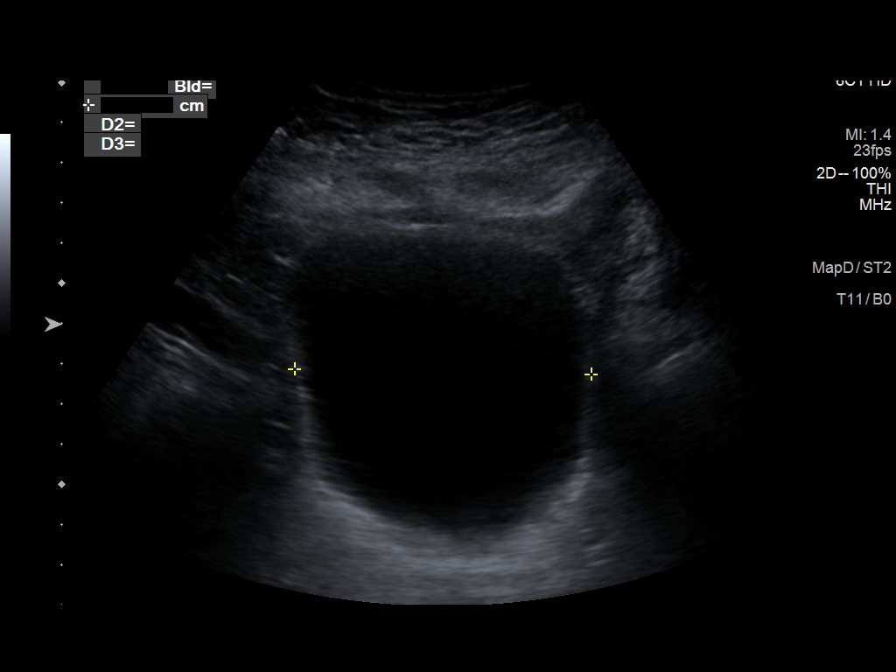
[im 3/9]
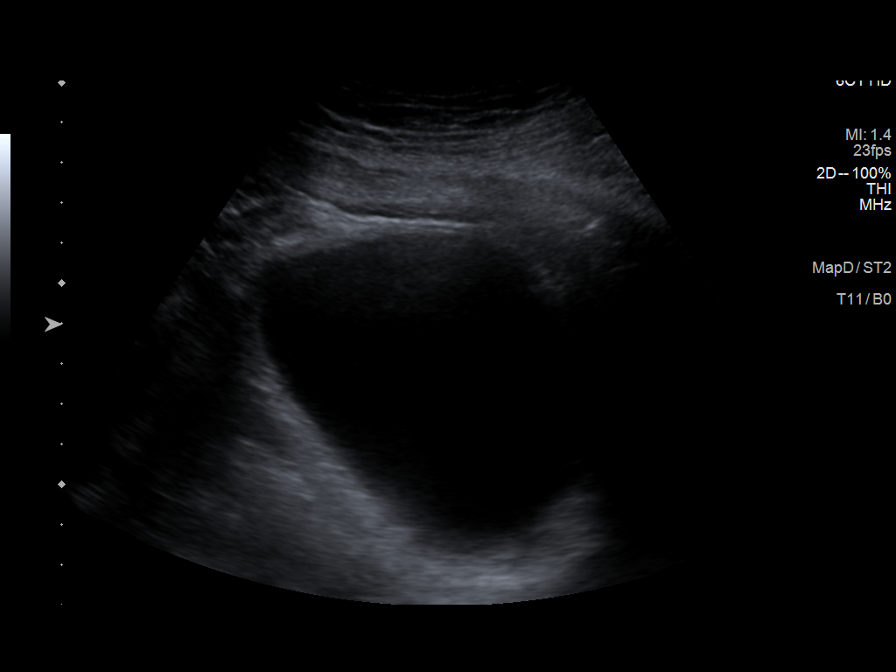
[im 4/9]
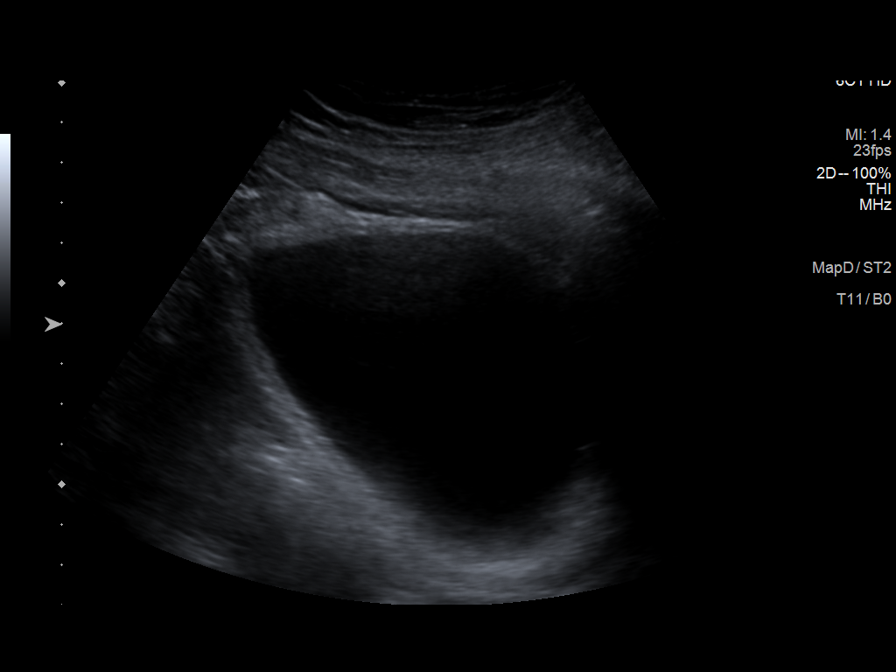
[im 5/9]
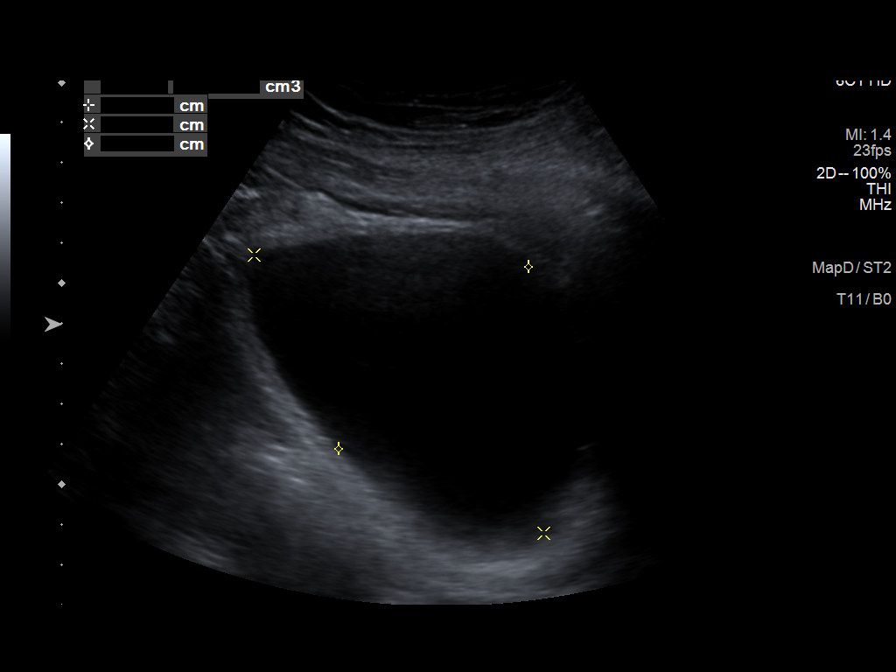
[im 6/9]
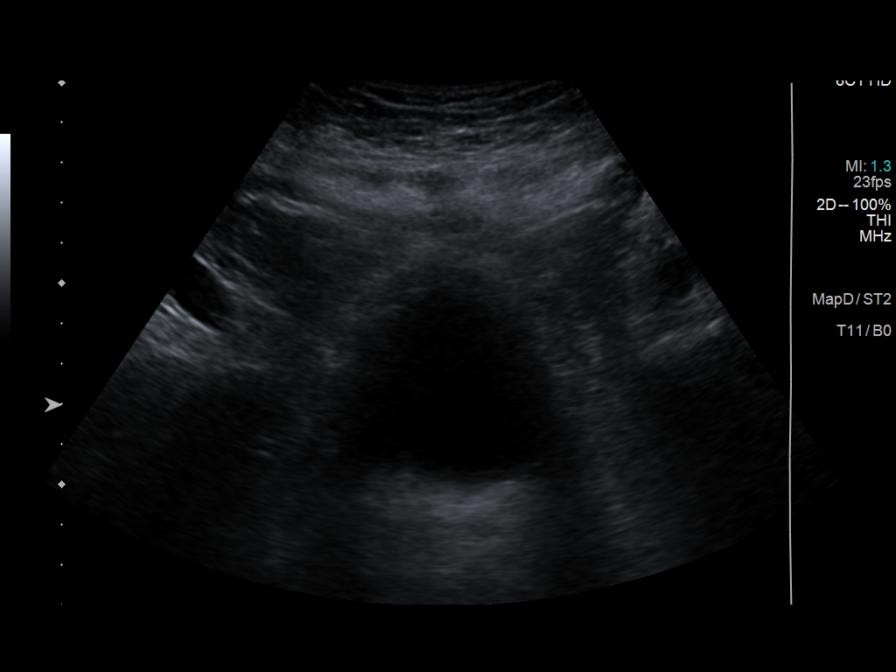
[im 7/9]
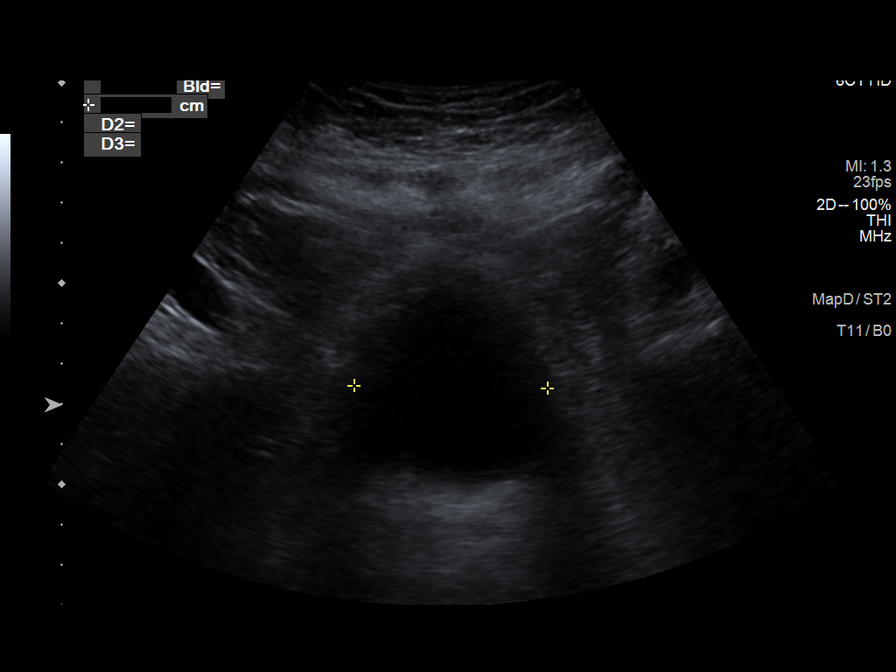
[im 8/9]
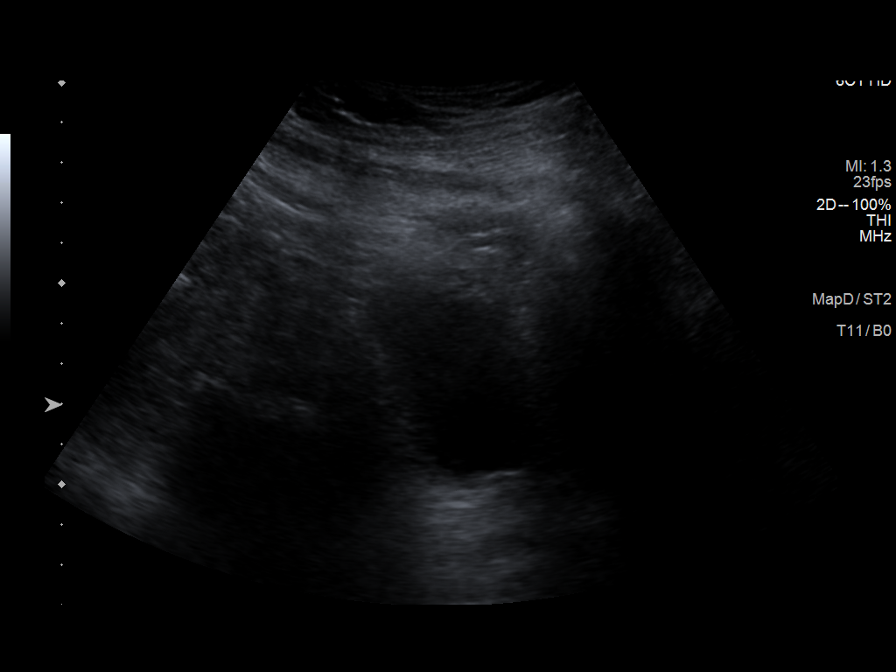
[im 9/9]
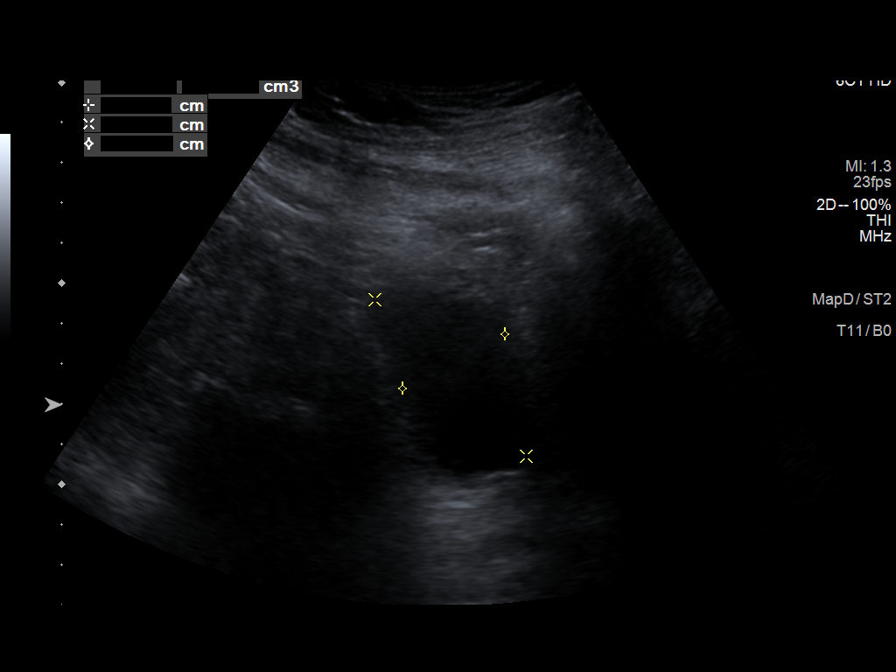

[9 of 9 positions shown; findings below may reference images not displayed]

FINDINGS: The urinary bladder has a normal appearance for the degree of
distention.

Pre-void volume: 251 mL

Post-void volume: 39 mL

Other findings:  None.
IMPRESSION: Unremarkable appearance of the bladder. Postvoid bladder volume of
39 mL.

## 2020-08-07 ENCOUNTER — Other Ambulatory Visit: Payer: Self-pay | Admitting: Urology

## 2020-09-18 IMAGING — CR DG CHEST 2V
1 series · 2 of 2 positions shown · non-contrast
Comparison: 11/22/2018

CLINICAL DATA: Chest pressure

EXAM:
CHEST - 2 VIEW

[Series 1: dg chest 2 view · 0.14mm/px · 2 of 2 slices shown]
[im 1/2]
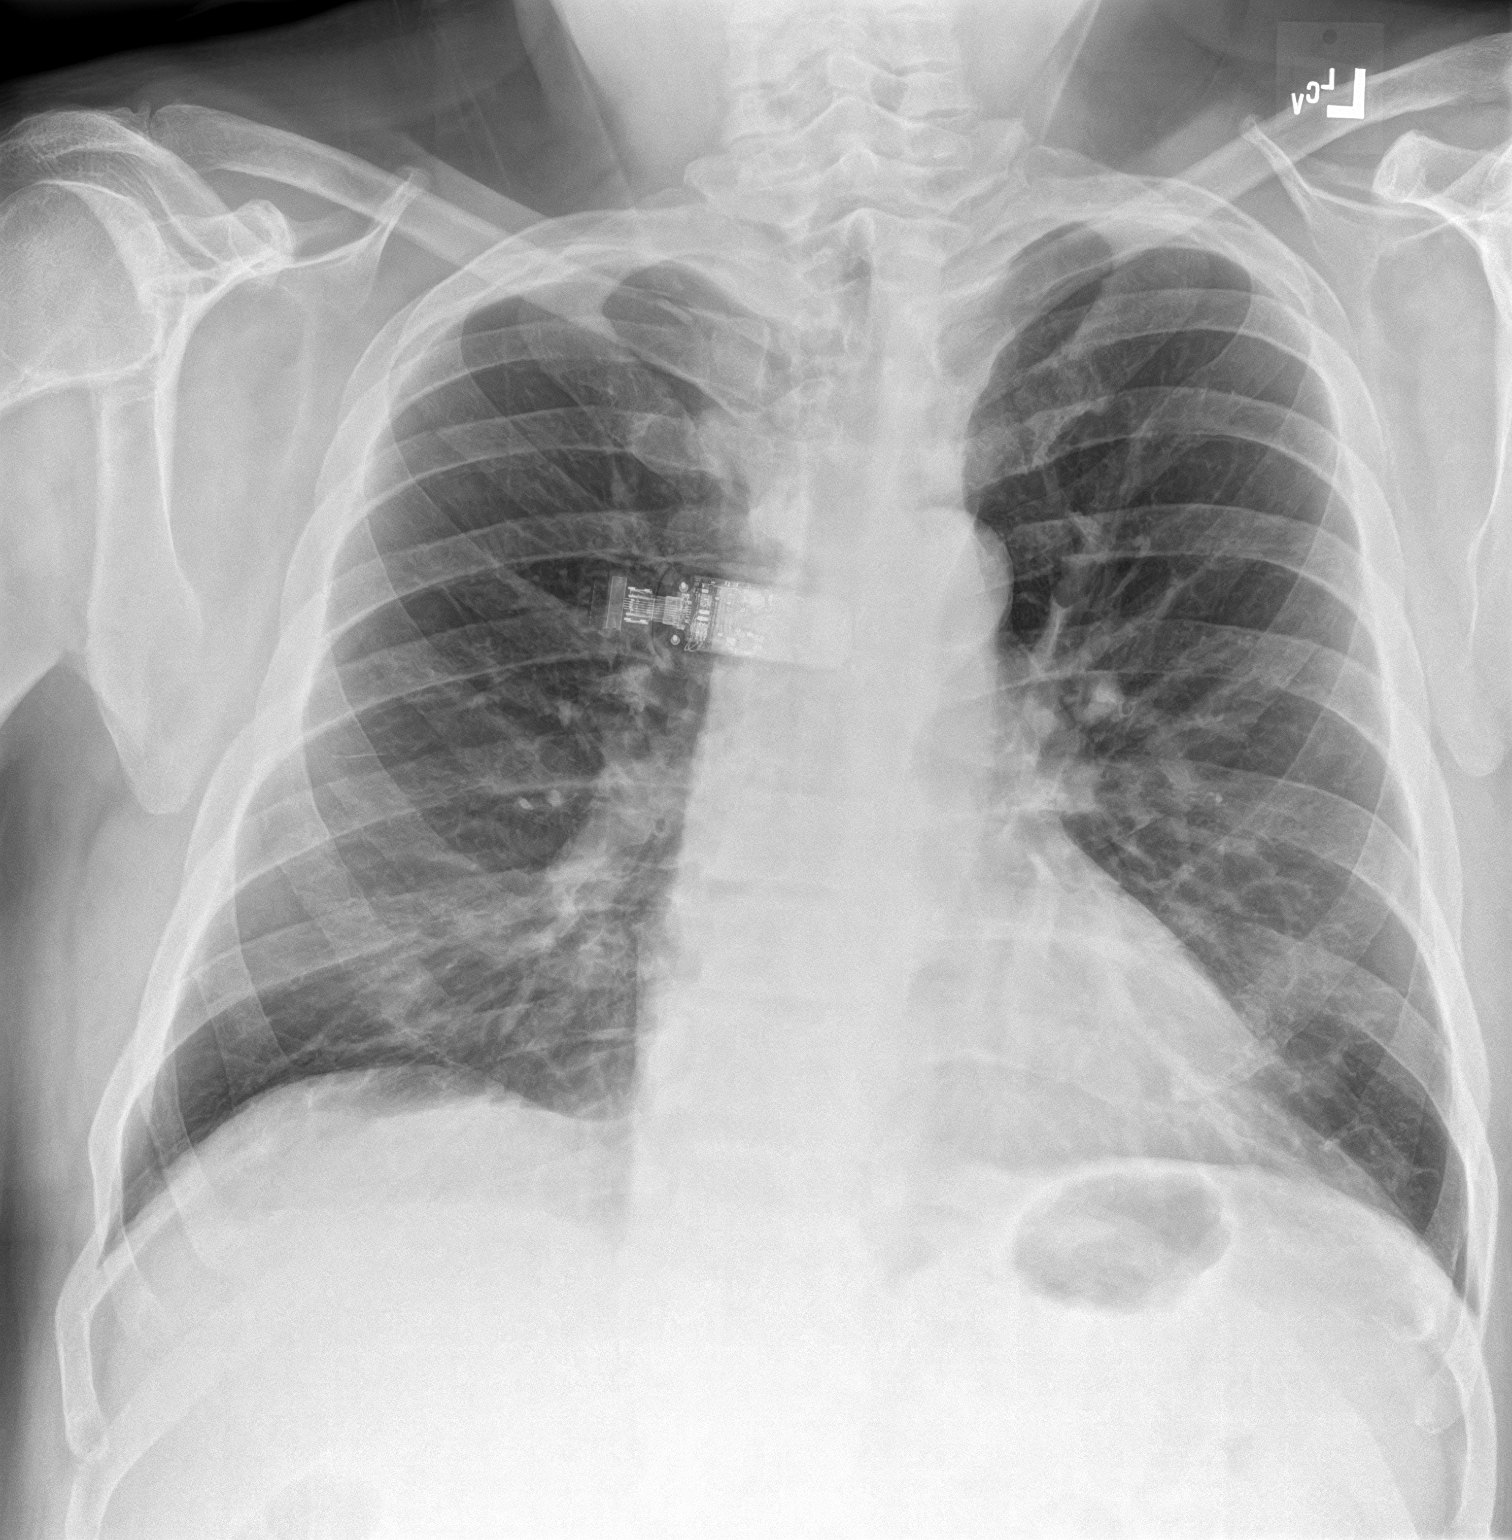
[im 2/2]
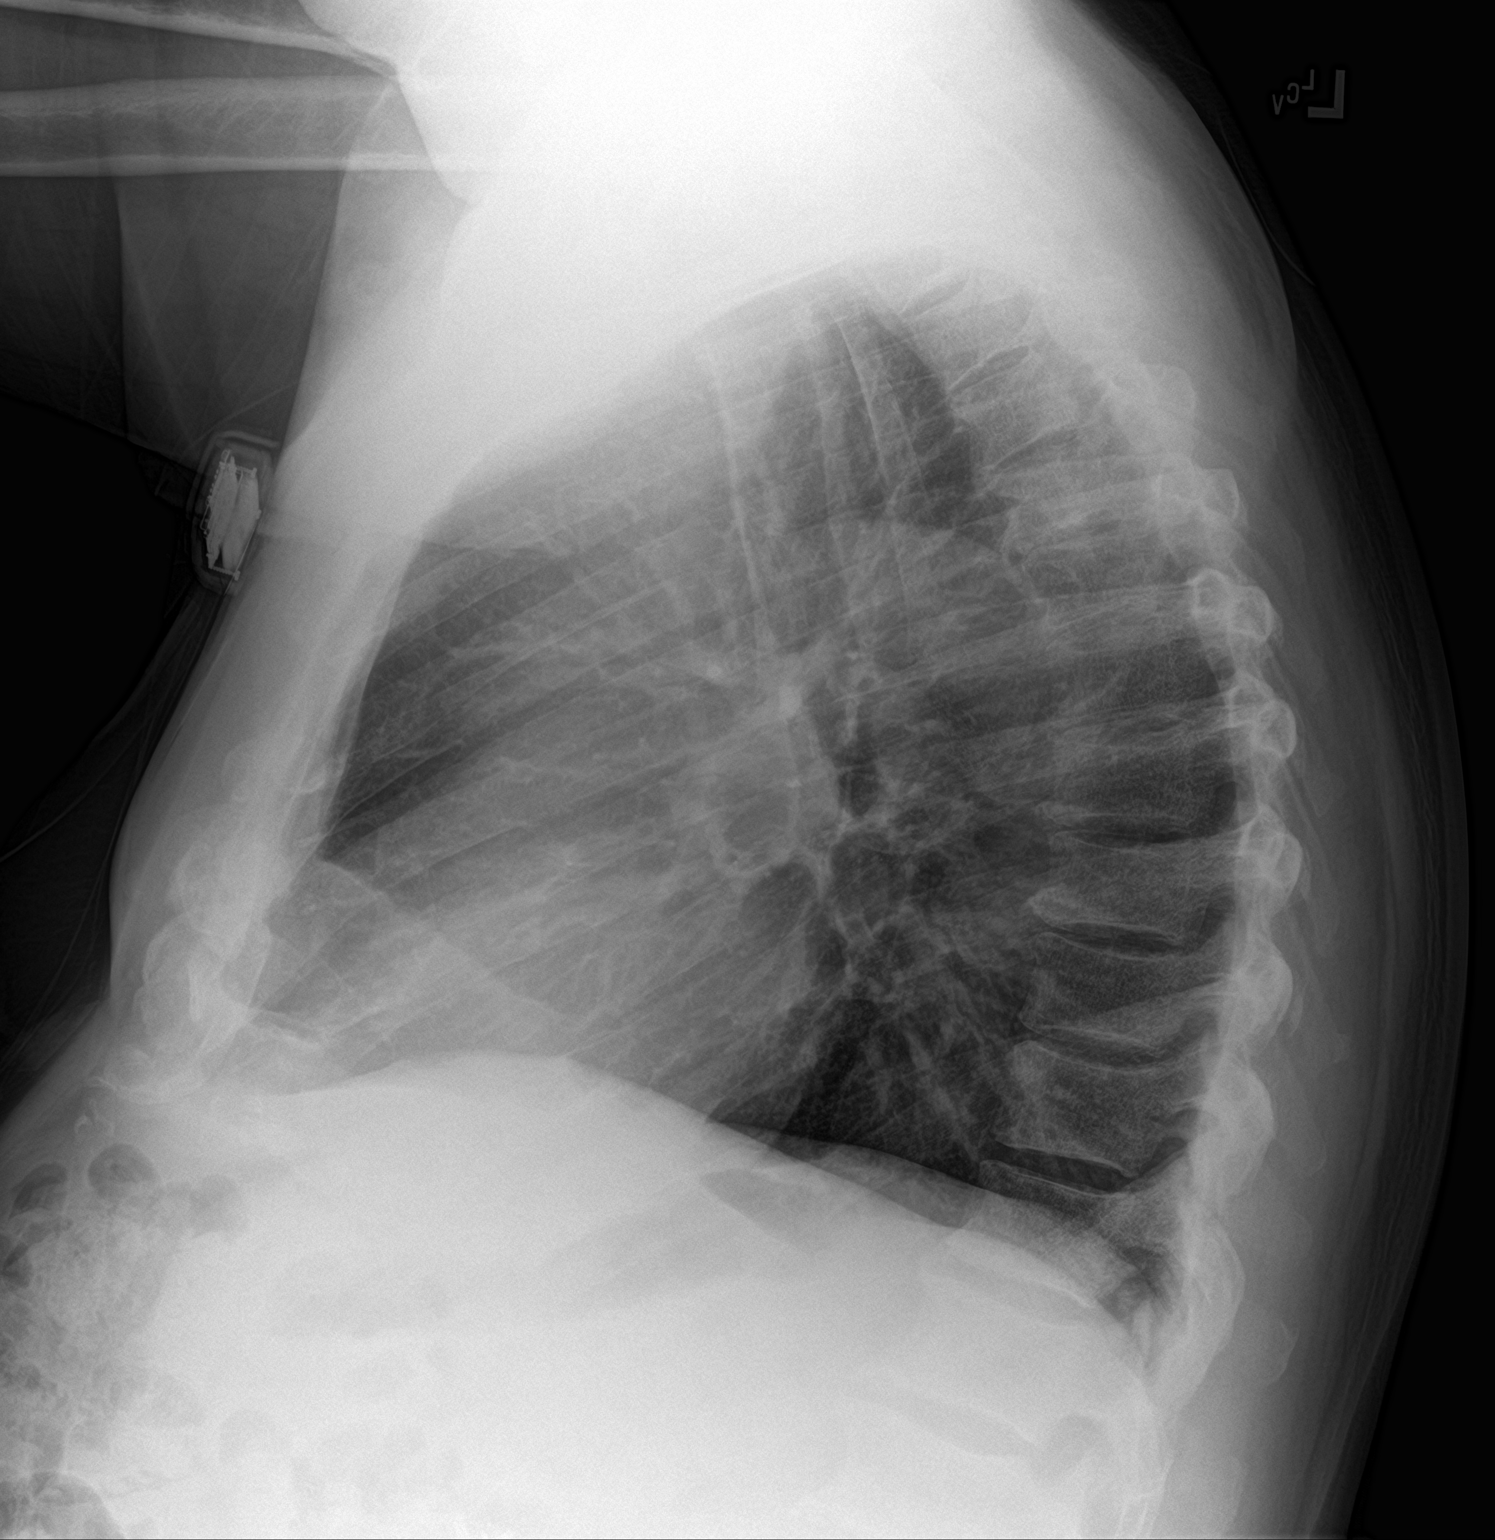

[2 of 2 positions shown; findings below may reference images not displayed]

FINDINGS: The lungs are clear without focal pneumonia, edema, pneumothorax or
pleural effusion. The cardiopericardial silhouette is within normal
limits for size. The visualized bony structures of the thorax are
intact. Telemetry leads overlie the chest.
IMPRESSION: No active cardiopulmonary disease.

## 2021-01-20 ENCOUNTER — Other Ambulatory Visit: Payer: Self-pay | Admitting: Urology

## 2021-09-03 ENCOUNTER — Other Ambulatory Visit: Payer: Self-pay | Admitting: Urology

## 2021-10-13 ENCOUNTER — Ambulatory Visit: Payer: Medicare Other | Admitting: Dermatology

## 2021-10-13 DIAGNOSIS — D692 Other nonthrombocytopenic purpura: Secondary | ICD-10-CM

## 2021-10-13 DIAGNOSIS — Q828 Other specified congenital malformations of skin: Secondary | ICD-10-CM

## 2021-10-13 DIAGNOSIS — D485 Neoplasm of uncertain behavior of skin: Secondary | ICD-10-CM

## 2021-10-13 DIAGNOSIS — C4492 Squamous cell carcinoma of skin, unspecified: Secondary | ICD-10-CM

## 2021-10-13 DIAGNOSIS — D0439 Carcinoma in situ of skin of other parts of face: Secondary | ICD-10-CM | POA: Diagnosis not present

## 2021-10-13 DIAGNOSIS — L578 Other skin changes due to chronic exposure to nonionizing radiation: Secondary | ICD-10-CM | POA: Diagnosis not present

## 2021-10-13 DIAGNOSIS — C4491 Basal cell carcinoma of skin, unspecified: Secondary | ICD-10-CM

## 2021-10-13 DIAGNOSIS — L821 Other seborrheic keratosis: Secondary | ICD-10-CM | POA: Diagnosis not present

## 2021-10-13 DIAGNOSIS — C44319 Basal cell carcinoma of skin of other parts of face: Secondary | ICD-10-CM | POA: Diagnosis not present

## 2021-10-13 HISTORY — DX: Basal cell carcinoma of skin, unspecified: C44.91

## 2021-10-13 HISTORY — DX: Squamous cell carcinoma of skin, unspecified: C44.92

## 2021-10-13 NOTE — Progress Notes (Signed)
New Patient Visit  Subjective  Tyler Wiley is a 76 y.o. male who presents for the following: Irregular skin lesion (On the L chin - has been present for 6-12 months, crusted, irregular, patient and patient's wife are concerned about lesion and would like it checked today. He also noticed an irregular skin lesion on his R lat thigh that has been chronic and persistent he would like it checked as well).The patient has spots, moles and lesions to be evaluated, some may be new or changing and the patient has concerns that these could be cancer.   The following portions of the chart were reviewed this encounter and updated as appropriate:   Tobacco  Allergies  Meds  Problems  Med Hx  Surg Hx  Fam Hx     Review of Systems:  No other skin or systemic complaints except as noted in HPI or Assessment and Plan.  Objective  Well appearing patient in no apparent distress; mood and affect are within normal limits.  A focused examination was performed including the face and legs. Relevant physical exam findings are noted in the Assessment and Plan.  L chin 1.8 x 1.0 hyperkeratotic plaque above the chin.      L nose Irregular textured patch 2.0 cm      R ant lat thigh Annular raised keratotic spot. 1.0 cm.       Assessment & Plan  Neoplasm of uncertain behavior of skin (2) L chin  Epidermal / dermal shaving  Lesion diameter (cm):  1.8 Informed consent: discussed and consent obtained   Timeout: patient name, date of birth, surgical site, and procedure verified   Procedure prep:  Patient was prepped and draped in usual sterile fashion Prep type:  Isopropyl alcohol Anesthesia: the lesion was anesthetized in a standard fashion   Anesthetic:  1% lidocaine w/ epinephrine 1-100,000 buffered w/ 8.4% NaHCO3 Instrument used: flexible razor blade   Hemostasis achieved with: pressure, aluminum chloride and electrodesiccation   Outcome: patient tolerated procedure well    Post-procedure details: sterile dressing applied and wound care instructions given   Dressing type: bandage and petrolatum    Destruction of lesion Complexity: extensive   Destruction method: electrodesiccation and curettage   Informed consent: discussed and consent obtained   Timeout:  patient name, date of birth, surgical site, and procedure verified Procedure prep:  Patient was prepped and draped in usual sterile fashion Prep type:  Isopropyl alcohol Anesthesia: the lesion was anesthetized in a standard fashion   Anesthetic:  1% lidocaine w/ epinephrine 1-100,000 buffered w/ 8.4% NaHCO3 Curettage performed in three different directions: Yes   Electrodesiccation performed over the curetted area: Yes   Final wound size (cm):  2.1 Hemostasis achieved with:  pressure, aluminum chloride and electrodesiccation Outcome: patient tolerated procedure well with no complications   Post-procedure details: sterile dressing applied and wound care instructions given   Dressing type: bandage and petrolatum    Specimen 1 - Surgical pathology Differential Diagnosis: D48.5 r/o SCC other  ED&C today Check Margins: No  L nose  Skin / nail biopsy Type of biopsy: tangential   Informed consent: discussed and consent obtained   Timeout: patient name, date of birth, surgical site, and procedure verified   Procedure prep:  Patient was prepped and draped in usual sterile fashion Prep type:  Isopropyl alcohol Anesthesia: the lesion was anesthetized in a standard fashion   Anesthetic:  1% lidocaine w/ epinephrine 1-100,000 buffered w/ 8.4% NaHCO3 Instrument used: flexible razor  blade   Hemostasis achieved with: pressure, aluminum chloride and electrodesiccation   Outcome: patient tolerated procedure well   Post-procedure details: sterile dressing applied and wound care instructions given   Dressing type: bandage and petrolatum    Specimen 2 - Surgical pathology Differential Diagnosis: D48.5 r/o AK vs  other Check Margins: No  Discussed Mohs micrographic surgery vs shave removal and ED&C to the L chin. Patient and wife prefers to treat today with a shave removal and ED&C today.  Porokeratosis R ant lat thigh  Benign-appearing.  Observation.  Call clinic for new or changing lesions.  Recommend daily use of broad spectrum spf 30+ sunscreen to sun-exposed areas.    Actinic Damage - chronic, secondary to cumulative UV radiation exposure/sun exposure over time - diffuse scaly erythematous macules with underlying dyspigmentation - Recommend daily broad spectrum sunscreen SPF 30+ to sun-exposed areas, reapply every 2 hours as needed.  - Recommend staying in the shade or wearing long sleeves, sun glasses (UVA+UVB protection) and wide brim hats (4-inch brim around the entire circumference of the hat). - Call for new or changing lesions.  Seborrheic Keratoses - Stuck-on, waxy, tan-brown papules and/or plaques  - Benign-appearing - Discussed benign etiology and prognosis. - Observe - Call for any changes  Purpura - Chronic; persistent and recurrent.  Treatable, but not curable. - Violaceous macules and patches - Benign - Related to trauma, age, sun damage and/or use of blood thinners, chronic use of topical and/or oral steroids - Observe - Can use OTC arnica containing moisturizer such as Dermend Bruise Formula if desired - Call for worsening or other concerns  Return in about 4 months (around 02/13/2022).  Luther Redo, CMA, am acting as scribe for Sarina Ser, MD . Documentation: I have reviewed the above documentation for accuracy and completeness, and I agree with the above.  Sarina Ser, MD

## 2021-10-13 NOTE — Patient Instructions (Addendum)
Wound Care Instructions  Cleanse wound gently with soap and water once a day then pat dry with clean gauze. Apply a thing coat of Petrolatum (petroleum jelly, "Vaseline") over the wound (unless you have an allergy to this). We recommend that you use a new, sterile tube of Vaseline. Do not pick or remove scabs. Do not remove the yellow or white "healing tissue" from the base of the wound.  Cover the wound with fresh, clean, nonstick gauze and secure with paper tape. You may use Band-Aids in place of gauze and tape if the would is small enough, but would recommend trimming much of the tape off as there is often too much. Sometimes Band-Aids can irritate the skin.  You should call the office for your biopsy report after 1 week if you have not already been contacted.  If you experience any problems, such as abnormal amounts of bleeding, swelling, significant bruising, significant pain, or evidence of infection, please call the office immediately.  FOR ADULT SURGERY PATIENTS: If you need something for pain relief you may take 1 extra strength Tylenol (acetaminophen) AND 2 Ibuprofen (200mg each) together every 4 hours as needed for pain. (do not take these if you are allergic to them or if you have a reason you should not take them.) Typically, you may only need pain medication for 1 to 3 days.    Due to recent changes in healthcare laws, you may see results of your pathology and/or laboratory studies on MyChart before the doctors have had a chance to review them. We understand that in some cases there may be results that are confusing or concerning to you. Please understand that not all results are received at the same time and often the doctors may need to interpret multiple results in order to provide you with the best plan of care or course of treatment. Therefore, we ask that you please give us 2 business days to thoroughly review all your results before contacting the office for clarification. Should we  see a critical lab result, you will be contacted sooner.   If You Need Anything After Your Visit  If you have any questions or concerns for your doctor, please call our main line at [phone] and press option 4 to reach your doctor's medical assistant. If no one answers, please leave a voicemail as directed and we will return your call as soon as possible. Messages left after 4 pm will be answered the following business day.   You may also send us a message via MyChart. We typically respond to MyChart messages within 1-2 business days.  For prescription refills, please ask your pharmacy to contact our office. Our fax number is [phone].  If you have an urgent issue when the clinic is closed that cannot wait until the next business day, you can page your doctor at the number below.    Please note that while we do our best to be available for urgent issues outside of office hours, we are not available 24/7.   If you have an urgent issue and are unable to reach us, you may choose to seek medical care at your doctor's office, retail clinic, urgent care center, or emergency room.  If you have a medical emergency, please immediately call 911 or go to the emergency department.  Pager Numbers  - Dr. Kowalski: [phone]  - Dr. Moye: [phone]  - Dr. Stewart: [phone]  In the event of inclement weather, please call our main line at [phone]   for an update on the status of any delays or closures.  Dermatology Medication Tips: Please keep the boxes that topical medications come in in order to help keep track of the instructions about where and how to use these. Pharmacies typically print the medication instructions only on the boxes and not directly on the medication tubes.   If your medication is too expensive, please contact our office at [phone] option 4 or send us a message through MyChart.   We are unable to tell what your co-pay for medications will be in advance  as this is different depending on your insurance coverage. However, we may be able to find a substitute medication at lower cost or fill out paperwork to get insurance to cover a needed medication.   If a prior authorization is required to get your medication covered by your insurance company, please allow us 1-2 business days to complete this process.  Drug prices often vary depending on where the prescription is filled and some pharmacies may offer cheaper prices.  The website www.goodrx.com contains coupons for medications through different pharmacies. The prices here do not account for what the cost may be with help from insurance (it may be cheaper with your insurance), but the website can give you the price if you did not use any insurance.  - You can print the associated coupon and take it with your prescription to the pharmacy.  - You may also stop by our office during regular business hours and pick up a GoodRx coupon card.  - If you need your prescription sent electronically to a different pharmacy, notify our office through [facility] MyChart or by phone at [phone] option 4.     Si Usted Necesita Algo Despus de Su Visita  Tambin puede enviarnos un mensaje a travs de MyChart. Por lo general respondemos a los mensajes de MyChart en el transcurso de 1 a 2 das hbiles.  Para renovar recetas, por favor pida a su farmacia que se ponga en contacto con nuestra oficina. Nuestro nmero de fax es el [phone].  Si tiene un asunto urgente cuando la clnica est cerrada y que no puede esperar hasta el siguiente da hbil, puede llamar/localizar a su doctor(a) al nmero que aparece a continuacin.   Por favor, tenga en cuenta que aunque hacemos todo lo posible para estar disponibles para asuntos urgentes fuera del horario de oficina, no estamos disponibles las 24 horas del da, los 7 das de la semana.   Si tiene un problema urgente y no puede comunicarse con nosotros, puede optar  por buscar atencin mdica  en el consultorio de su doctor(a), en una clnica privada, en un centro de atencin urgente o en una sala de emergencias.  Si tiene una emergencia mdica, por favor llame inmediatamente al 911 o vaya a la sala de emergencias.  Nmeros de bper  - Dr. Kowalski: [phone]  - Dra. Moye: [phone]  - Dra. Stewart: [phone]  En caso de inclemencias del tiempo, por favor llame a nuestra lnea principal al [phone] para una actualizacin sobre el estado de cualquier retraso o cierre.  Consejos para la medicacin en dermatologa: Por favor, guarde las cajas en las que vienen los medicamentos de uso tpico para ayudarle a seguir las instrucciones sobre dnde y cmo usarlos. Las farmacias generalmente imprimen las instrucciones del medicamento slo en las cajas y no directamente en los tubos del medicamento.   Si su medicamento es muy caro, por favor, pngase en contacto con nuestra   oficina llamando al [phone] y presione la opcin 4 o envenos un mensaje a travs de MyChart.   No podemos decirle cul ser su copago por los medicamentos por adelantado ya que esto es diferente dependiendo de la cobertura de su seguro. Sin embargo, es posible que podamos encontrar un medicamento sustituto a menor costo o llenar un formulario para que el seguro cubra el medicamento que se considera necesario.   Si se requiere una autorizacin previa para que su compaa de seguros cubra su medicamento, por favor permtanos de 1 a 2 das hbiles para completar este proceso.  Los precios de los medicamentos varan con frecuencia dependiendo del lugar de dnde se surte la receta y alguna farmacias pueden ofrecer precios ms baratos.  El sitio web www.goodrx.com tiene cupones para medicamentos de diferentes farmacias. Los precios aqu no tienen en cuenta lo que podra costar con la ayuda del seguro (puede ser ms barato con su seguro), pero el sitio web puede darle el precio si  no utiliz ningn seguro.  - Puede imprimir el cupn correspondiente y llevarlo con su receta a la farmacia.  - Tambin puede pasar por nuestra oficina durante el horario de atencin regular y recoger una tarjeta de cupones de GoodRx.  - Si necesita que su receta se enve electrnicamente a una farmacia diferente, informe a nuestra oficina a travs de MyChart de [facility] o por telfono llamando al [phone] y presione la opcin 4.  

## 2021-10-20 ENCOUNTER — Telehealth: Payer: Self-pay

## 2021-10-20 NOTE — Telephone Encounter (Signed)
-----   Message from Ralene Bathe, MD sent at 10/16/2021  6:08 PM EDT ----- Diagnosis 1. Skin , left chin BASAL CELL CARCINOMA, NODULAR AND INFILTRATIVE PATTERNS, BASE INVOLVED 2. 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

## 2021-10-20 NOTE — Telephone Encounter (Signed)
Patient informed of pathology results and appointment scheduled.  °

## 2021-10-21 ENCOUNTER — Encounter: Payer: Self-pay | Admitting: Dermatology

## 2021-12-03 ENCOUNTER — Ambulatory Visit (INDEPENDENT_AMBULATORY_CARE_PROVIDER_SITE_OTHER): Payer: Medicare Other | Admitting: Dermatology

## 2021-12-03 ENCOUNTER — Encounter: Payer: Self-pay | Admitting: Dermatology

## 2021-12-03 DIAGNOSIS — Z5111 Encounter for antineoplastic chemotherapy: Secondary | ICD-10-CM | POA: Diagnosis not present

## 2021-12-03 DIAGNOSIS — Z85828 Personal history of other malignant neoplasm of skin: Secondary | ICD-10-CM | POA: Diagnosis not present

## 2021-12-03 DIAGNOSIS — D099 Carcinoma in situ, unspecified: Secondary | ICD-10-CM

## 2021-12-03 DIAGNOSIS — L578 Other skin changes due to chronic exposure to nonionizing radiation: Secondary | ICD-10-CM

## 2021-12-03 DIAGNOSIS — D0439 Carcinoma in situ of skin of other parts of face: Secondary | ICD-10-CM | POA: Diagnosis not present

## 2021-12-03 MED ORDER — FLUOROURACIL 5 % EX CREA: TOPICAL_CREAM | Freq: Two times a day (BID) | CUTANEOUS | 0 refills | Status: DC

## 2021-12-03 NOTE — Patient Instructions (Addendum)
Instructions for [facility] Medications  One or more of your medications was sent to the [facility] mail order compounding pharmacy. You will receive an email from them and can purchase the medicine through that link. It will then be mailed to your home at the address you confirmed. If for any reason you do not receive an email from them, please check your spam folder. If you still do not find the email, please let us know. [facility] phone number is [phone].      5-Fluorouracil/Calcipotriene Patient Education   Actinic keratoses are the dry, red scaly spots on the skin caused by sun damage. A portion of these spots can turn into skin cancer with time, and treating them can help prevent development of skin cancer.   Treatment of these spots requires removal of the defective skin cells. There are various ways to remove actinic keratoses, including freezing with liquid nitrogen, treatment with creams, or treatment with a blue light procedure in the office.   5-fluorouracil cream is a topical cream used to treat actinic keratoses. It works by interfering with the growth of abnormal fast-growing skin cells, such as actinic keratoses. These cells peel off and are replaced by healthy ones.   5-fluorouracil/calcipotriene is a combination of the 5-fluorouracil cream with a vitamin D analog cream called calcipotriene. The calcipotriene alone does not treat actinic keratoses. However, when it is combined with 5-fluorouracil, it helps the 5-fluorouracil treat the actinic keratoses much faster so that the same results can be achieved with a much shorter treatment time.  INSTRUCTIONS FOR 5-FLUOROURACIL/CALCIPOTRIENE CREAM:   5-fluorouracil/calcipotriene cream typically only needs to be used for 4-7 days. A thin layer should be applied twice a day to the treatment areas recommended by your physician.   If your physician prescribed you separate tubes of 5-fluourouracil and calcipotriene,  apply a thin layer of 5-fluorouracil followed by a thin layer of calcipotriene.   Avoid contact with your eyes, nostrils, and mouth. Do not use 5-fluorouracil/calcipotriene cream on infected or open wounds.   You will develop redness, irritation and some crusting at areas where you have pre-cancer damage/actinic keratoses. IF YOU DEVELOP PAIN, BLEEDING, OR SIGNIFICANT CRUSTING, STOP THE TREATMENT EARLY - you have already gotten a good response and the actinic keratoses should clear up well.  Wash your hands after applying 5-fluorouracil 5% cream on your skin.   A moisturizer or sunscreen with a minimum SPF 30 should be applied each morning.   Once you have finished the treatment, you can apply a thin layer of Vaseline twice a day to irritated areas to soothe and calm the areas more quickly. If you experience significant discomfort, contact your physician.  For some patients it is necessary to repeat the treatment for best results.  SIDE EFFECTS: When using 5-fluorouracil/calcipotriene cream, you may have mild irritation, such as redness, dryness, swelling, or a mild burning sensation. This usually resolves within 2 weeks. The more actinic keratoses you have, the more redness and inflammation you can expect during treatment. Eye irritation has been reported rarely. If this occurs, please let us know.  If you have any trouble using this cream, please call the office. If you have any other questions about this information, please do not hesitate to ask me before you leave the office.     Due to recent changes in healthcare laws, you may see results of your pathology and/or laboratory studies on MyChart before the doctors have had a chance to review them. We   cases there may be results that are confusing or concerning to you. Please understand that not all results are received at the same time and often the doctors may need to interpret multiple results in order to provide you with the  best plan of care or course of treatment. Therefore, we ask that you please give [place] 2 business days to thoroughly review all your results before contacting the office for clarification. Should we see a critical lab result, you will be contacted sooner.   If You Need Anything After Your Visit  If you have any questions or concerns for your doctor, please call our main line at [phone] and press option 4 to reach your doctor's medical assistant. If no one answers, please leave a voicemail as directed and we will return your call as soon as possible. Messages left after 4 pm will be answered the following business day.   You may also send [place] a message via [facility]. We typically respond to MyChart messages within 1-2 business days.  For prescription refills, please ask your pharmacy to contact our office. Our fax number is [phone].  If you have an urgent issue when the clinic is closed that cannot wait until the next business day, you can page your doctor at the number below.    Please note that while we do our best to be available for urgent issues outside of office hours, we are not available 24/7.   If you have an urgent issue and are unable to reach [place], you may choose to seek medical care at your doctor's office, retail clinic, urgent care center, or emergency room.  If you have a medical emergency, please immediately call 911 or go to the emergency department.  Pager Numbers  - Dr. Nehemiah Massed: [phone]  - Dr. Laurence Ferrari: [phone]  - Dr. Nicole Kindred: [phone]  In the event of inclement weather, please call our main line at [phone] for an update on the status of any delays or closures.  Dermatology Medication Tips: Please keep the boxes that topical medications come in in order to help keep track of the instructions about where and how to use these. Pharmacies typically print the medication instructions only on the boxes and not directly on the medication tubes.   If your  medication is too expensive, please contact our office at [phone] option 4 or send [place] a message through [facility].   We are unable to tell what your co-pay for medications will be in advance as this is different depending on your insurance coverage. However, we may be able to find a substitute medication at lower cost or fill out paperwork to get insurance to cover a needed medication.   If a prior authorization is required to get your medication covered by your insurance company, please allow [place] 1-2 business days to complete this process.  Drug prices often vary depending on where the prescription is filled and some pharmacies may offer cheaper prices.  The website www.goodrx.com contains coupons for medications through different pharmacies. The prices here do not account for what the cost may be with help from insurance (it may be cheaper with your insurance), but the website can give you the price if you did not use any insurance.  - You can print the associated coupon and take it with your prescription to the pharmacy.  - You may also stop by our office during regular business hours and pick up a GoodRx coupon card.  - If you need your prescription sent  need your prescription sent electronically to a different pharmacy, notify our office through [facility] MyChart or by phone at [phone] option 4.     Si Usted Necesita Algo Despus de Su Visita  Tambin puede enviarnos un mensaje a travs de MyChart. Por lo general respondemos a los mensajes de MyChart en el transcurso de 1 a 2 das hbiles.  Para renovar recetas, por favor pida a su farmacia que se ponga en contacto con nuestra oficina. Nuestro nmero de fax es el [phone].  Si tiene un asunto urgente cuando la clnica est cerrada y que no puede esperar hasta el siguiente da hbil, puede llamar/localizar a su doctor(a) al nmero que aparece a continuacin.   Por favor, tenga en cuenta que aunque hacemos todo lo posible para estar  disponibles para asuntos urgentes fuera del horario de oficina, no estamos disponibles las 24 horas del da, los 7 das de la semana.   Si tiene un problema urgente y no puede comunicarse con nosotros, puede optar por buscar atencin mdica  en el consultorio de su doctor(a), en una clnica privada, en un centro de atencin urgente o en una sala de emergencias.  Si tiene una emergencia mdica, por favor llame inmediatamente al 911 o vaya a la sala de emergencias.  Nmeros de bper  - Dr. Kowalski: [phone]  - Dra. Moye: [phone]  - Dra. Stewart: [phone]  En caso de inclemencias del tiempo, por favor llame a nuestra lnea principal al [phone] para una actualizacin sobre el estado de cualquier retraso o cierre.  Consejos para la medicacin en dermatologa: Por favor, guarde las cajas en las que vienen los medicamentos de uso tpico para ayudarle a seguir las instrucciones sobre dnde y cmo usarlos. Las farmacias generalmente imprimen las instrucciones del medicamento slo en las cajas y no directamente en los tubos del medicamento.   Si su medicamento es muy caro, por favor, pngase en contacto con nuestra oficina llamando al [phone] y presione la opcin 4 o envenos un mensaje a travs de MyChart.   No podemos decirle cul ser su copago por los medicamentos por adelantado ya que esto es diferente dependiendo de la cobertura de su seguro. Sin embargo, es posible que podamos encontrar un medicamento sustituto a menor costo o llenar un formulario para que el seguro cubra el medicamento que se considera necesario.   Si se requiere una autorizacin previa para que su compaa de seguros cubra su medicamento, por favor permtanos de 1 a 2 das hbiles para completar este proceso.  Los precios de los medicamentos varan con frecuencia dependiendo del lugar de dnde se surte la receta y alguna farmacias pueden ofrecer precios ms baratos.  El sitio web www.goodrx.com tiene  cupones para medicamentos de diferentes farmacias. Los precios aqu no tienen en cuenta lo que podra costar con la ayuda del seguro (puede ser ms barato con su seguro), pero el sitio web puede darle el precio si no utiliz ningn seguro.  - Puede imprimir el cupn correspondiente y llevarlo con su receta a la farmacia.  - Tambin puede pasar por nuestra oficina durante el horario de atencin regular y recoger una tarjeta de cupones de GoodRx.  - Si necesita que su receta se enve electrnicamente a una farmacia diferente, informe a nuestra oficina a travs de MyChart de [facility] o por telfono llamando al [phone] y presione la opcin 4.  

## 2021-12-03 NOTE — Progress Notes (Signed)
   Follow-Up Visit   Subjective  Tyler Wiley is a 76 y.o. male who presents for the following: SCCIS (L nose, bx proven - patient here today to discuss treatment options and to treat today).  The following portions of the chart were reviewed this encounter and updated as appropriate:   Tobacco  Allergies  Meds  Problems  Med Hx  Surg Hx  Fam Hx     Review of Systems:  No other skin or systemic complaints except as noted in HPI or Assessment and Plan.  Objective  Well appearing patient in no apparent distress; mood and affect are within normal limits.  A focused examination was performed including the face. Relevant physical exam findings are noted in the Assessment and Plan.  L nose Healing biopsy site 2.1 cm.    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  Destruction of lesion Complexity: simple   Destruction method: cryotherapy   Informed consent: discussed and consent obtained   Timeout:  patient name, date of birth, surgical site, and procedure verified Lesion destroyed using liquid nitrogen: Yes   Region frozen until ice ball extended beyond lesion: Yes   Lesion length (cm):  2.1 Lesion width (cm):  2.1 Margin per side (cm):  0.2 Final wound size (cm):  2.5 Outcome: patient tolerated procedure well with no complications   Post-procedure details: wound care instructions given    fluorouracil (EFUDEX) 5 % cream Apply topically 2 (two) times daily. Starting October the 1st apply to the L nose BID x 7 days.  Discussed tx options of LN2 vs ED&C -   Will treat today with LN2. Then in one month start 5FU/Calcipotriene cream BID x 1 week. Will likely repeat topical treatment 2-3 times. Recheck at next visit.  Actinic Damage - chronic, secondary to cumulative UV radiation exposure/sun exposure over time - diffuse scaly erythematous macules with underlying dyspigmentation - Recommend daily broad spectrum sunscreen SPF 30+ to sun-exposed areas,  reapply every 2 hours as needed.  - Recommend staying in the shade or wearing long sleeves, sun glasses (UVA+UVB protection) and wide brim hats (4-inch brim around the entire circumference of the hat). - Call for new or changing lesions.  History of Basal Cell Carcinoma of the Skin - No evidence of recurrence today - Recommend regular full body skin exams - Recommend daily broad spectrum sunscreen SPF 30+ to sun-exposed areas, reapply every 2 hours as needed.  - Call if any new or changing lesions are noted between office visits  Return for appointment as scheduled.  Luther Redo, CMA, am acting as scribe for Sarina Ser, MD . Documentation: I have reviewed the above documentation for accuracy and completeness, and I agree with the above.  Sarina Ser, MD

## 2022-02-19 ENCOUNTER — Ambulatory Visit (INDEPENDENT_AMBULATORY_CARE_PROVIDER_SITE_OTHER): Payer: Medicare Other | Admitting: Dermatology

## 2022-02-19 DIAGNOSIS — L578 Other skin changes due to chronic exposure to nonionizing radiation: Secondary | ICD-10-CM

## 2022-02-19 DIAGNOSIS — Z79899 Other long term (current) drug therapy: Secondary | ICD-10-CM | POA: Diagnosis not present

## 2022-02-19 DIAGNOSIS — D099 Carcinoma in situ, unspecified: Secondary | ICD-10-CM

## 2022-02-19 DIAGNOSIS — Z5111 Encounter for antineoplastic chemotherapy: Secondary | ICD-10-CM | POA: Diagnosis not present

## 2022-02-19 DIAGNOSIS — D0439 Carcinoma in situ of skin of other parts of face: Secondary | ICD-10-CM | POA: Diagnosis not present

## 2022-02-19 NOTE — Patient Instructions (Signed)
Due to recent changes in healthcare laws, you may see results of your pathology and/or laboratory studies on MyChart before the doctors have had a chance to review them. We understand that in some cases there may be results that are confusing or concerning to you. Please understand that not all results are received at the same time and often the doctors may need to interpret multiple results in order to provide you with the best plan of care or course of treatment. Therefore, we ask that you please give us 2 business days to thoroughly review all your results before contacting the office for clarification. Should we see a critical lab result, you will be contacted sooner.   If You Need Anything After Your Visit  If you have any questions or concerns for your doctor, please call our main line at [phone] and press option 4 to reach your doctor's medical assistant. If no one answers, please leave a voicemail as directed and we will return your call as soon as possible. Messages left after 4 pm will be answered the following business day.   You may also send us a message via MyChart. We typically respond to MyChart messages within 1-2 business days.  For prescription refills, please ask your pharmacy to contact our office. Our fax number is [phone].  If you have an urgent issue when the clinic is closed that cannot wait until the next business day, you can page your doctor at the number below.    Please note that while we do our best to be available for urgent issues outside of office hours, we are not available 24/7.   If you have an urgent issue and are unable to reach us, you may choose to seek medical care at your doctor's office, retail clinic, urgent care center, or emergency room.  If you have a medical emergency, please immediately call 911 or go to the emergency department.  Pager Numbers  - Dr. Kowalski: [phone]  - Dr. Moye: [phone]  - Dr. Stewart:  [phone]  In the event of inclement weather, please call our main line at [phone] for an update on the status of any delays or closures.  Dermatology Medication Tips: Please keep the boxes that topical medications come in in order to help keep track of the instructions about where and how to use these. Pharmacies typically print the medication instructions only on the boxes and not directly on the medication tubes.   If your medication is too expensive, please contact our office at [phone] option 4 or send us a message through MyChart.   We are unable to tell what your co-pay for medications will be in advance as this is different depending on your insurance coverage. However, we may be able to find a substitute medication at lower cost or fill out paperwork to get insurance to cover a needed medication.   If a prior authorization is required to get your medication covered by your insurance company, please allow us 1-2 business days to complete this process.  Drug prices often vary depending on where the prescription is filled and some pharmacies may offer cheaper prices.  The website www.goodrx.com contains coupons for medications through different pharmacies. The prices here do not account for what the cost may be with help from insurance (it may be cheaper with your insurance), but the website can give you the price if you did not use any insurance.  - You can print the associated coupon and take it with   your prescription to the pharmacy.  - You may also stop by our office during regular business hours and pick up a GoodRx coupon card.  - If you need your prescription sent electronically to a different pharmacy, notify our office through [place] MyChart or by phone at [phone] option 4.     Si Usted Necesita Algo Despus de Su Visita  Tambin puede enviarnos un mensaje a travs de MyChart. Por lo general respondemos a los mensajes de MyChart en el transcurso de 1 a 2  das hbiles.  Para renovar recetas, por favor pida a su farmacia que se ponga en contacto con nuestra oficina. Nuestro nmero de fax es el [phone].  Si tiene un asunto urgente cuando la clnica est cerrada y que no puede esperar hasta el siguiente da hbil, puede llamar/localizar a su doctor(a) al nmero que aparece a continuacin.   Por favor, tenga en cuenta que aunque hacemos todo lo posible para estar disponibles para asuntos urgentes fuera del horario de oficina, no estamos disponibles las 24 horas del da, los 7 das de la semana.   Si tiene un problema urgente y no puede comunicarse con nosotros, puede optar por buscar atencin mdica  en el consultorio de su doctor(a), en una clnica privada, en un centro de atencin urgente o en una sala de emergencias.  Si tiene una emergencia mdica, por favor llame inmediatamente al 911 o vaya a la sala de emergencias.  Nmeros de bper  - Dr. Kowalski: [phone]  - Dra. Moye: [phone]  - Dra. Stewart: [phone]  En caso de inclemencias del tiempo, por favor llame a nuestra lnea principal al [phone] para una actualizacin sobre el estado de cualquier retraso o cierre.  Consejos para la medicacin en dermatologa: Por favor, guarde las cajas en las que vienen los medicamentos de uso tpico para ayudarle a seguir las instrucciones sobre dnde y cmo usarlos. Las farmacias generalmente imprimen las instrucciones del medicamento slo en las cajas y no directamente en los tubos del medicamento.   Si su medicamento es muy caro, por favor, pngase en contacto con nuestra oficina llamando al [phone] y presione la opcin 4 o envenos un mensaje a travs de MyChart.   No podemos decirle cul ser su copago por los medicamentos por adelantado ya que esto es diferente dependiendo de la cobertura de su seguro. Sin embargo, es posible que podamos encontrar un medicamento sustituto a menor costo o llenar un formulario para que el  seguro cubra el medicamento que se considera necesario.   Si se requiere una autorizacin previa para que su compaa de seguros cubra su medicamento, por favor permtanos de 1 a 2 das hbiles para completar este proceso.  Los precios de los medicamentos varan con frecuencia dependiendo del lugar de dnde se surte la receta y alguna farmacias pueden ofrecer precios ms baratos.  El sitio web www.goodrx.com tiene cupones para medicamentos de diferentes farmacias. Los precios aqu no tienen en cuenta lo que podra costar con la ayuda del seguro (puede ser ms barato con su seguro), pero el sitio web puede darle el precio si no utiliz ningn seguro.  - Puede imprimir el cupn correspondiente y llevarlo con su receta a la farmacia.  - Tambin puede pasar por nuestra oficina durante el horario de atencin regular y recoger una tarjeta de cupones de GoodRx.  - Si necesita que su receta se enve electrnicamente a una farmacia diferente, informe a nuestra oficina a travs de MyChart de    o por telfono llamando al [phone] y presione la opcin 4.  

## 2022-02-19 NOTE — Progress Notes (Signed)
   Follow-Up Visit   Subjective  Tyler Wiley is a 76 y.o. 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 The patient has spots, moles and lesions to be evaluated, some may be new or changing and the patient has concerns that these could be cancer.  The following portions of the chart were reviewed this encounter and updated as appropriate:   Tobacco  Allergies  Meds  Problems  Med Hx  Surg Hx  Fam Hx     Review of Systems:  No other skin or systemic complaints except as noted in HPI or Assessment and Plan.  Objective  Well appearing patient in no apparent distress; mood and affect are within normal limits.  A focused examination was performed including face. Relevant physical exam findings are noted in the Assessment and Plan.  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 Recommend restarting fluorouracil 5%/Calcipotriene cream bid x 7 days - advised patient he may start after Thanksgiving - Recheck on follow up  Related Medications fluorouracil (EFUDEX) 5 % cream Apply topically 2 (two) times daily. Starting October the 1st apply to the L nose BID x 7 days.  Actinic Damage - chronic, secondary to cumulative UV radiation exposure/sun exposure over time - diffuse scaly erythematous macules with underlying dyspigmentation - Recommend daily broad spectrum sunscreen SPF 30+ to sun-exposed areas, reapply every 2 hours as needed.  - Recommend staying in the shade or wearing long sleeves, sun glasses (UVA+UVB protection) and wide brim hats (4-inch brim around the entire circumference of the hat). - Call for new or changing lesions.  Return for 6-8 months , TBSE.  I, Ashok Cordia, CMA, am acting as scribe for Sarina Ser, MD . Documentation: I have reviewed the above documentation for accuracy and completeness, and I  agree with the above.  Sarina Ser, MD

## 2022-03-10 ENCOUNTER — Encounter: Payer: Self-pay | Admitting: Dermatology

## 2022-05-30 ENCOUNTER — Other Ambulatory Visit: Payer: Self-pay | Admitting: Urology

## 2022-09-17 ENCOUNTER — Encounter: Payer: Self-pay | Admitting: Dermatology

## 2022-09-17 ENCOUNTER — Ambulatory Visit: Payer: Medicare Other | Admitting: Dermatology

## 2022-09-17 VITALS — BP 160/84

## 2022-09-17 DIAGNOSIS — Z8589 Personal history of malignant neoplasm of other organs and systems: Secondary | ICD-10-CM

## 2022-09-17 DIAGNOSIS — Z85828 Personal history of other malignant neoplasm of skin: Secondary | ICD-10-CM

## 2022-09-17 DIAGNOSIS — X32XXXA Exposure to sunlight, initial encounter: Secondary | ICD-10-CM

## 2022-09-17 DIAGNOSIS — D692 Other nonthrombocytopenic purpura: Secondary | ICD-10-CM | POA: Diagnosis not present

## 2022-09-17 DIAGNOSIS — W908XXA Exposure to other nonionizing radiation, initial encounter: Secondary | ICD-10-CM | POA: Diagnosis not present

## 2022-09-17 DIAGNOSIS — L578 Other skin changes due to chronic exposure to nonionizing radiation: Secondary | ICD-10-CM | POA: Diagnosis not present

## 2022-09-17 DIAGNOSIS — Z1283 Encounter for screening for malignant neoplasm of skin: Secondary | ICD-10-CM

## 2022-09-17 NOTE — Progress Notes (Signed)
   Follow-Up Visit   Subjective  Tyler Wiley is a 77 y.o. male who presents for the following: recheck SCC L nose LN2, 5FU/Calcipotriene x 2 rounds, hx of BCC L chin, pt declines TBSE today The patient presents for Total-Body Skin Exam (TBSE) for skin cancer screening and mole check.  The patient has spots, moles and lesions to be evaluated, some may be new or changing and the patient has concerns that these could be cancer..  The following portions of the chart were reviewed this encounter and updated as appropriate: medications, allergies, medical history  Review of Systems:  No other skin or systemic complaints except as noted in HPI or Assessment and Plan.  Objective  Well appearing patient in no apparent distress; mood and affect are within normal limits. A focused examination was performed of the following areas: face Relevant exam findings are noted in the Assessment and Plan.   Assessment & Plan   HISTORY OF BASAL CELL CARCINOMA OF THE SKIN - No evidence of recurrence today - Recommend regular full body skin exams - Recommend daily broad spectrum sunscreen SPF 30+ to sun-exposed areas, reapply every 2 hours as needed.  - Call if any new or changing lesions are noted between office visits  - L chin  HISTORY OF SQUAMOUS CELL CARCINOMA IN SITU OF THE SKIN - No evidence of recurrence today - Recommend regular full body skin exams - Recommend daily broad spectrum sunscreen SPF 30+ to sun-exposed areas, reapply every 2 hours as needed.  - Call if any new or changing lesions are noted between office visits  - L nose   ACTINIC DAMAGE - chronic, secondary to cumulative UV radiation exposure/sun exposure over time - diffuse scaly erythematous macules with underlying dyspigmentation - Recommend daily broad spectrum sunscreen SPF 30+ to sun-exposed areas, reapply every 2 hours as needed.  - Recommend staying in the shade or wearing long sleeves, sun glasses (UVA+UVB protection)  and wide brim hats (4-inch brim around the entire circumference of the hat). - Call for new or changing lesions.   Purpura - Chronic; persistent and recurrent.  Treatable, but not curable. - Violaceous macules and patches - Benign - Related to trauma, age, sun damage and/or use of blood thinners, chronic use of topical and/or oral steroids - Observe - Can use OTC arnica containing moisturizer such as Dermend Bruise Formula if desired - Call for worsening or other concerns  Skin cancer screening  Actinic skin damage  History of basal cell carcinoma  History of squamous cell carcinoma  Purpura (HCC)   Return in about 1 year (around 09/17/2023) for TBSE, Hx of BCC, Hx of SCC IS.  I, Sonya Hupman, RMA, am acting as scribe for Armida Sans, MD .  Documentation: I have reviewed the above documentation for accuracy and completeness, and I agree with the above.  Armida Sans, MD

## 2022-09-17 NOTE — Patient Instructions (Signed)
Can use OTC arnica containing moisturizer such as Dermend Bruise Formula if desired    Due to recent changes in healthcare laws, you may see results of your pathology and/or laboratory studies on MyChart before the doctors have had a chance to review them. We understand that in some cases there may be results that are confusing or concerning to you. Please understand that not all results are received at the same time and often the doctors may need to interpret multiple results in order to provide you with the best plan of care or course of treatment. Therefore, we ask that you please give us 2 business days to thoroughly review all your results before contacting the office for clarification. Should we see a critical lab result, you will be contacted sooner.   If You Need Anything After Your Visit  If you have any questions or concerns for your doctor, please call our main line at [phone] and press option 4 to reach your doctor's medical assistant. If no one answers, please leave a voicemail as directed and we will return your call as soon as possible. Messages left after 4 pm will be answered the following business day.   You may also send us a message via MyChart. We typically respond to MyChart messages within 1-2 business days.  For prescription refills, please ask your pharmacy to contact our office. Our fax number is [phone].  If you have an urgent issue when the clinic is closed that cannot wait until the next business day, you can page your doctor at the number below.    Please note that while we do our best to be available for urgent issues outside of office hours, we are not available 24/7.   If you have an urgent issue and are unable to reach us, you may choose to seek medical care at your doctor's office, retail clinic, urgent care center, or emergency room.  If you have a medical emergency, please immediately call 911 or go to the emergency department.  Pager Numbers  -  Dr. Kowalski: [phone]  - Dr. Moye: [phone]  - Dr. Stewart: [phone]  In the event of inclement weather, please call our main line at [phone] for an update on the status of any delays or closures.  Dermatology Medication Tips: Please keep the boxes that topical medications come in in order to help keep track of the instructions about where and how to use these. Pharmacies typically print the medication instructions only on the boxes and not directly on the medication tubes.   If your medication is too expensive, please contact our office at [phone] option 4 or send us a message through MyChart.   We are unable to tell what your co-pay for medications will be in advance as this is different depending on your insurance coverage. However, we may be able to find a substitute medication at lower cost or fill out paperwork to get insurance to cover a needed medication.   If a prior authorization is required to get your medication covered by your insurance company, please allow us 1-2 business days to complete this process.  Drug prices often vary depending on where the prescription is filled and some pharmacies may offer cheaper prices.  The website www.goodrx.com contains coupons for medications through different pharmacies. The prices here do not account for what the cost may be with help from insurance (it may be cheaper with your insurance), but the website can give you the price if you did   not use any insurance.  - You can print the associated coupon and take it with your prescription to the pharmacy.  - You may also stop by our office during regular business hours and pick up a GoodRx coupon card.  - If you need your prescription sent electronically to a different pharmacy, notify our office through [facility] MyChart or by phone at [phone] option 4.     Si Usted Necesita Algo Despus de Su Visita  Tambin puede enviarnos un mensaje a travs de MyChart. Por  lo general respondemos a los mensajes de MyChart en el transcurso de 1 a 2 das hbiles.  Para renovar recetas, por favor pida a su farmacia que se ponga en contacto con nuestra oficina. Nuestro nmero de fax es el [phone].  Si tiene un asunto urgente cuando la clnica est cerrada y que no puede esperar hasta el siguiente da hbil, puede llamar/localizar a su doctor(a) al nmero que aparece a continuacin.   Por favor, tenga en cuenta que aunque hacemos todo lo posible para estar disponibles para asuntos urgentes fuera del horario de oficina, no estamos disponibles las 24 horas del da, los 7 das de la semana.   Si tiene un problema urgente y no puede comunicarse con nosotros, puede optar por buscar atencin mdica  en el consultorio de su doctor(a), en una clnica privada, en un centro de atencin urgente o en una sala de emergencias.  Si tiene una emergencia mdica, por favor llame inmediatamente al 911 o vaya a la sala de emergencias.  Nmeros de bper  - Dr. Kowalski: [phone]  - Dra. Moye: [phone]  - Dra. Stewart: [phone]  En caso de inclemencias del tiempo, por favor llame a nuestra lnea principal al [phone] para una actualizacin sobre el estado de cualquier retraso o cierre.  Consejos para la medicacin en dermatologa: Por favor, guarde las cajas en las que vienen los medicamentos de uso tpico para ayudarle a seguir las instrucciones sobre dnde y cmo usarlos. Las farmacias generalmente imprimen las instrucciones del medicamento slo en las cajas y no directamente en los tubos del medicamento.   Si su medicamento es muy caro, por favor, pngase en contacto con nuestra oficina llamando al [phone] y presione la opcin 4 o envenos un mensaje a travs de MyChart.   No podemos decirle cul ser su copago por los medicamentos por adelantado ya que esto es diferente dependiendo de la cobertura de su seguro. Sin embargo, es posible que podamos encontrar  un medicamento sustituto a menor costo o llenar un formulario para que el seguro cubra el medicamento que se considera necesario.   Si se requiere una autorizacin previa para que su compaa de seguros cubra su medicamento, por favor permtanos de 1 a 2 das hbiles para completar este proceso.  Los precios de los medicamentos varan con frecuencia dependiendo del lugar de dnde se surte la receta y alguna farmacias pueden ofrecer precios ms baratos.  El sitio web www.goodrx.com tiene cupones para medicamentos de diferentes farmacias. Los precios aqu no tienen en cuenta lo que podra costar con la ayuda del seguro (puede ser ms barato con su seguro), pero el sitio web puede darle el precio si no utiliz ningn seguro.  - Puede imprimir el cupn correspondiente y llevarlo con su receta a la farmacia.  - Tambin puede pasar por nuestra oficina durante el horario de atencin regular y recoger una tarjeta de cupones de GoodRx.  - Si necesita que su receta se enve   electrnicamente a una farmacia diferente, informe a nuestra oficina a travs de MyChart de [place] o por telfono llamando al [phone] y presione la opcin 4.  

## 2022-09-19 ENCOUNTER — Encounter: Payer: Self-pay | Admitting: Dermatology

## 2023-01-05 ENCOUNTER — Other Ambulatory Visit: Payer: Self-pay

## 2023-01-05 ENCOUNTER — Inpatient Hospital Stay
Admission: EM | Admit: 2023-01-05 | Discharge: 2023-01-09 | DRG: 871 | Disposition: A | Discharge status: Home / Self Care | Payer: Medicare Other | Attending: Hospitalist | Admitting: Hospitalist

## 2023-01-05 ENCOUNTER — Emergency Department: Payer: Medicare Other

## 2023-01-05 DIAGNOSIS — A4151 Sepsis due to Escherichia coli [E. coli]: Principal | ICD-10-CM | POA: Diagnosis present

## 2023-01-05 DIAGNOSIS — Z7902 Long term (current) use of antithrombotics/antiplatelets: Secondary | ICD-10-CM

## 2023-01-05 DIAGNOSIS — R6520 Severe sepsis without septic shock: Secondary | ICD-10-CM | POA: Diagnosis not present

## 2023-01-05 DIAGNOSIS — A419 Sepsis, unspecified organism: Secondary | ICD-10-CM | POA: Diagnosis not present

## 2023-01-05 DIAGNOSIS — Z79899 Other long term (current) drug therapy: Secondary | ICD-10-CM | POA: Diagnosis not present

## 2023-01-05 DIAGNOSIS — N12 Tubulo-interstitial nephritis, not specified as acute or chronic: Secondary | ICD-10-CM | POA: Diagnosis present

## 2023-01-05 DIAGNOSIS — N136 Pyonephrosis: Secondary | ICD-10-CM | POA: Diagnosis present

## 2023-01-05 DIAGNOSIS — I480 Paroxysmal atrial fibrillation: Secondary | ICD-10-CM | POA: Diagnosis present

## 2023-01-05 DIAGNOSIS — Z85828 Personal history of other malignant neoplasm of skin: Secondary | ICD-10-CM | POA: Diagnosis not present

## 2023-01-05 DIAGNOSIS — E872 Acidosis, unspecified: Secondary | ICD-10-CM | POA: Diagnosis present

## 2023-01-05 DIAGNOSIS — Z87440 Personal history of urinary (tract) infections: Secondary | ICD-10-CM

## 2023-01-05 DIAGNOSIS — Z91013 Allergy to seafood: Secondary | ICD-10-CM

## 2023-01-05 DIAGNOSIS — R6521 Severe sepsis with septic shock: Secondary | ICD-10-CM | POA: Diagnosis present

## 2023-01-05 DIAGNOSIS — I4891 Unspecified atrial fibrillation: Secondary | ICD-10-CM

## 2023-01-05 DIAGNOSIS — Z6832 Body mass index (BMI) 32.0-32.9, adult: Secondary | ICD-10-CM

## 2023-01-05 DIAGNOSIS — E669 Obesity, unspecified: Secondary | ICD-10-CM | POA: Diagnosis present

## 2023-01-05 DIAGNOSIS — Z87891 Personal history of nicotine dependence: Secondary | ICD-10-CM | POA: Diagnosis not present

## 2023-01-05 DIAGNOSIS — Z7901 Long term (current) use of anticoagulants: Secondary | ICD-10-CM | POA: Diagnosis not present

## 2023-01-05 DIAGNOSIS — I120 Hypertensive chronic kidney disease with stage 5 chronic kidney disease or end stage renal disease: Secondary | ICD-10-CM | POA: Diagnosis present

## 2023-01-05 DIAGNOSIS — D631 Anemia in chronic kidney disease: Secondary | ICD-10-CM | POA: Diagnosis present

## 2023-01-05 DIAGNOSIS — D696 Thrombocytopenia, unspecified: Secondary | ICD-10-CM | POA: Diagnosis present

## 2023-01-05 DIAGNOSIS — R112 Nausea with vomiting, unspecified: Secondary | ICD-10-CM | POA: Diagnosis present

## 2023-01-05 DIAGNOSIS — N186 End stage renal disease: Secondary | ICD-10-CM

## 2023-01-05 DIAGNOSIS — N2581 Secondary hyperparathyroidism of renal origin: Secondary | ICD-10-CM | POA: Diagnosis present

## 2023-01-05 DIAGNOSIS — I77810 Thoracic aortic ectasia: Secondary | ICD-10-CM | POA: Diagnosis present

## 2023-01-05 DIAGNOSIS — G4733 Obstructive sleep apnea (adult) (pediatric): Secondary | ICD-10-CM | POA: Diagnosis present

## 2023-01-05 DIAGNOSIS — Z992 Dependence on renal dialysis: Secondary | ICD-10-CM

## 2023-01-05 DIAGNOSIS — E785 Hyperlipidemia, unspecified: Secondary | ICD-10-CM | POA: Diagnosis present

## 2023-01-05 DIAGNOSIS — Z888 Allergy status to other drugs, medicaments and biological substances status: Secondary | ICD-10-CM

## 2023-01-05 DIAGNOSIS — N390 Urinary tract infection, site not specified: Secondary | ICD-10-CM | POA: Diagnosis not present

## 2023-01-05 DIAGNOSIS — N189 Chronic kidney disease, unspecified: Secondary | ICD-10-CM | POA: Diagnosis present

## 2023-01-05 DIAGNOSIS — R1114 Bilious vomiting: Secondary | ICD-10-CM | POA: Diagnosis not present

## 2023-01-05 DIAGNOSIS — E878 Other disorders of electrolyte and fluid balance, not elsewhere classified: Secondary | ICD-10-CM | POA: Diagnosis present

## 2023-01-05 DIAGNOSIS — Z881 Allergy status to other antibiotic agents status: Secondary | ICD-10-CM

## 2023-01-05 DIAGNOSIS — N1330 Unspecified hydronephrosis: Secondary | ICD-10-CM | POA: Diagnosis not present

## 2023-01-05 DIAGNOSIS — E1122 Type 2 diabetes mellitus with diabetic chronic kidney disease: Secondary | ICD-10-CM | POA: Diagnosis present

## 2023-01-05 DIAGNOSIS — Z7984 Long term (current) use of oral hypoglycemic drugs: Secondary | ICD-10-CM

## 2023-01-05 DIAGNOSIS — B9620 Unspecified Escherichia coli [E. coli] as the cause of diseases classified elsewhere: Secondary | ICD-10-CM | POA: Diagnosis not present

## 2023-01-05 DIAGNOSIS — Z8249 Family history of ischemic heart disease and other diseases of the circulatory system: Secondary | ICD-10-CM | POA: Diagnosis not present

## 2023-01-05 DIAGNOSIS — K2970 Gastritis, unspecified, without bleeding: Secondary | ICD-10-CM | POA: Diagnosis present

## 2023-01-05 DIAGNOSIS — I10 Essential (primary) hypertension: Secondary | ICD-10-CM | POA: Diagnosis present

## 2023-01-05 DIAGNOSIS — Z8673 Personal history of transient ischemic attack (TIA), and cerebral infarction without residual deficits: Secondary | ICD-10-CM

## 2023-01-05 LAB — LACTIC ACID, PLASMA
Lactic Acid, Venous: 5.9 mmol/L (ref 0.5–1.9)
Lactic Acid, Venous: 5.1 mmol/L (ref 0.5–1.9)
Lactic Acid, Venous: 2.3 mmol/L (ref 0.5–1.9)

## 2023-01-05 LAB — BETA-HYDROXYBUTYRIC ACID: Beta-Hydroxybutyric Acid: 0.15 mmol/L (ref 0.05–0.27)

## 2023-01-05 LAB — URINALYSIS, W/ REFLEX TO CULTURE (INFECTION SUSPECTED)
Bilirubin Urine: NEGATIVE
Glucose, UA: 50 mg/dL — AB
Ketones, ur: NEGATIVE mg/dL
Nitrite: NEGATIVE
Protein, ur: 100 mg/dL — AB
Specific Gravity, Urine: 1.015 (ref 1.005–1.030)
WBC, UA: >50 WBC/hpf (ref 0–5)
pH: 5 (ref 5.0–8.0)

## 2023-01-05 LAB — COMPREHENSIVE METABOLIC PANEL
ALT: 18 U/L (ref 0–44)
AST: 18 U/L (ref 15–41)
Albumin: 3.9 g/dL (ref 3.5–5.0)
Alkaline Phosphatase: 116 U/L (ref 38–126)
Anion gap: 20 — ABNORMAL HIGH (ref 5–15)
BUN: 77 mg/dL — ABNORMAL HIGH (ref 8–23)
CO2: 19 mmol/L — ABNORMAL LOW (ref 22–32)
Calcium: 9 mg/dL (ref 8.9–10.3)
Chloride: 99 mmol/L (ref 98–111)
Creatinine, Ser: 10.27 mg/dL — ABNORMAL HIGH (ref 0.61–1.24)
GFR, Estimated: 5 mL/min — ABNORMAL LOW (ref >60)
Glucose, Bld: 198 mg/dL — ABNORMAL HIGH (ref 70–99)
Potassium: 4.4 mmol/L (ref 3.5–5.1)
Sodium: 138 mmol/L (ref 135–145)
Total Bilirubin: 0.9 mg/dL (ref 0.3–1.2)
Total Protein: 7.9 g/dL (ref 6.5–8.1)

## 2023-01-05 LAB — CBC
HCT: 36.9 % — ABNORMAL LOW (ref 39.0–52.0)
Hemoglobin: 12.1 g/dL — ABNORMAL LOW (ref 13.0–17.0)
MCH: 31.4 pg (ref 26.0–34.0)
MCHC: 32.8 g/dL (ref 30.0–36.0)
MCV: 95.8 fL (ref 80.0–100.0)
Platelets: 181 10*3/uL (ref 150–400)
RBC: 3.85 MIL/uL — ABNORMAL LOW (ref 4.22–5.81)
RDW: 13.7 % (ref 11.5–15.5)
WBC: 13 10*3/uL — ABNORMAL HIGH (ref 4.0–10.5)
nRBC: 0 % (ref 0.0–0.2)

## 2023-01-05 LAB — BLOOD GAS, VENOUS
Acid-base deficit: 8.3 mmol/L — ABNORMAL HIGH (ref 0.0–2.0)
Bicarbonate: 18.4 mmol/L — ABNORMAL LOW (ref 20.0–28.0)
O2 Saturation: 55.3 %
Patient temperature: 37
pCO2, Ven: 41 mm[Hg] — ABNORMAL LOW (ref 44–60)
pH, Ven: 7.26 (ref 7.25–7.43)
pO2, Ven: 36 mm[Hg] (ref 32–45)

## 2023-01-05 LAB — MAGNESIUM: Magnesium: 1.3 mg/dL — ABNORMAL LOW (ref 1.7–2.4)

## 2023-01-05 LAB — LIPASE, BLOOD: Lipase: 48 U/L (ref 11–51)

## 2023-01-05 MED ORDER — INSULIN ASPART 100 UNIT/ML IJ SOLN: 0.0000 [IU] | Freq: Three times a day (TID) | INTRAMUSCULAR | Status: DC

## 2023-01-05 MED ORDER — SODIUM CHLORIDE 0.9% FLUSH: 3.0000 mL | INTRAVENOUS | Status: DC | PRN

## 2023-01-05 MED ORDER — VANCOMYCIN HCL IN DEXTROSE 1-5 GM/200ML-% IV SOLN
1000.0000 mg | Freq: Once | INTRAVENOUS | Status: AC
  Administered 2023-01-05: 1000 mg via INTRAVENOUS
  Filled 2023-01-05: qty 200

## 2023-01-05 MED ORDER — ALBUTEROL SULFATE (2.5 MG/3ML) 0.083% IN NEBU: 2.5000 mg | INHALATION_SOLUTION | RESPIRATORY_TRACT | Status: DC | PRN

## 2023-01-05 MED ORDER — CLOPIDOGREL BISULFATE 75 MG PO TABS
75.0000 mg | ORAL_TABLET | Freq: Every evening | ORAL | Status: DC
  Administered 2023-01-06: 75 mg via ORAL
  Filled 2023-01-05: qty 1

## 2023-01-05 MED ORDER — HEPARIN (PORCINE) 25000 UT/250ML-% IV SOLN
1300.0000 [IU]/h | INTRAVENOUS | Status: DC
  Administered 2023-01-05 – 2023-01-07 (×3): 1300 [IU]/h via INTRAVENOUS
  Filled 2023-01-05 (×3): qty 250

## 2023-01-05 MED ORDER — METRONIDAZOLE 500 MG/100ML IV SOLN
500.0000 mg | Freq: Two times a day (BID) | INTRAVENOUS | Status: DC
  Administered 2023-01-06 (×2): 500 mg via INTRAVENOUS
  Filled 2023-01-05 (×2): qty 100

## 2023-01-05 MED ORDER — SODIUM CHLORIDE 0.9 % IV BOLUS (SEPSIS)
500.0000 mL | Freq: Once | INTRAVENOUS | Status: AC
  Administered 2023-01-05: 500 mL via INTRAVENOUS

## 2023-01-05 MED ORDER — LACTATED RINGERS IV SOLN: INTRAVENOUS | Status: DC

## 2023-01-05 MED ORDER — SODIUM CHLORIDE 0.9% FLUSH
3.0000 mL | Freq: Two times a day (BID) | INTRAVENOUS | Status: DC
  Administered 2023-01-06 – 2023-01-09 (×7): 3 mL via INTRAVENOUS

## 2023-01-05 MED ORDER — SODIUM CHLORIDE 0.9 % IV SOLN
2.0000 g | Freq: Once | INTRAVENOUS | Status: AC
  Administered 2023-01-05: 2 g via INTRAVENOUS
  Filled 2023-01-05: qty 12.5

## 2023-01-05 MED ORDER — SODIUM CHLORIDE 0.9 % IV BOLUS (SEPSIS): 1000.0000 mL | Freq: Once | INTRAVENOUS | Status: DC

## 2023-01-05 MED ORDER — PANTOPRAZOLE SODIUM 40 MG IV SOLR
40.0000 mg | Freq: Two times a day (BID) | INTRAVENOUS | Status: DC
  Administered 2023-01-05 – 2023-01-06 (×3): 40 mg via INTRAVENOUS
  Filled 2023-01-05 (×3): qty 10

## 2023-01-05 MED ORDER — SODIUM CHLORIDE 0.9 % IV BOLUS (SEPSIS)
1000.0000 mL | Freq: Once | INTRAVENOUS | Status: AC
  Administered 2023-01-05: 1000 mL via INTRAVENOUS

## 2023-01-05 MED ORDER — ONDANSETRON HCL 4 MG/2ML IJ SOLN
4.0000 mg | Freq: Once | INTRAMUSCULAR | Status: AC
  Administered 2023-01-05: 4 mg via INTRAVENOUS
  Filled 2023-01-05: qty 2

## 2023-01-05 MED ORDER — ACETAMINOPHEN 650 MG RE SUPP: 650.0000 mg | Freq: Four times a day (QID) | RECTAL | Status: DC | PRN

## 2023-01-05 MED ORDER — ATORVASTATIN CALCIUM 20 MG PO TABS
40.0000 mg | ORAL_TABLET | Freq: Every day | ORAL | Status: DC
  Administered 2023-01-06 – 2023-01-08 (×3): 40 mg via ORAL
  Filled 2023-01-05 (×3): qty 2

## 2023-01-05 MED ORDER — METRONIDAZOLE 500 MG/100ML IV SOLN
500.0000 mg | Freq: Once | INTRAVENOUS | Status: AC
  Administered 2023-01-05: 500 mg via INTRAVENOUS
  Filled 2023-01-05 (×2): qty 100

## 2023-01-05 MED ORDER — ALBUTEROL SULFATE HFA 108 (90 BASE) MCG/ACT IN AERS: 1.0000 | INHALATION_SPRAY | RESPIRATORY_TRACT | Status: DC | PRN

## 2023-01-05 MED ORDER — ACETAMINOPHEN 325 MG PO TABS
650.0000 mg | ORAL_TABLET | Freq: Four times a day (QID) | ORAL | Status: DC | PRN
  Administered 2023-01-06: 650 mg via ORAL
  Filled 2023-01-05: qty 2

## 2023-01-05 MED ORDER — HEPARIN BOLUS VIA INFUSION
4700.0000 [IU] | Freq: Once | INTRAVENOUS | Status: AC
  Administered 2023-01-05: 4700 [IU] via INTRAVENOUS
  Filled 2023-01-05: qty 4700

## 2023-01-05 NOTE — Consult Note (Addendum)
PHARMACY -  BRIEF ANTIBIOTIC NOTE   Pharmacy has received consult(s) for sepsis from an ED provider.  The patient's profile has been reviewed for ht/wt/allergies/indication/available labs.    One time order(s) placed for  -Vancomycin 1,000 mg - Cefepime 2g  - Flagyl 500 mg  Further antibiotics/pharmacy consults should be ordered by admitting physician if indicated.                       Thank you, Effie Shy PGY1 Pharmacy Resident 01/05/2023  6:03 PM

## 2023-01-05 NOTE — Sepsis Progress Note (Signed)
eLink is following this Code Sepsis. °

## 2023-01-05 NOTE — Hospital Course (Signed)
Abdominal exam is nontender PD catheter is in place,

## 2023-01-05 NOTE — H&P (Incomplete)
History and Physical    Patient: [name] OVF:[id number] DOB: 16-Feb-1946 DOA: 01/05/2023 DOS: the patient was seen and examined on 01/06/2023 PCP: Lauro Regulus, MD  Patient coming from: Home   Chief Complaint:  Chief Complaint  Patient presents with   UTI   Emesis   HPI: Tyler Wiley is a 77 y.o. male with medical history significant for diabetes mellitus type 2, hypertension, OSA, history of stroke, gastritis, cholecystectomy history, dyslipidemia, on ESRD on peritoneal dialysis patient still makes urine and voids,  Presenting with generalized weakness nausea vomiting.  Ill-appearing, hypotensive, dizzy, tachycardic, pale appearing on initial presentation.  Per report patient started vomiting this morning.  Patient was diagnosed with UTI by primary care and started on antibiotics.  On arrival patient was hypotensive found to be in A-fib. Vitals on arrival show heart rate of 31 and then 121 with respirations in the 20s send blood pressure as low as 86/43 improving to 130/67.  CMP shows bicarb of 19 glucose 198 BUN of 77 creatinine of 10.27 anion gap of 20 and normal LFTs.  Initial lactic of 5.9 repeat of 5.1 third lactic is pending which I suspect is secondary to hypotension.  CBC shows leukocytosis of 13 and hemoglobin of 12.1 normal platelets of 181.  Overall patient meets severe sepsis criteria with suspicion infection attributed to his pyelonephritis on noncontrast CT.  Patient has elevated anion gap metabolic acidosis however beta hydroxybutyrate acid is normal and glucose is 198 attribute acidosis to patient's elevated lactic acid. Wife at bedside who is a nurse states that he has not been feeling well for nearly 2 and half weeks where he has had symptoms of his UTI nausea and vomiting patient was initially started on ciprofloxacin for his UTI by PCP at [facility] clinic and was changed to Septra.  CT scan shows mild right hydronephrosis and hydroureter with perinephric stranding  stranding with no obstructing stones  several cystic lesion of the right kidney recommendations for renal ultrasound.  Trace abdominal ascites. PD nurse saw pt in ed and sent PD fluid for analysis.   In the ED patient meets severe sepsis criteria patient started on vancomycin cefepime and Flagyl regimen. At bedside evaluation patient was in A-fib RVR blood pressures were soft and discussed with wife that we will admit him to stepdown and possibly ICU if he ends up needing a diltiazem drip for his A-fib RVR.  Also discussed with the ICU APP on-call about the patient in case he needs to transition to ICU for IV drip for vasopressor support. Patient given magnesium replacement.  Review of Systems: Review of Systems  Constitutional:  Positive for malaise/fatigue. Negative for fever.  Gastrointestinal:  Positive for nausea and vomiting.  Neurological:  Positive for dizziness and weakness.  All other systems reviewed and are negative.  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 His smoking use included cigarettes. He has never used smokeless tobacco. He reports current alcohol use. He reports that he does not use drugs.  Allergies  Allergen Reactions   Cefuroxime     Other: Mouth ulcers, tongue swollen.  Tolerated Ancef.   Family  History  Problem Relation Age of Onset   CAD Father    Prior to Admission medications   Medication Sig Start Date End Date Taking? Authorizing Provider  albuterol (VENTOLIN HFA) 108 (90 Base) MCG/ACT inhaler Inhale 1-2 puffs into the lungs every 4 (four) hours as needed for wheezing or shortness of breath.    [provider]  amLODipine (NORVASC) 10 MG tablet Take 1 tablet (10 mg total) by mouth daily. 05/08/18   Mayo, Allyn Kenner, MD  atorvastatin (LIPITOR) 40 MG tablet Take 1 tablet (40 mg total) by mouth daily at 6 PM. 05/08/18   Mayo, Allyn Kenner, MD  carvedilol (COREG) 12.5 MG tablet Take 12.5 mg by mouth 2 (two) times daily with a meal.     [provider]  clopidogrel (PLAVIX) 75 MG tablet Take 75 mg by mouth every evening.     [provider]  clotrimazole-betamethasone (LOTRISONE) cream APPLY TO AFFECTED AREA TWICE A DAY 06/01/22   McKenzie, Mardene Celeste, MD  fluorouracil (EFUDEX) 5 % cream Apply topically 2 (two) times daily. Starting October the 1st apply to the L nose BID x 7 days. 12/03/21   Deirdre Evener, MD  fluticasone (FLONASE) 50 MCG/ACT nasal spray Place 2 sprays into both nostrils daily. Patient taking differently: Place 2 sprays into both nostrils daily as needed for allergies.  05/08/18 01/16/19  Mayo, Allyn Kenner, MD  glimepiride (AMARYL) 2 MG tablet Take 2 mg by mouth daily with breakfast.    [provider]  ondansetron (ZOFRAN-ODT) 8 MG disintegrating tablet Take 1-2 tablets (8-16 mg total) by mouth every 8 (eight) hours as needed for Nausea for up to 7 days 06/15/22   [provider]  pantoprazole (PROTONIX) 40 MG tablet Take 40 mg by mouth daily.    [provider]  sitaGLIPtin (JANUVIA) 100 MG tablet Take 25 mg by mouth daily.     [provider]  torsemide (DEMADEX) 10 MG tablet Take 10 mg by mouth daily.    [provider]  TRADJENTA 5 MG TABS tablet Take 5 mg by mouth daily.    [provider]    Vitals:   01/05/23 2323 01/05/23 2330 01/06/23 0000 01/06/23 0200  BP: (!) 115/47 112/84 (!) 106/48 (!) 140/70  Pulse: 96 (!) 107 63 96  Resp: (!) 22 (!) 26 (!) 28 19  Temp: 99.7 F (37.6 C)     TempSrc: Oral     SpO2: 96% 94% 94% 93%  Weight:      Height:       Physical Exam Vitals and nursing note reviewed.  Constitutional:      General: He is not in acute distress. HENT:     Head: Normocephalic and atraumatic.     Right Ear: Hearing and external ear normal.     Left Ear: Hearing and external ear normal.     Nose: Nose normal. No nasal deformity.     Mouth/Throat:     Lips: Pink.     Tongue: No lesions.     Pharynx:  Oropharynx is clear.  Eyes:     General: Lids are normal.     Extraocular Movements: Extraocular movements intact.  Cardiovascular:     Rate and Rhythm: Normal rate. Rhythm irregular.     Pulses: Normal pulses.     Heart sounds: Normal heart sounds.  Pulmonary:     Effort: Pulmonary effort is normal.     Breath sounds: Normal breath sounds.  Abdominal:     General: Bowel sounds are normal. There is no distension.     Palpations: Abdomen is soft. There is no mass.     Tenderness: There is no abdominal tenderness.  Musculoskeletal:     Right lower leg: No edema.     Left lower leg: No edema.  Skin:    General: Skin is warm.  Neurological:     General: No focal deficit present.     Mental Status: He is alert and oriented to person, place, and time.     Cranial Nerves: Cranial nerves 2-12 are intact.  Psychiatric:        Attention and Perception: Attention normal.        Mood and Affect: Mood normal.        Speech: Speech normal.        Behavior: Behavior normal. Behavior is cooperative.    Labs on Admission: I have personally reviewed following labs and imaging studies CBC: Recent Labs  Lab 01/05/23 1535  WBC 13.0*  HGB 12.1*  HCT 36.9*  MCV 95.8  PLT 181   Basic Metabolic Panel: Recent Labs  Lab 01/05/23 1535 01/05/23 2004  NA 138   --   K 4.4  --   CL 99  --   CO2 19*  --   GLUCOSE 198*  --   BUN 77*  --   CREATININE 10.27*  --   CALCIUM 9.0  --   MG  --  1.3*   GFR: Estimated Creatinine Clearance: 7.2 mL/min (A) (by C-G formula based on SCr of 10.27 mg/dL (H)). Liver Function Tests: Recent Labs  Lab 01/05/23 1535  AST 18  ALT 18  ALKPHOS 116  BILITOT 0.9  PROT 7.9  ALBUMIN 3.9   Recent Labs  Lab 01/05/23 1535  LIPASE 48   No results for input(s): "AMMONIA" in the last 168 hours. Coagulation Profile: No results for input(s): "INR", "PROTIME" in the last 168 hours. Cardiac Enzymes: No results for input(s): "CKTOTAL", "CKMB", "CKMBINDEX", "TROPONINI" in the last 168 hours. BNP (last 3 results) No results for input(s): "PROBNP" in the last 8760 hours. HbA1C: No results for input(s): "HGBA1C" in the last 72 hours. CBG: Recent Labs  Lab 01/06/23 0034  GLUCAP 119*   Lipid Profile: No results for input(s): "CHOL", "HDL", "LDLCALC", "TRIG", "CHOLHDL", "LDLDIRECT" in the last 72 hours. Thyroid Function Tests: No results for input(s): "TSH", "T4TOTAL", "FREET4", "T3FREE", "THYROIDAB" in the last 72 hours. Anemia Panel: No results for input(s): "VITAMINB12", "FOLATE", "FERRITIN", "TIBC", "IRON", "RETICCTPCT" in the last 72 hours. Urinalysis    Component Value Date/Time   COLORURINE YELLOW (A) 01/05/2023 2224   APPEARANCEUR CLOUDY (A) 01/05/2023 2224   APPEARANCEUR Clear 10/29/2012 0244   LABSPEC 1.015 01/05/2023 2224   LABSPEC 1.008 10/29/2012 0244   PHURINE 5.0 01/05/2023 2224   GLUCOSEU 50 (A) 01/05/2023 2224   GLUCOSEU 50 mg/dL 04/54/0981 1914   HGBUR SMALL (A) 01/05/2023 2224   BILIRUBINUR NEGATIVE 01/05/2023 2224   BILIRUBINUR Negative 10/29/2012 0244   KETONESUR NEGATIVE 01/05/2023 2224  PROTEINUR 100 (A) 01/05/2023 2224   NITRITE NEGATIVE 01/05/2023 2224   LEUKOCYTESUR LARGE (A) 01/05/2023 2224   LEUKOCYTESUR Negative 10/29/2012 0244   Unresulted Labs (From admission, onward)      Start     Ordered   01/06/23 0500  Heparin level (unfractionated)  Tomorrow morning,   R        01/05/23 2016   01/06/23 0500  CBC  Tomorrow morning,   R        01/05/23 2018   01/06/23 0500  Hepatitis B surface antigen  Tomorrow morning,   R        01/05/23 2158   01/06/23 0500  Hepatitis B surface antibody,quantitative  Tomorrow morning,   R        01/05/23 2158   01/06/23 0500  Protime-INR  Tomorrow morning,   R        01/05/23 2333   01/06/23 0500  Cortisol-am, blood  Tomorrow morning,   R        01/05/23 2333   01/06/23 0500  Procalcitonin  Tomorrow morning,   R       References:    Procalcitonin Lower Respiratory Tract Infection AND Sepsis Procalcitonin Algorithm   01/05/23 2333   01/05/23 2224  Urine Culture  Once,   R        01/05/23 2224   01/05/23 2059  Hemoglobin A1c  Once,   URGENT       Comments: To assess prior glycemic control    01/05/23 2058   01/05/23 1613  Blood Culture (routine x 2)  (Undifferentiated presentation (screening labs and basic nursing orders))  BLOOD CULTURE X 2,   STAT      01/05/23 1613   Pending  Body fluid cell count with differential  Once,   R       Question:  Are there also cytology or pathology orders on this specimen?  Answer:  No   Pending           Medications  lactated ringers infusion ( Intravenous Rate/Dose Change 01/06/23 0153)  heparin bolus via infusion 4,700 Units (4,700 Units Intravenous Bolus from Bag 01/05/23 2043)    Followed by  heparin ADULT infusion 100 units/mL (25000 units/249mL) (1,300 Units/hr Intravenous New Bag/Given 01/05/23 2043)  clopidogrel (PLAVIX) tablet 75 mg (0 mg Oral Hold 01/05/23 2321)  atorvastatin (LIPITOR) tablet 40 mg (has no administration in time range)  insulin aspart (novoLOG) injection 0-6 Units (has no administration in time range)  metroNIDAZOLE (FLAGYL) IVPB 500 mg (500 mg Intravenous Not Given 01/05/23 2219)  pantoprazole (PROTONIX) injection 40 mg (40 mg Intravenous Given 01/05/23 2324)   albuterol (PROVENTIL) (2.5 MG/3ML) 0.083% nebulizer solution 2.5 mg (has no administration in time range)  sodium chloride flush (NS) 0.9 % injection 3 mL (3 mLs Intravenous Not Given 01/06/23 0004)  sodium chloride flush (NS) 0.9 % injection 3 mL (has no administration in time range)  acetaminophen (TYLENOL) tablet 650 mg (has no administration in time range)    Or  acetaminophen (TYLENOL) suppository 650 mg (has no administration in time range)  magnesium sulfate IVPB 2 g 50 mL (2 g Intravenous New Bag/Given 01/06/23 0149)  vancomycin variable dose per unstable renal function (pharmacist dosing) (has no administration in time range)  ceFEPIme (MAXIPIME) 1 g in sodium chloride 0.9 % 100 mL IVPB (has no administration in time range)  melatonin tablet 2.5 mg (2.5 mg Oral Given 01/06/23 0232)  sodium chloride 0.9 % bolus 500  mL (0 mLs Intravenous Stopped 01/05/23 1735)  ondansetron (ZOFRAN) injection 4 mg (4 mg Intravenous Given 01/05/23 1702)  ceFEPIme (MAXIPIME) 2 g in sodium chloride 0.9 % 100 mL IVPB (0 g Intravenous Stopped 01/05/23 1758)  metroNIDAZOLE (FLAGYL) IVPB 500 mg (0 mg Intravenous Stopped 01/05/23 1952)  vancomycin (VANCOCIN) IVPB 1000 mg/200 mL premix (0 mg Intravenous Stopped 01/05/23 2315)  sodium chloride 0.9 % bolus 1,000 mL (0 mLs Intravenous Stopped 01/05/23 1935)  sodium chloride 0.9 % bolus 1,000 mL (0 mLs Intravenous Stopped 01/05/23 2315)  ondansetron (ZOFRAN) injection 4 mg (4 mg Intravenous Given 01/05/23 2221)  vancomycin (VANCOCIN) IVPB 1000 mg/200 mL premix (0 mg Intravenous Stopped 01/06/23 0206)  sodium chloride 0.9 % bolus 500 mL (500 mLs Intravenous New Bag/Given 01/06/23 0153)   Radiological Exams on Admission: CT ABDOMEN PELVIS WO CONTRAST  Result Date: 01/05/2023 CLINICAL DATA:  Abdominal pain EXAM: CT ABDOMEN AND PELVIS WITHOUT CONTRAST TECHNIQUE: Multidetector CT imaging of the abdomen and pelvis was performed following the standard protocol without IV contrast.  RADIATION DOSE REDUCTION: This exam was performed according to the departmental dose-optimization program which includes automated exposure control, adjustment of the mA and/or kV according to patient size and/or use of iterative reconstruction technique. COMPARISON:  CT abdomen and pelvis dated October 17, 2012 FINDINGS: Lower chest: No acute abnormality. Hepatobiliary: No focal liver abnormality is seen. Status post cholecystectomy. No biliary dilatation. Pancreas: Unremarkable. No pancreatic ductal dilatation or surrounding inflammatory changes. Spleen: Normal in size without focal abnormality. Adrenals/Urinary Tract: Bilateral adrenal glands are unremarkable. Mild right hydronephrosis and hydroureter with asymmetric right perinephric stranding. No evidence of obstructing stone. Several cystic lesions of the right kidney are seen which are technically indeterminate, possibly due to streak artifact. Reference mildly hyperattenuating lesion of the interpolar region measuring 3.5 cm. Additional bilateral renal lesions are seen which are consistent with simple cysts. Bladder is unremarkable. Stomach/Bowel: Stomach is within normal limits. Appendix appears normal. No evidence of bowel wall thickening, distention, or inflammatory changes. Vascular/Lymphatic: Aortic atherosclerosis. No enlarged abdominal or pelvic lymph nodes. Reproductive: Fiducial markers noted in the prostate. Other: Peritoneal dialysis catheter and trace abdominal ascites. No intra-abdominal free air. Musculoskeletal: Diffuse osseous sclerosis, likely sequela of chronic renal disease. IMPRESSION: 1. Mild right hydronephrosis and hydroureter with asymmetric right perinephric stranding. No evidence of obstructing stone. Findings may be due to recently passed stone or ascending infection. Recommend correlation with urinalysis. 2. Several cystic lesions of the right kidney are seen which are technically indeterminate, possibly due to streak artifact.  Recommend further evaluation with nonemergent renal ultrasound. 3. Peritoneal dialysis catheter and trace abdominal ascites. 4. Aortic Atherosclerosis (ICD10-I70.0). Electronically Signed   By: Allegra Lai M.D.   On: 01/05/2023 19:07    Data Reviewed: Relevant notes from primary care and specialist visits, past discharge summaries as available in EHR, including Care Everywhere. Prior diagnostic testing as pertinent to current admission diagnoses Updated medications and problem lists for reconciliation ED course, including vitals, labs, imaging, treatment and response to treatment Triage notes, nursing and pharmacy notes and ED provider's notes Notable results as noted in HPI.  Assessment & Plan Nausea with vomiting 2/2 pyelonephritis. Pt coming with failed outpatient therapy with ciprofloxacin and septra.  Supportive care with antiemetics and IV fluids. Severe sepsis (HCC) Vitals:   01/05/23 1544 01/05/23 1603 01/05/23 1754 01/05/23 1846  BP: 122/62 (!) 86/43 (!) 96/59 99/77   01/05/23 1900 01/05/23 2323 01/05/23 2330 01/06/23 0000  BP: 107/63 (!) 115/47 112/84 Marland Kitchen)  106/48   01/06/23 0200  BP: (!) 140/70   2/2 to pyelonephritis. Cont broad spectrum VI abx with vancomycin./cefepime and flagyl . Follow PDF analysis and blood cultures.  Pyelonephritis Continue cefepime/ flagyl and vancomycin.  Urinalysis    Component Value Date/Time   COLORURINE YELLOW (A) 01/05/2023 2224   APPEARANCEUR CLOUDY (A) 01/05/2023 2224   APPEARANCEUR Clear 10/29/2012 0244   LABSPEC 1.015 01/05/2023 2224   LABSPEC 1.008 10/29/2012 0244   PHURINE 5.0 01/05/2023 2224   GLUCOSEU 50 (A) 01/05/2023 2224   GLUCOSEU 50 mg/dL 44/04/270 5366   HGBUR SMALL (A) 01/05/2023 2224   BILIRUBINUR NEGATIVE 01/05/2023 2224   BILIRUBINUR Negative 10/29/2012 0244   KETONESUR NEGATIVE 01/05/2023 2224   PROTEINUR 100 (A) 01/05/2023 2224   NITRITE NEGATIVE 01/05/2023 2224   LEUKOCYTESUR LARGE (A) 01/05/2023 2224    LEUKOCYTESUR Negative 10/29/2012 0244  LR at 50 cc/hour.   Intake/Output Summary (Last 24 hours) at 01/06/2023 0242 Last data filed at 01/06/2023 0206 Gross per 24 hour  Intake 3500 ml  Output --  Net 3500 ml     Electrolyte abnormality Replace and follow levels.  Pharmacy consult.   HTN (hypertension) Home regimen of amlodipine  and torsemide to be held and start lopressor or diltiazem. Paroxysmal atrial fibrillation (HCC) Pt presenting with new a.fib rvr.  CHA2DS2/VAS Stroke Risk Points  Current as of 8 minutes ago     6 >= 2 Points: High Risk  1 to 1.99 Points: Medium Risk  0 Points: Low Risk    No Change     Details    This score determines the patient's risk of having a stroke if the  patient has atrial fibrillation.   Points Metrics  0 Has Congestive Heart Failure:  No    Current as of 8 minutes ago  0 Has Vascular Disease:  No    Current as of 8 minutes ago  1 Has Hypertension:  Yes     Current as of 8 minutes ago  2 Age:  27    Current as of 8 minutes ago  1 Has Diabetes:  Yes     Current as of 8 minutes ago  2 Had Stroke:  Yes  Had TIA:  Yes  Had Thromboembolism:  No    Current as of 8 minutes ago  0 Male:  No    Current as of 8 minutes ago   Pt started on heparin gtt due to high risk.  Gastritis Iv ppi.  OSA (obstructive sleep apnea) Cpap per home settings.  Type 2 diabetes mellitus with chronic kidney disease on chronic dialysis, without long-term current use of insulin (HCC) Glycemic protocol.  Hold insulin and Amaryl/ tradjenta / [place]ma Friendly.  Anemia of chronic kidney failure, unspecified stage Type/ screen.  Follow cbc.  Transfuse as deemed appropriate.  ESRD needing dialysis [facility]) Nephrology on board and plan to hold PD tonight to prevent further hypotension.  Hx of completed stroke Patient currently continued on heparin gtt. Hypomagnesemia 2 g replaced in the emergency room will follow levels.   DVT prophylaxis:  Heparin gtt   Consults:   Nephrology: Dr.Lateef.  Advance Care Planning:    Code Status: Full Code   Family Communication:  None   Disposition Plan:  Home   Severity of Illness: The appropriate patient status for this patient is INPATIENT. Inpatient status is judged to be reasonable and necessary in order to provide the required intensity of service to ensure the patient's safety. The patient's  presenting symptoms, physical exam findings, and initial radiographic and laboratory data in the context of their chronic comorbidities is felt to place them at high risk for further clinical deterioration. Furthermore, it is not anticipated that the patient will be medically stable for discharge from the hospital within 2 midnights of admission.   * I certify that at the point of admission it is my clinical judgment that the patient will require inpatient hospital care spanning beyond 2 midnights from the point of admission due to high intensity of service, high risk for further deterioration and high frequency of surveillance required.*  Author: Gertha Calkin, MD 01/06/2023 2:42 AM  For on call review [email or web address].

## 2023-01-05 NOTE — ED Notes (Signed)
Secure chat message sent to dr. Para March to inquire regarding plavix order since pt is on heparin

## 2023-01-05 NOTE — ED Triage Notes (Signed)
Pt comes with c/o UTI. Pt also states he started vomiting this morning and again after lunch. Pt states meds for UTI. Pt denies any fevers.  Pt has had chills and no fevers. Pt has peritoneal dialysis at home 6 days week.

## 2023-01-05 NOTE — ED Notes (Addendum)
Pt to triage 2 for reassessment, pt pale, denies pain, hypotensive, tachy.  EKG done.  Family with pt.  Polo Riley first nurse aware.  Skin warm and dry.  Pt to room 12h

## 2023-01-05 NOTE — ED Provider Notes (Addendum)
[facility] Provider Note    None    (approximate)   History   UTI and Emesis   HPI  Tyler Wiley is a 77 y.o. male past medical history diabetes, ESRD on PD, OSA, hypertension, presents to the emergency department for not feeling well.  Patient has been evaluated multiple times over the past week by primary care physician office.  Complaining of episodes of dizziness and feeling weak and off balance.  Diagnosed with urinary tract infection and started on a round of antibiotic.  Then switched to a new antibiotic after his culture resulted.  States that he recently took ciprofloxacin 250 mg daily as an adjusted lower dose.  Today had a significant turn for the worst according to his wife.  States that he had significantly worsening nausea, vomiting, lightheadedness and not feeling well.  When he arrived to the emergency department his blood pressure was found to be low.  Also found to be in atrial fibrillation which she states is new for him.  No history of anticoagulation.  No history of atrial fibrillation but has been evaluated by cardiology in the past for bradycardia.  Denies any abdominal pain, abdominal distention or pain to palpation.  Does still make a small amount of urine on a daily basis.   Physical Exam   Triage Vital Signs: ED Triage Vitals  Encounter Vitals Group     BP 01/05/23 1544 122/62     Systolic BP Percentile --      Diastolic BP Percentile --      Pulse Rate 01/05/23 1544 (!) 31     Resp 01/05/23 1544 18     Temp 01/05/23 1544 98.3 F (36.8 C)     Temp Source 01/05/23 1603 Oral     SpO2 01/05/23 1544 94 %     Weight --      Height --      Head Circumference --      Peak Flow --      Pain Score 01/05/23 1546 0     Pain Loc --      Pain Education --      Exclude from Growth Chart --     Most recent vital signs: Vitals:   01/05/23 1846 01/05/23 1900  BP: 99/77 107/63  Pulse:  99  Resp:  (!) 22  Temp:    SpO2:  96%     Physical Exam Constitutional:      Appearance: He is well-developed. He is ill-appearing.  HENT:     Head: Atraumatic.  Eyes:     Conjunctiva/sclera: Conjunctivae normal.  Cardiovascular:     Rate and Rhythm: Tachycardia present. Rhythm irregular.  Pulmonary:     Effort: No respiratory distress.     Breath sounds: No wheezing.  Abdominal:     Tenderness: There is no abdominal tenderness.     Comments: PD catheter in place.  No abdominal tenderness to palpation  Musculoskeletal:        General: Normal range of motion.     Cervical back: Normal range of motion.     Right lower leg: No edema.     Left lower leg: No edema.  Skin:    General: Skin is warm.     Capillary Refill: Capillary refill takes less than 2 seconds.  Neurological:     Mental Status: He is alert. Mental status is at baseline.     IMPRESSION / MDM / ASSESSMENT AND PLAN / ED  COURSE  I reviewed the triage vital signs and the nursing notes.  Differential diagnosis including complicated urinary tract infection, pyelonephritis, sepsis, intra-abdominal abscess, SBP  On arrival patient found to be afebrile but was tachycardic with a low blood pressure of 86/43.  Stable on room air.  Concern for possible sepsis picture.  Given that the patient does hemodialysis felt that 30 cc/kg of IV fluids may be detrimental to the patient, ordered 1L bolus and will reevaluate.  EKG  I, Corena Herter, the attending physician, personally viewed and interpreted this ECG.   Rate: 106  Rhythm: Atrial fibrillation  Axis: Normal  Intervals: Normal  ST&T Change: Nonspecific ST changes  Atrial fibrillation with a rapid rate while on cardiac telemetry.  RADIOLOGY I independently reviewed imaging, my interpretation of imaging: CT abdomen and pelvis without contrast -PD catheter in place without an obvious abscess.  Did note hydronephrosis to the right side but no obvious kidney stone.  CT scan read as mild right sided  hydronephrosis and hydroureter that was asymmetric with perinephric stranding.  Multiple cystic lesions to the right kidney and recommended nonemergent renal ultrasound.  Trace amount of abdominal ascites.  LABS (all labs ordered are listed, but only abnormal results are displayed) Labs interpreted as -    Labs Reviewed  COMPREHENSIVE METABOLIC PANEL - Abnormal; Notable for the following components:      Result Value   CO2 19 (*)    Glucose, Bld 198 (*)    BUN 77 (*)    Creatinine, Ser 10.27 (*)    GFR, Estimated 5 (*)    Anion gap 20 (*)    All other components within normal limits  CBC - Abnormal; Notable for the following components:   WBC 13.0 (*)    RBC 3.85 (*)    Hemoglobin 12.1 (*)    HCT 36.9 (*)    All other components within normal limits  LACTIC ACID, PLASMA - Abnormal; Notable for the following components:   Lactic Acid, Venous 5.9 (*)    All other components within normal limits  LACTIC ACID, PLASMA - Abnormal; Notable for the following components:   Lactic Acid, Venous 5.1 (*)    All other components within normal limits  CULTURE, BLOOD (ROUTINE X 2)  CULTURE, BLOOD (ROUTINE X 2)  BODY FLUID CULTURE W GRAM STAIN  LIPASE, BLOOD  BETA-HYDROXYBUTYRIC ACID  URINALYSIS, W/ REFLEX TO CULTURE (INFECTION SUSPECTED)     MDM  Given that the patient has ESRD and does still make urine after shared decision making with the patient and his wife at bedside we will proceed with noncontrasted CT scan of the abdomen and pelvis with the understanding if there was something concerning we may need to do another repeat scan we may need to use contrast to evaluate for an abscess.  Will proceed with CT scan noncontrasted.  Allergy listed to cefuroxime mean in the chart.  Consulted and discussed the case with pharmacy recommended vancomycin and cefepime despite the allergy given that he has had Ancef in the past.  Also ordered IV Flagyl to cover for intra-abdominal source.  Initial  lactic acid significantly elevated at 5.9, repeat lactic acid at 2 hours was 5.1.  Low suspicion for mesenteric ischemia.  Did have an elevated anion gap but likely secondary to his lactic acid and elevated BUN.  Normal beta hydroxybutyrate.  No signs of DKA.  Clinical Course as of 01/05/23 1934  Tue Jan 05, 2023  1623 Consulted dialysis nursing  team to get fluid from peritoneal dialysis to evaluate for SBP [SM]    Clinical Course User Index [SM] Corena Herter, MD   7:34 PM sepsis reevaluation.  Repeat 1 L of IV fluid ordered.  Consulted hospitalist for admission for sepsis likely in the setting of pyelonephritis.  SBP has not been ruled out given body fluid cell count is still in process.  On reevaluation did have significant improvement of his perfusion.  Given his new onset atrial fibrillation, unknown of onset, will start on heparin given CHA2DS2-VASc score 4.   PROCEDURES:  Critical Care performed: yes  .Critical Care  Performed by: Corena Herter, MD Authorized by: Corena Herter, MD   Critical care provider statement:    Critical care time (minutes):  80   Critical care time was exclusive of:  Separately billable procedures and treating other patients   Critical care was necessary to treat or prevent imminent or life-threatening deterioration of the following conditions:  Sepsis   Critical care was time spent personally by me on the following activities:  Development of treatment plan with patient or surrogate, discussions with consultants, evaluation of patient's response to treatment, examination of patient, ordering and review of laboratory studies, ordering and review of radiographic studies, ordering and performing treatments and interventions, pulse oximetry, re-evaluation of patient's condition and review of old charts   Patient's presentation is most consistent with acute presentation with potential threat to life or bodily function.   MEDICATIONS ORDERED IN  ED: Medications  lactated ringers infusion (has no administration in time range)  metroNIDAZOLE (FLAGYL) IVPB 500 mg (500 mg Intravenous New Bag/Given 01/05/23 1845)  vancomycin (VANCOCIN) IVPB 1000 mg/200 mL premix (has no administration in time range)  sodium chloride 0.9 % bolus 1,000 mL (has no administration in time range)  sodium chloride 0.9 % bolus 500 mL (0 mLs Intravenous Stopped 01/05/23 1735)  ondansetron (ZOFRAN) injection 4 mg (4 mg Intravenous Given 01/05/23 1702)  ceFEPIme (MAXIPIME) 2 g in sodium chloride 0.9 % 100 mL IVPB (0 g Intravenous Stopped 01/05/23 1758)  sodium chloride 0.9 % bolus 1,000 mL (1,000 mLs Intravenous New Bag/Given 01/05/23 1811)    FINAL CLINICAL IMPRESSION(S) / ED DIAGNOSES   Final diagnoses:  Severe sepsis (HCC)  Pyelonephritis  Atrial fibrillation, unspecified type (HCC)     Rx / DC Orders   ED Discharge Orders     None        Note:  This document was prepared using Dragon voice recognition software and may include unintentional dictation errors.   Corena Herter, MD 01/05/23 Kristopher Oppenheim    Corena Herter, MD 01/05/23 1610    Corena Herter, MD 01/05/23 9604

## 2023-01-05 NOTE — ED Notes (Signed)
Patient transported to CT 

## 2023-01-05 NOTE — ED Notes (Signed)
Dr. Allena Katz responded to message for plavix, hold at this time.

## 2023-01-05 NOTE — Consult Note (Signed)
ANTICOAGULATION CONSULT NOTE - Initial Consult  Pharmacy Consult for Heparin Indication: atrial fibrillation  Allergies  Allergen Reactions   Cefuroxime     Other: Mouth ulcers, tongue swollen.  Tolerated Ancef.    Patient Measurements:   Heparin Dosing Weight: 95 kg  Vital Signs: Temp: 98.4 F (36.9 C) (10/01 1603) Temp Source: Oral (10/01 1603) BP: 107/63 (10/01 1900) Pulse Rate: 99 (10/01 1900)  Labs: Recent Labs    01/05/23 1535  HGB 12.1*  HCT 36.9*  PLT 181  CREATININE 10.27*    CrCl cannot be calculated (Unknown ideal weight.).   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 Pressures were low on arrival and found to be in new onset afib. Pharmacy has been consulted to dose heparin.   Goal of Therapy:  Heparin level 0.3-0.7 units/ml Monitor platelets by anticoagulation protocol: Yes   Plan:  Give 4,700 units bolus x 1 Start heparin infusion at 1,300 units/hr Check anti-Xa level in 8 hours and daily while on heparin Continue to monitor H&H and platelets  Effie Shy PGY1 Pharmacy Resident 01/05/2023,7:44 PM

## 2023-01-05 NOTE — ED Notes (Signed)
Rt notified that VBG sent to lab 2108

## 2023-01-06 ENCOUNTER — Encounter: Payer: Self-pay | Admitting: Internal Medicine

## 2023-01-06 ENCOUNTER — Inpatient Hospital Stay
Admit: 2023-01-06 | Discharge: 2023-01-06 | Disposition: A | Discharge status: Home / Self Care | Payer: Medicare Other | Attending: Internal Medicine | Admitting: Internal Medicine

## 2023-01-06 DIAGNOSIS — N12 Tubulo-interstitial nephritis, not specified as acute or chronic: Secondary | ICD-10-CM

## 2023-01-06 DIAGNOSIS — E1122 Type 2 diabetes mellitus with diabetic chronic kidney disease: Secondary | ICD-10-CM

## 2023-01-06 DIAGNOSIS — I4891 Unspecified atrial fibrillation: Secondary | ICD-10-CM

## 2023-01-06 DIAGNOSIS — A419 Sepsis, unspecified organism: Secondary | ICD-10-CM

## 2023-01-06 DIAGNOSIS — N186 End stage renal disease: Secondary | ICD-10-CM

## 2023-01-06 DIAGNOSIS — N189 Chronic kidney disease, unspecified: Secondary | ICD-10-CM | POA: Diagnosis not present

## 2023-01-06 DIAGNOSIS — I480 Paroxysmal atrial fibrillation: Secondary | ICD-10-CM | POA: Diagnosis present

## 2023-01-06 DIAGNOSIS — Z992 Dependence on renal dialysis: Secondary | ICD-10-CM

## 2023-01-06 DIAGNOSIS — N1330 Unspecified hydronephrosis: Secondary | ICD-10-CM

## 2023-01-06 DIAGNOSIS — R6520 Severe sepsis without septic shock: Secondary | ICD-10-CM

## 2023-01-06 DIAGNOSIS — B9620 Unspecified Escherichia coli [E. coli] as the cause of diseases classified elsewhere: Secondary | ICD-10-CM

## 2023-01-06 DIAGNOSIS — D631 Anemia in chronic kidney disease: Secondary | ICD-10-CM

## 2023-01-06 LAB — BODY FLUID CELL COUNT WITH DIFFERENTIAL: Total Nucleated Cell Count, Fluid: 4 mm3 (ref 0–1000)

## 2023-01-06 LAB — PROTIME-INR
INR: 1.3 — ABNORMAL HIGH (ref 0.8–1.2)
Prothrombin Time: 16.7 s — ABNORMAL HIGH (ref 11.4–15.2)

## 2023-01-06 LAB — CBC
HCT: 28.1 % — ABNORMAL LOW (ref 39.0–52.0)
Hemoglobin: 9.4 g/dL — ABNORMAL LOW (ref 13.0–17.0)
MCH: 32 pg (ref 26.0–34.0)
MCHC: 33.5 g/dL (ref 30.0–36.0)
MCV: 95.6 fL (ref 80.0–100.0)
Platelets: 119 10*3/uL — ABNORMAL LOW (ref 150–400)
RBC: 2.94 MIL/uL — ABNORMAL LOW (ref 4.22–5.81)
RDW: 13.9 % (ref 11.5–15.5)
WBC: 27.8 10*3/uL — ABNORMAL HIGH (ref 4.0–10.5)
nRBC: 0 % (ref 0.0–0.2)

## 2023-01-06 LAB — TYPE AND SCREEN
ABO/RH(D): A NEG
Antibody Screen: NEGATIVE

## 2023-01-06 LAB — HEPATITIS B SURFACE ANTIGEN: Hepatitis B Surface Ag: NONREACTIVE

## 2023-01-06 LAB — MRSA NEXT GEN BY PCR, NASAL: MRSA by PCR Next Gen: NOT DETECTED

## 2023-01-06 LAB — CBG MONITORING, ED
Glucose-Capillary: 105 mg/dL — ABNORMAL HIGH (ref 70–99)
Glucose-Capillary: 119 mg/dL — ABNORMAL HIGH (ref 70–99)
Glucose-Capillary: 58 mg/dL — ABNORMAL LOW (ref 70–99)
Glucose-Capillary: 59 mg/dL — ABNORMAL LOW (ref 70–99)
Glucose-Capillary: 76 mg/dL (ref 70–99)
Glucose-Capillary: 88 mg/dL (ref 70–99)

## 2023-01-06 LAB — LACTIC ACID, PLASMA: Lactic Acid, Venous: 3 mmol/L (ref 0.5–1.9)

## 2023-01-06 LAB — HEPARIN LEVEL (UNFRACTIONATED)
Heparin Unfractionated: 0.52 [IU]/mL (ref 0.30–0.70)
Heparin Unfractionated: 0.53 [IU]/mL (ref 0.30–0.70)

## 2023-01-06 LAB — CORTISOL-AM, BLOOD: Cortisol - AM: 39.9 ug/dL — ABNORMAL HIGH (ref 6.7–22.6)

## 2023-01-06 LAB — HEMOGLOBIN A1C
Hgb A1c MFr Bld: 7.5 % — ABNORMAL HIGH (ref 4.8–5.6)
Mean Plasma Glucose: 168.55 mg/dL

## 2023-01-06 LAB — PROCALCITONIN: Procalcitonin: 74.73 ng/mL

## 2023-01-06 LAB — MAGNESIUM: Magnesium: 1.8 mg/dL (ref 1.7–2.4)

## 2023-01-06 MED ORDER — GENTAMICIN SULFATE 0.1 % EX CREA
1.0000 | TOPICAL_CREAM | Freq: Every day | CUTANEOUS | Status: DC
  Administered 2023-01-07 – 2023-01-08 (×2): 1 via TOPICAL
  Filled 2023-01-06 (×3): qty 15

## 2023-01-06 MED ORDER — VANCOMYCIN VARIABLE DOSE PER UNSTABLE RENAL FUNCTION (PHARMACIST DOSING): Status: DC

## 2023-01-06 MED ORDER — SODIUM CHLORIDE 0.9 % IV SOLN
1.0000 g | INTRAVENOUS | Status: DC
  Administered 2023-01-06: 1 g via INTRAVENOUS
  Filled 2023-01-06: qty 10

## 2023-01-06 MED ORDER — MELATONIN 5 MG PO TABS
2.5000 mg | ORAL_TABLET | Freq: Every day | ORAL | Status: DC
  Administered 2023-01-06 – 2023-01-08 (×4): 2.5 mg via ORAL
  Filled 2023-01-06 (×4): qty 1

## 2023-01-06 MED ORDER — SODIUM CHLORIDE 0.9 % IV BOLUS (SEPSIS)
500.0000 mL | Freq: Once | INTRAVENOUS | Status: AC
  Administered 2023-01-06: 500 mL via INTRAVENOUS

## 2023-01-06 MED ORDER — DELFLEX-LC/1.5% DEXTROSE 344 MOSM/L IP SOLN
INTRAPERITONEAL | Status: DC
  Filled 2023-01-06 (×4): qty 3000

## 2023-01-06 MED ORDER — TRAMADOL HCL 50 MG PO TABS
50.0000 mg | ORAL_TABLET | Freq: Two times a day (BID) | ORAL | Status: DC | PRN
  Administered 2023-01-06 – 2023-01-08 (×2): 50 mg via ORAL
  Filled 2023-01-06 (×2): qty 1

## 2023-01-06 MED ORDER — MAGNESIUM SULFATE 2 GM/50ML IV SOLN
2.0000 g | Freq: Once | INTRAVENOUS | Status: AC
  Administered 2023-01-06: 2 g via INTRAVENOUS
  Filled 2023-01-06: qty 50

## 2023-01-06 MED ORDER — DELFLEX-LC/2.5% DEXTROSE 394 MOSM/L IP SOLN
INTRAPERITONEAL | Status: DC
  Filled 2023-01-06 (×4): qty 3000

## 2023-01-06 MED ORDER — ONDANSETRON HCL 4 MG/2ML IJ SOLN
4.0000 mg | Freq: Four times a day (QID) | INTRAMUSCULAR | Status: DC | PRN
  Administered 2023-01-06 – 2023-01-07 (×3): 4 mg via INTRAVENOUS
  Filled 2023-01-06 (×3): qty 2

## 2023-01-06 NOTE — Assessment & Plan Note (Addendum)
2 g replaced in the emergency room will follow levels.

## 2023-01-06 NOTE — Assessment & Plan Note (Addendum)
Patient currently continued on heparin gtt.

## 2023-01-06 NOTE — Progress Notes (Signed)
Pharmacy Antibiotic Note  Tyler Wiley is a 77 y.o. male w/ ESRD on PD x 6 days/wk at home, admitted on 01/05/2023 with sepsis.  Pharmacy has been consulted for Cefepime & Vancomycin dosing.  Plan: Cefepime 1 gm q24hr per indication & ESRD on PD.  Pt given Vancomycin 2000 mg once. Variable vancomycin dosing by pharmacy d/t current renal fxn and pending PD schedule/restart.  Pharmacy will continue to follow and will adjust abx dosing whenever warranted.  Temp (24hrs), Avg:98.8 F (37.1 C), Min:98.3 F (36.8 C), Max:99.7 F (37.6 C)   Recent Labs  Lab 01/05/23 1535 01/05/23 1637 01/05/23 2005 01/05/23 2344  WBC 13.0*  --   --   --   CREATININE 10.27*  --   --   --   LATICACIDVEN 5.9* 5.1* 2.3* 3.0*    Estimated Creatinine Clearance: 7.2 mL/min (A) (by C-G formula based on SCr of 10.27 mg/dL (H)).    Allergies  Allergen Reactions   Cefuroxime     Other: Mouth ulcers, tongue swollen.  Tolerated Ancef.    Antimicrobials this admission: 10/01 Cefepime >>  10/01 Flagyl >>  10/01 Vancomycin >>   Microbiology results: 10/01 BCx: Pending 10/01 UCx: Pending  10/01 BodyCx (Peritoneal Dialysate): Pending  Thank you for allowing pharmacy to be a part of this patient's care.  Otelia Sergeant, PharmD, MBA 01/06/2023 2:13 AM

## 2023-01-06 NOTE — Assessment & Plan Note (Addendum)
Cpap per home settings.

## 2023-01-06 NOTE — Assessment & Plan Note (Signed)
Nephrology on board and plan to hold PD tonight to prevent further hypotension.

## 2023-01-06 NOTE — Progress Notes (Signed)
[facility]  - [place] [facility]   PATIENT NAME: Tyler Wiley    MR#:  [id number]  DATE OF BIRTH:  1945-06-25  SUBJECTIVE:  patient seen earlier. Wife at bedside. Complains of some nausea. Had vomiting earlier. Uncomfortable in bed requesting Tylenol for back pain. No fever.    VITALS:  Blood pressure 121/62, pulse 79, temperature 98.3 F (36.8 C), temperature source Oral, resp. rate (!) 26, height 5\' 11"  (1.803 m), weight 97.1 kg, SpO2 96%.  PHYSICAL EXAMINATION:   GENERAL:  77 y.o.-year-old patient with no acute distress.  LUNGS: Normal breath sounds bilaterally, no wheezing CARDIOVASCULAR: S1, S2 normal. No murmur   ABDOMEN: Soft, nontender, nondistended. PD cath+ EXTREMITIES: No  edema b/l.    NEUROLOGIC: nonfocal  patient is alert and awake SKIN: No obvious rash, lesion, or ulcer.   LABORATORY PANEL:  CBC Recent Labs  Lab 01/06/23 0518  WBC 27.8*  HGB 9.4*  HCT 28.1*  PLT 119*    Chemistries  Recent Labs  Lab 01/05/23 1535 01/05/23 2004 01/06/23 1334  NA 138  --   --   K 4.4  --   --   CL 99  --   --   CO2 19*  --   --   GLUCOSE 198*  --   --   BUN 77*  --   --   CREATININE 10.27*  --   --   CALCIUM 9.0  --   --   MG  --    < > 1.8  AST 18  --   --   ALT 18  --   --   ALKPHOS 116  --   --   BILITOT 0.9  --   --    < > = values in this interval not displayed.   RADIOLOGY:  CT ABDOMEN PELVIS WO CONTRAST  Result Date: 01/05/2023 CLINICAL DATA:  Abdominal pain EXAM: CT ABDOMEN AND PELVIS WITHOUT CONTRAST TECHNIQUE: Multidetector CT imaging of the abdomen and pelvis was performed following the standard protocol without IV contrast. RADIATION DOSE REDUCTION: This exam was performed according to the departmental dose-optimization program which includes automated exposure control, adjustment of the mA and/or kV according to patient size and/or use of iterative reconstruction technique. COMPARISON:  CT abdomen and pelvis dated October 17, 2012  FINDINGS: Lower chest: No acute abnormality. Hepatobiliary: No focal liver abnormality is seen. Status post cholecystectomy. No biliary dilatation. Pancreas: Unremarkable. No pancreatic ductal dilatation or surrounding inflammatory changes. Spleen: Normal in size without focal abnormality. Adrenals/Urinary Tract: Bilateral adrenal glands are unremarkable. Mild right hydronephrosis and hydroureter with asymmetric right perinephric stranding. No evidence of obstructing stone. Several cystic lesions of the right kidney are seen which are technically indeterminate, possibly due to streak artifact. Reference mildly hyperattenuating lesion of the interpolar region measuring 3.5 cm. Additional bilateral renal lesions are seen which are consistent with simple cysts. Bladder is unremarkable. Stomach/Bowel: Stomach is within normal limits. Appendix appears normal. No evidence of bowel wall thickening, distention, or inflammatory changes. Vascular/Lymphatic: Aortic atherosclerosis. No enlarged abdominal or pelvic lymph nodes. Reproductive: Fiducial markers noted in the prostate. Other: Peritoneal dialysis catheter and trace abdominal ascites. No intra-abdominal free air. Musculoskeletal: Diffuse osseous sclerosis, likely sequela of chronic renal disease. IMPRESSION: 1. Mild right hydronephrosis and hydroureter with asymmetric right perinephric stranding. No evidence of obstructing stone. Findings may be due to recently passed stone or ascending infection. Recommend correlation with urinalysis. 2. Several cystic lesions of the right kidney  are seen which are technically indeterminate, possibly due to streak artifact. Recommend further evaluation with nonemergent renal ultrasound. 3. Peritoneal dialysis catheter and trace abdominal ascites. 4. Aortic Atherosclerosis (ICD10-I70.0). Electronically Signed   By: Allegra Lai M.D.   On: 01/05/2023 19:07    Assessment and Plan Tyler Wiley is a 77 year old male with past  medical conditions including hypertension, stroke, diabetes, dyslipidemia, cholecystectomy, and end-stage renal disease on peritoneal dialysis.  Patient presents to the emergency department complaining of weakness, nausea and vomiting   Patient reports he was recently diagnosed with UTI and has been prescribed couple different antibiotics by PCP. He still makes urine at home.  CT abdomen pelvis shows non obstructive hydronephrosis.  UA appears cloudy with leukocytes and bacteria.  Lactic acid has improved to 3.0.(5.1)   Severe sepsis due to recurrent UTI/pyelonephritis right side -- patient came in with elevated lactic acid,  elevated white count, tachycardia abnormal UA and abnormal CT scan -- outpatient urine culture September 19 showed E. Coli -- this is patient's third episode of UTI. ID consultation with Dr. Joylene Draft -- PD fluid negative for bacteria. Few WBC. Patient denies abdominal pain -- received IV fluids. 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  >50% time spent on counselling and  coordination of care  Note: This dictation was prepared with Dragon dictation along with smaller phrase technology. Any transcriptional errors that result from this process are unintentional.  Enedina Finner M.D    [facility]   CC: Primary care physician; Lauro Regulus, MD

## 2023-01-06 NOTE — Consult Note (Addendum)
PHARMACY CONSULT NOTE - ELECTROLYTES  Pharmacy Consult for Electrolyte Monitoring and Replacement   Recent Labs: Height: 5\' 11"  (180.3 cm) Weight: 97.1 kg (213 lb 15.7 oz) IBW/kg (Calculated) : 75.3 Estimated Creatinine Clearance: 7.2 mL/min (A) (by C-G formula based on SCr of 10.27 mg/dL (H)). Potassium (mmol/L)  Date Value  01/05/2023 4.4  10/17/2012 4.2   Magnesium (mg/dL)  Date Value  60/45/4098 1.3 (L)  10/17/2012 1.9   Calcium (mg/dL)  Date Value  11/91/4782 9.0   Calcium, Total (mg/dL)  Date Value  95/62/1308 9.2   Albumin (g/dL)  Date Value  65/78/4696 3.9  10/17/2012 3.5   Sodium (mmol/L)  Date Value  01/05/2023 138  10/17/2012 135 (L)   Corrected Ca: 9.1 mg/dL  Assessment  Tyler Wiley is a 77 y.o. male presenting with UTI and N/V. PMH significant for ESRD on PD, HTN, HLD, OSA, and CVA. Pharmacy has been consulted to monitor and replace electrolytes.  Diet: renal diet MIVF: LR @ 50 mL/hr   Goal of Therapy: Electrolytes WNL  Plan:  Mg 1.3 10/1 @ 2004, Patient received MagSulf 2g IV x 1 10/2 @ 0149 Repeat Mg level 10/2 @ 1300 = 1.8, will give additional MagSulf 2 g IV x 1 Check BMP, Mg, Phos with AM labs  Thank you for allowing pharmacy to be a part of this patient's care.  Barrie Folk, PharmD Clinical Pharmacist 01/06/2023 9:56 AM

## 2023-01-06 NOTE — Assessment & Plan Note (Addendum)
Pt presenting with new a.fib rvr.  CHA2DS2/VAS Stroke Risk Points  Current as of 8 minutes ago     6 >= 2 Points: High Risk  1 to 1.99 Points: Medium Risk  0 Points: Low Risk    No Change     Details    This score determines the patient's risk of having a stroke if the  patient has atrial fibrillation.   Points Metrics  0 Has Congestive Heart Failure:  No    Current as of 8 minutes ago  0 Has Vascular Disease:  No    Current as of 8 minutes ago  1 Has Hypertension:  Yes     Current as of 8 minutes ago  2 Age:  77    Current as of 8 minutes ago  1 Has Diabetes:  Yes     Current as of 8 minutes ago  2 Had Stroke:  Yes  Had TIA:  Yes  Had Thromboembolism:  No    Current as of 8 minutes ago  0 Male:  No    Current as of 8 minutes ago   Pt started on heparin gtt due to high risk.

## 2023-01-06 NOTE — Assessment & Plan Note (Addendum)
Vitals:   01/05/23 1544 01/05/23 1603 01/05/23 1754 01/05/23 1846  BP: 122/62 (!) 86/43 (!) 96/59 99/77   01/05/23 1900 01/05/23 2323 01/05/23 2330 01/06/23 0000  BP: 107/63 (!) 115/47 112/84 (!) 106/48   01/06/23 0200  BP: (!) 140/70   2/2 to pyelonephritis. Cont broad spectrum VI abx with vancomycin./cefepime and flagyl . Follow PDF analysis and blood cultures.

## 2023-01-06 NOTE — Assessment & Plan Note (Addendum)
Type/ screen.  Follow cbc.  Transfuse as deemed appropriate.

## 2023-01-06 NOTE — Progress Notes (Signed)
   Peritoneal Dialysis Treatment Initiation Note     Pre Treatment Weight: 107.6 kg   Consent signed and in chart.  PD treatment initiated via aseptic technique.    Patient is awake and alert. No complaints of pain.    PD exit site clean, dry and intact.  Pt requested dressing be changed in the morning.   Hand-off given to the patient's nurse.  Education provided to dept staff  regarding PD machine and how  to contact tech support if machine  alarms.      [facility]

## 2023-01-06 NOTE — ED Notes (Signed)
Switched out pt's bed for an inpatient bed. Pt family at bedside denies needs at this time.

## 2023-01-06 NOTE — Progress Notes (Signed)
[facility]  ROUNDING NOTE   Subjective:   Tyler Wiley is a 77 year old male with past medical conditions including hypertension, stroke, diabetes, dyslipidemia, cholecystectomy, and end-stage renal disease on peritoneal dialysis.  Patient presents to the emergency department complaining of weakness, nausea and vomiting.  Patient has been admitted for Nausea and vomiting [R11.2] Severe sepsis (HCC) [A41.9, R65.20]  Patient is known to our practice and receives peritoneal dialysis management through the [facility] clinic, supervised by Dr. Thedore Mins.  Patient seen resting on stretcher, wife at bedside.  Patient reports no recent missed treatments.  No complications with dialysis.  States he was recently diagnosed with a UTI and has been prescribed a couple different antibiotics.  He was taken off of Cipro and prescribed a different antibiotic that initiated these concerns.  Patient reports dizziness, weakness, and nausea and vomiting.  Labs on ED arrival significant for lactic acid 5.9.  CT abdomen pelvis shows non obstructive hydronephrosis.  UA appears cloudy with leukocytes and bacteria.  Lactic acid has improved to 3.0.  We have been consulted to manage peritoneal dialysis needs.   Objective:  Vital signs in last 24 hours:  Temp:  [98.3 F (36.8 C)-99.7 F (37.6 C)] 98.3 F (36.8 C) (10/02 0936) Pulse Rate:  [31-122] 79 (10/02 1500) Resp:  [18-30] 26 (10/02 1500) BP: (86-141)/(43-84) 121/62 (10/02 1500) SpO2:  [92 %-96 %] 96 % (10/02 1500) Weight:  [97.1 kg] 97.1 kg (10/01 1953)  Weight change:  Filed Weights   01/05/23 1953  Weight: 97.1 kg    Intake/Output: I/O last 3 completed shifts: In: 4038.1 [I.V.:1000; IV Piggyback:3038.1] Out: -    Intake/Output this shift:  Total I/O In: 1100 [I.V.:600; IV Piggyback:500] Out: -   Physical Exam: General: NAD  Head: Normocephalic, atraumatic. Moist oral mucosal membranes  Eyes: Anicteric  Lungs:  Clear to  auscultation, normal effort  Heart: Regular rate and rhythm  Abdomen:  Soft, nontender, mild distention  Extremities: No peripheral edema.  Neurologic: Alert and oriented, moving all four extremities  Skin: No lesions  Access: PD catheter    Basic Metabolic Panel: Recent Labs  Lab 01/05/23 1535 01/05/23 2004 01/06/23 1334  NA 138  --   --   K 4.4  --   --   CL 99  --   --   CO2 19*  --   --   GLUCOSE 198*  --   --   BUN 77*  --   --   CREATININE 10.27*  --   --   CALCIUM 9.0  --   --   MG  --  1.3* 1.8    Liver Function Tests: Recent Labs  Lab 01/05/23 1535  AST 18  ALT 18  ALKPHOS 116  BILITOT 0.9  PROT 7.9  ALBUMIN 3.9   Recent Labs  Lab 01/05/23 1535  LIPASE 48   No results for input(s): "AMMONIA" in the last 168 hours.  CBC: Recent Labs  Lab 01/05/23 1535 01/06/23 0518  WBC 13.0* 27.8*  HGB 12.1* 9.4*  HCT 36.9* 28.1*  MCV 95.8 95.6  PLT 181 119*    Cardiac Enzymes: No results for input(s): "CKTOTAL", "CKMB", "CKMBINDEX", "TROPONINI" in the last 168 hours.  BNP: Invalid input(s): "POCBNP"  CBG: Recent Labs  Lab 01/06/23 0034 01/06/23 0731 01/06/23 0817 01/06/23 1215  GLUCAP 119* 58* 76 88    Microbiology: Results for orders placed or performed during the hospital encounter of 01/05/23  Blood Culture (routine x  2)     Status: None (Preliminary result)   Collection Time: 01/05/23  4:37 PM   Specimen: BLOOD  Result Value Ref Range Status   Specimen Description BLOOD BLOOD RIGHT FOREARM  Final   Special Requests   Final    BOTTLES DRAWN AEROBIC AND ANAEROBIC Blood Culture adequate volume   Culture   Final    NO GROWTH < 24 HOURS Performed at [facility], [address] [place], [place] [address]    Report Status PENDING  Incomplete  Blood Culture (routine x 2)     Status: None (Preliminary result)   Collection Time: 01/05/23  4:37 PM   Specimen: BLOOD  Result Value Ref Range Status   Specimen Description BLOOD LEFT  ANTECUBITAL  Final   Special Requests   Final    BOTTLES DRAWN AEROBIC AND ANAEROBIC Blood Culture adequate volume   Culture   Final    NO GROWTH < 24 HOURS Performed at [facility], [address], [place], [place] [address]    Report Status PENDING  Incomplete  Body fluid culture w Gram Stain     Status: None (Preliminary result)   Collection Time: 01/05/23  6:55 PM   Specimen: Peritoneal Dialysate; Body Fluid  Result Value Ref Range Status   Specimen Description   Final    PERITONEAL DIALYSATE Performed at [facility], [address] [place], [place] [address]    Special Requests   Final    Immunocompromised Performed at [facility], [address] [place], [place] [address]    Gram Stain   Final    RARE WBC PRESENT, PREDOMINANTLY MONONUCLEAR NO ORGANISMS SEEN    Culture   Final    NO GROWTH < 12 HOURS Performed at [facility], [address], [place], [place] [address]    Report Status PENDING  Incomplete  MRSA Next Gen by PCR, Nasal     Status: None   Collection Time: 01/06/23  8:00 AM   Specimen: Nasal Mucosa; Nasal Swab  Result Value Ref Range Status   MRSA by PCR Next Gen NOT DETECTED NOT DETECTED Final    Comment: (NOTE) The GeneXpert MRSA Assay (FDA approved for NASAL specimens only), is one component of a comprehensive MRSA colonization surveillance program. It is not intended to diagnose MRSA infection nor to guide or monitor treatment for MRSA infections. Test performance is not FDA approved in patients less than 36 years old. Performed at [facility], [address] [place], [place] [address]     Coagulation Studies: Recent Labs    01/06/23 0518  LABPROT 16.7*  INR 1.3*    Urinalysis: Recent Labs    01/05/23 2224  COLORURINE YELLOW*  LABSPEC 1.015  PHURINE 5.0  GLUCOSEU 50*  HGBUR SMALL*  BILIRUBINUR NEGATIVE  KETONESUR NEGATIVE  PROTEINUR 100*  NITRITE NEGATIVE  LEUKOCYTESUR LARGE*       Imaging: CT ABDOMEN PELVIS WO CONTRAST  Result Date: 01/05/2023 CLINICAL DATA:  Abdominal pain EXAM: CT ABDOMEN AND PELVIS WITHOUT CONTRAST TECHNIQUE: Multidetector CT imaging of the abdomen and pelvis was performed following the standard protocol without IV contrast. RADIATION DOSE REDUCTION: This exam was performed according to the departmental dose-optimization program which includes automated exposure control, adjustment of the mA and/or kV according to patient size and/or use of iterative reconstruction technique. COMPARISON:  CT abdomen and pelvis dated October 17, 2012 FINDINGS: Lower chest: No acute abnormality. Hepatobiliary: No focal liver abnormality is seen.  Status post cholecystectomy. No biliary dilatation. Pancreas: Unremarkable. No pancreatic ductal dilatation or surrounding inflammatory changes. Spleen: Normal in size without focal abnormality. Adrenals/Urinary Tract: Bilateral adrenal glands are unremarkable. Mild right hydronephrosis and hydroureter with asymmetric right perinephric stranding. No evidence of obstructing stone. Several cystic lesions of the right kidney are seen which are technically indeterminate, possibly due to streak artifact. Reference mildly hyperattenuating lesion of the interpolar region measuring 3.5 cm. Additional bilateral renal lesions are seen which are consistent with simple cysts. Bladder is unremarkable. Stomach/Bowel: Stomach is within normal limits. Appendix appears normal. No evidence of bowel wall thickening, distention, or inflammatory changes. Vascular/Lymphatic: Aortic atherosclerosis. No enlarged abdominal or pelvic lymph nodes. Reproductive: Fiducial markers noted in the prostate. Other: Peritoneal dialysis catheter and trace abdominal ascites. No intra-abdominal free air. Musculoskeletal: Diffuse osseous sclerosis, likely sequela of chronic renal disease. IMPRESSION: 1. Mild right hydronephrosis and hydroureter with asymmetric right perinephric  stranding. No evidence of obstructing stone. Findings may be due to recently passed stone or ascending infection. Recommend correlation with urinalysis. 2. Several cystic lesions of the right kidney are seen which are technically indeterminate, possibly due to streak artifact. Recommend further evaluation with nonemergent renal ultrasound. 3. Peritoneal dialysis catheter and trace abdominal ascites. 4. Aortic Atherosclerosis (ICD10-I70.0). Electronically Signed   By: Allegra Lai M.D.   On: 01/05/2023 19:07     Medications:    ceFEPime (MAXIPIME) IV     dialysis solution 1.5% low-MG/low-CA     dialysis solution 2.5% low-MG/low-CA     heparin 1,300 Units/hr (01/06/23 1218)   magnesium sulfate bolus IVPB 2 g (01/06/23 1505)   metronidazole Stopped (01/06/23 1032)    atorvastatin  40 mg Oral q1800   clopidogrel  75 mg Oral QPM   gentamicin cream  1 Application Topical Daily   insulin aspart  0-6 Units Subcutaneous TID WC   melatonin  2.5 mg Oral QHS   pantoprazole (PROTONIX) IV  40 mg Intravenous Q12H   sodium chloride flush  3 mL Intravenous Q12H   vancomycin variable dose per unstable renal function (pharmacist dosing)   Does not apply See admin instructions   acetaminophen **OR** acetaminophen, albuterol, ondansetron (ZOFRAN) IV, sodium chloride flush, traMADol  Assessment/ Plan:  Tyler Wiley is a 77 y.o.  male with past medical conditions including hypertension, stroke, diabetes, dyslipidemia, cholecystectomy, and end-stage renal disease on peritoneal dialysis.  Patient presents to the emergency department complaining of weakness, nausea and vomiting.  Patient has been admitted for Nausea and vomiting [R11.2] Severe sepsis (HCC) [A41.9, R65.20]   End-stage renal disease on peritoneal dialysis.  Patient completes nightly dialysis 6 nights a week.  No recently missed treatments.  Will continue nightly treatments per outpatient schedule.    2.  Sepsis, suspected secondary to  pyelonephritis.  Receiving IV Cipro and Septra.  Continue supportive care.  Clinical exam appears less likely peritonitis.  Awaiting cultures.  3. Anemia of chronic kidney disease Lab Results  Component Value Date   HGB 9.4 (L) 01/06/2023    Hemoglobin within optimal range.  Will consider ESA if needed.  4.  Hypertension with chronic kidney disease.  Home regimen includes amlodipine, carvedilol, and torsemide.  Due to soft blood pressure, will hold torsemide for now.  Amlodipine and carvedilol also held.  5. Secondary Hyperparathyroidism: with outpatient labs: PTH 765, phosphorus 5.8, calcium 8.9 on 12/09/22.   Lab Results  Component Value Date   CALCIUM 9.0 01/05/2023    Will continue to monitor  bone minerals during this admission.   LOS: 1 Dakai Braithwaite 10/2/20243:22 PM

## 2023-01-06 NOTE — Assessment & Plan Note (Addendum)
Iv ppi.   

## 2023-01-06 NOTE — Assessment & Plan Note (Addendum)
Home regimen of amlodipine  and torsemide to be held and start lopressor or diltiazem.

## 2023-01-06 NOTE — Assessment & Plan Note (Addendum)
Glycemic protocol.  Hold insulin and Amaryl/ tradjenta / [place].

## 2023-01-06 NOTE — Assessment & Plan Note (Addendum)
Replace and follow levels.  Pharmacy consult.

## 2023-01-06 NOTE — Assessment & Plan Note (Addendum)
Continue cefepime/ flagyl and vancomycin.  Urinalysis    Component Value Date/Time   COLORURINE YELLOW (A) 01/05/2023 2224   APPEARANCEUR CLOUDY (A) 01/05/2023 2224   APPEARANCEUR Clear 10/29/2012 0244   LABSPEC 1.015 01/05/2023 2224   LABSPEC 1.008 10/29/2012 0244   PHURINE 5.0 01/05/2023 2224   GLUCOSEU 50 (A) 01/05/2023 2224   GLUCOSEU 50 mg/dL 16/01/9603 5409   HGBUR SMALL (A) 01/05/2023 2224   BILIRUBINUR NEGATIVE 01/05/2023 2224   BILIRUBINUR Negative 10/29/2012 0244   KETONESUR NEGATIVE 01/05/2023 2224   PROTEINUR 100 (A) 01/05/2023 2224   NITRITE NEGATIVE 01/05/2023 2224   LEUKOCYTESUR LARGE (A) 01/05/2023 2224   LEUKOCYTESUR Negative 10/29/2012 0244  LR at 50 cc/hour.   Intake/Output Summary (Last 24 hours) at 01/06/2023 0242 Last data filed at 01/06/2023 0206 Gross per 24 hour  Intake 3500 ml  Output --  Net 3500 ml

## 2023-01-06 NOTE — Consult Note (Signed)
NAME: Tyler Wiley  DOB: [birth year]  MRN: [id number]  Date/Time: 01/06/2023 5:57 PM  REQUESTING PROVIDER: Dr.Patel Subjective:  REASON FOR CONSULT: Sepsis ? Tyler Wiley is a 77 y.o. with a history of DM, ESRD, ON PD,HTN,OSA, Presented to his PCP on 9/30 with malaise, dizziness and weakness and feeling off balance. He had just increased ozempic and was feeling worse. A urine analysis had 85 wbc and a urine culture sent . WBC was 13.2, HB 10.8 HE was placed on Doxy and cipro on 01/05/23 Pt seems to be getting repeated urine culture  for abnormal UA and it has showed ecoli since Jan 2024. Pt says he has had some urinary symptoms like discomfort and lower abdominal pain. Now has some pain rt flank- No hematuria Has urolift surgery before HE came to the ED on 01/05/23 with worsening weakness, dizziness and off balance In the ED found to have low BP , was in afib  01/05/23 16:03  BP 86/43 (L)  Temp 98.4 F (36.9 C)  Pulse Rate 121 !  Resp 20  SpO2 95 %    Latest Reference Range & Units 01/05/23 15:35  WBC 4.0 - 10.5 K/uL 13.0 (H)  Hemoglobin 13.0 - 17.0 g/dL 28.4 (L)  HCT 13.2 - 44.0 % 36.9 (L)  Platelets 150 - 400 K/uL 181  Creatinine 0.61 - 1.24 mg/dL 10.27 (H)  Blood culture sent CT abdomen showed mild rt hydronephrosis, hydroureter and asymmetric perinephric stranding, several cystic lesions in rt kidney seen Started on vanco/cefepime and flagyl Peritoneal dialysate fluid only 4 cells  Past Medical History:  Diagnosis Date   Basal cell carcinoma 10/13/2021   L chin - tx with ED&C   CKD (chronic kidney disease) stage 4, GFR 15-29 ml/min (HCC)    Peritoneal dialysis   Diabetes mellitus without complication (HCC)    Gastritis    Hypertension    Renal disorder    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   L nose - LN2, 5FU/Calcipotriene cr   Stroke [place] [facility])     Past Surgical History:  Procedure Laterality Date   CHOLECYSTECTOMY     CYSTOSCOPY WITH INSERTION OF UROLIFT N/A  01/20/2019   Procedure: CYSTOSCOPY WITH INSERTION OF UROLIFT;  Surgeon: Malen Gauze, MD;  Location: [facility];  Service: Urology;  Laterality: N/A;  30 MINS    Social History   Socioeconomic History   Marital status: Married    Spouse name: Not on file   Number of children: Not on file   Years of education: Not on file   Highest education level: Not on file  Occupational History   Not on file  Tobacco Use   Smoking status: Former    Types: Cigarettes   Smokeless tobacco: Never  Substance and Sexual Activity   Alcohol use: Yes    Comment: rare   Drug use: Never   Sexual activity: Yes  Other Topics Concern   Not on file  Social History Narrative   Not on file   Social Determinants of Health   Financial Resource Strain: Low Risk  (12/10/2022)   Received from [facility]   Overall Financial Resource Strain (CARDIA)    Difficulty of Paying Living Expenses: Not hard at all  Food Insecurity: No Food Insecurity (01/06/2023)   Hunger Vital Sign    Worried About Running Out of Food in the Last Year: Never true    Ran Out of Food in the Last Year: Never true  Transportation Needs: No Transportation Needs (01/06/2023)   PRAPARE - Administrator, Civil Service (Medical): No    Lack of Transportation (Non-Medical): No  Physical Activity: Not on file  Stress: Not on file  Social Connections: Not on file  Intimate Partner Violence: Not At Risk (01/06/2023)   Humiliation, Afraid, Rape, and Kick questionnaire    Fear of Current or Ex-Partner: No    Emotionally Abused: No    Physically Abused: No    Sexually Abused: No    Family History  Problem Relation Age of Onset   CAD Father    Allergies  Allergen Reactions   Amlodipine Swelling   Cefuroxime     Other: Mouth ulcers, tongue swollen.  Tolerated Ancef.   Shrimp Extract     Sometimes breaks out in hives   I? Current Facility-Administered Medications  Medication Dose Route Frequency Provider  Last Rate Last Admin   acetaminophen (TYLENOL) tablet 650 mg  650 mg Oral Q6H PRN Gertha Calkin, MD   650 mg at 01/06/23 1333   Or   acetaminophen (TYLENOL) suppository 650 mg  650 mg Rectal Q6H PRN Gertha Calkin, MD       albuterol (PROVENTIL) (2.5 MG/3ML) 0.083% nebulizer solution 2.5 mg  2.5 mg Nebulization Q4H PRN Otelia Sergeant, RPH       atorvastatin (LIPITOR) tablet 40 mg  40 mg Oral q1800 Gertha Calkin, MD       ceFEPIme (MAXIPIME) 1 g in sodium chloride 0.9 % 100 mL IVPB  1 g Intravenous Q24H Belue, Lendon Collar, RPH       clopidogrel (PLAVIX) tablet 75 mg  75 mg Oral QPM Gertha Calkin, MD       dialysis solution 1.5% low-MG/low-CA dianeal solution   Intraperitoneal Q24H Wendee Beavers, NP       dialysis solution 2.5% low-MG/low-CA dianeal solution   Intraperitoneal Q24H Breeze, Shantelle, NP       gentamicin cream (GARAMYCIN) 0.1 % 1 Application  1 Application Topical Daily Breeze, Shantelle, NP       heparin ADULT infusion 100 units/mL (25000 units/210mL)  1,300 Units/hr Intravenous Continuous Effie Shy, RPH 13 mL/hr at 01/06/23 1218 1,300 Units/hr at 01/06/23 1218   insulin aspart (novoLOG) injection 0-6 Units  0-6 Units Subcutaneous TID WC Irena Cords V, MD       melatonin tablet 2.5 mg  2.5 mg Oral QHS Lindajo Royal V, MD   2.5 mg at 01/06/23 0232   metroNIDAZOLE (FLAGYL) IVPB 500 mg  500 mg Intravenous Q12H Gertha Calkin, MD   Stopped at 01/06/23 1032   ondansetron (ZOFRAN) injection 4 mg  4 mg Intravenous Q6H PRN Enedina Finner, MD   4 mg at 01/06/23 1102   pantoprazole (PROTONIX) injection 40 mg  40 mg Intravenous Q12H Irena Cords V, MD   40 mg at 01/06/23 0913   sodium chloride flush (NS) 0.9 % injection 3 mL  3 mL Intravenous Q12H Irena Cords V, MD   3 mL at 01/06/23 0916   sodium chloride flush (NS) 0.9 % injection 3 mL  3 mL Intravenous PRN Gertha Calkin, MD       traMADol Janean Sark) tablet 50 mg  50 mg Oral Q12H PRN Enedina Finner, MD       vancomycin variable dose per  unstable renal function (pharmacist dosing)   Does not apply See admin instructions Otelia Sergeant, [facility]       Current Outpatient  Medications  Medication Sig Dispense Refill   atorvastatin (LIPITOR) 40 MG tablet Take 1 tablet (40 mg total) by mouth daily at 6 PM. 30 tablet 0   carvedilol (COREG) 12.5 MG tablet Take 12.5 mg by mouth 2 (two) times daily with a meal.      clopidogrel (PLAVIX) 75 MG tablet Take 75 mg by mouth every evening.      clotrimazole-betamethasone (LOTRISONE) cream APPLY TO AFFECTED AREA TWICE A DAY 45 g 0   glimepiride (AMARYL) 2 MG tablet Take 2 mg by mouth daily with breakfast.     pantoprazole (PROTONIX) 40 MG tablet Take 40 mg by mouth daily.     torsemide (DEMADEX) 10 MG tablet Take 10 mg by mouth 2 (two) times daily.     traMADol (ULTRAM) 50 MG tablet Take 50 mg by mouth every 8 (eight) hours as needed for moderate pain.     albuterol (VENTOLIN HFA) 108 (90 Base) MCG/ACT inhaler Inhale 1-2 puffs into the lungs every 4 (four) hours as needed for wheezing or shortness of breath. (Patient not taking: Reported on 01/06/2023)     amLODipine (NORVASC) 10 MG tablet Take 1 tablet (10 mg total) by mouth daily. (Patient not taking: Reported on 01/06/2023) 30 tablet 0   ciprofloxacin (CIPRO) 500 MG tablet Take 500 mg by mouth 2 (two) times daily. (Patient not taking: Reported on 01/06/2023)     doxycycline (VIBRAMYCIN) 100 MG capsule Take 1 capsule by mouth 2 (two) times daily. (Patient not taking: Reported on 01/06/2023)     fluorouracil (EFUDEX) 5 % cream Apply topically 2 (two) times daily. Starting October the 1st apply to the L nose BID x 7 days. (Patient not taking: Reported on 01/06/2023) 30 g 0   fluticasone (FLONASE) 50 MCG/ACT nasal spray Place 2 sprays into both nostrils daily. (Patient taking differently: Place 2 sprays into both nostrils daily as needed for allergies. ) 16 g 0   ondansetron (ZOFRAN-ODT) 8 MG disintegrating tablet Take 1-2 tablets (8-16 mg total) by mouth  every 8 (eight) hours as needed for Nausea for up to 7 days     sitaGLIPtin (JANUVIA) 100 MG tablet Take 25 mg by mouth daily.      TRADJENTA 5 MG TABS tablet Take 5 mg by mouth daily. (Patient not taking: Reported on 01/06/2023)       Abtx:  Anti-infectives (From admission, onward)    Start     Dose/Rate Route Frequency Ordered Stop   01/06/23 1800  ceFEPIme (MAXIPIME) 1 g in sodium chloride 0.9 % 100 mL IVPB        1 g 200 mL/hr over 30 Minutes Intravenous Every 24 hours 01/06/23 0222     01/06/23 0208  vancomycin variable dose per unstable renal function (pharmacist dosing)         Does not apply See admin instructions 01/06/23 0208     01/05/23 2330  vancomycin (VANCOCIN) IVPB 1000 mg/200 mL premix        1,000 mg 200 mL/hr over 60 Minutes Intravenous  Once 01/05/23 2327 01/06/23 0206   01/05/23 2100  metroNIDAZOLE (FLAGYL) IVPB 500 mg        500 mg 100 mL/hr over 60 Minutes Intravenous Every 12 hours 01/05/23 2100     01/05/23 1730  ceFEPIme (MAXIPIME) 2 g in sodium chloride 0.9 % 100 mL IVPB        2 g 200 mL/hr over 30 Minutes Intravenous  Once 01/05/23 1716 01/05/23 1758  01/05/23 1730  metroNIDAZOLE (FLAGYL) IVPB 500 mg        500 mg 100 mL/hr over 60 Minutes Intravenous  Once 01/05/23 1716 01/05/23 1952   01/05/23 1730  vancomycin (VANCOCIN) IVPB 1000 mg/200 mL premix        1,000 mg 200 mL/hr over 60 Minutes Intravenous  Once 01/05/23 1716 01/05/23 2315       REVIEW OF SYSTEMS:  Const: negative fever, has  chills, 23 pound weight loss in 2-3 weeks Eyes: negative diplopia or visual changes, negative eye pain ENT: negative coryza, negative sore throat Resp: negative cough, hemoptysis, dyspnea Cards: negative for chest pain, palpitations, lower extremity edema GU: as above GI: lower abdominal pain,no  diarrhea, bleeding, constipation Skin: negative for rash and pruritus Heme: negative for easy bruising and gum/nose bleeding MS: weakness Neurolo: dizziness,    Psych: negative for feelings of anxiety, depression  Endocrine:  diabetes Allergy/Immunology- cefuroxime mouth ulcers Objective:  VITALS:  BP 121/62   Pulse 79   Temp 98.3 F (36.8 C) (Oral)   Resp (!) 26   Ht 5\' 11"  (1.803 m)   Wt 97.1 kg   SpO2 96%   BMI 29.84 kg/m   PHYSICAL EXAM:  General: Alert, cooperative, no distress, pale Head: Normocephalic, without obvious abnormality, atraumatic. Eyes: Conjunctivae clear, anicteric sclerae. Pupils are equal ENT Nares normal. No drainage or sinus tenderness. Lips, mucosa, and tongue normal. No Thrush Neck: Supple, symmetrical, no adenopathy, thyroid: non tender no carotid bruit and no JVD. Back: No CVA tenderness. Lungs: Clear to auscultation bilaterally. No Wheezing or Rhonchi. No rales. Heart: Regular rate and rhythm, no murmur, rub or gallop. Abdomen: Soft, PD cath in place Extremities: atraumatic, no cyanosis. No edema. No clubbing Skin: No rashes or lesions. Or bruising Lymph: Cervical, supraclavicular normal. Neurologic: Grossly non-focal Pertinent Labs Lab Results CBC    Component Value Date/Time   WBC 27.8 (H) 01/06/2023 0518   RBC 2.94 (L) 01/06/2023 0518   HGB 9.4 (L) 01/06/2023 0518   HGB 13.0 10/17/2012 1134   HCT 28.1 (L) 01/06/2023 0518   HCT 37.7 (L) 10/17/2012 1134   PLT 119 (L) 01/06/2023 0518   PLT 163 10/17/2012 1134   MCV 95.6 01/06/2023 0518   MCV 87 10/17/2012 1134   MCH 32.0 01/06/2023 0518   MCHC 33.5 01/06/2023 0518   RDW 13.9 01/06/2023 0518   RDW 13.8 10/17/2012 1134   LYMPHSABS 1.4 12/23/2018 0417   MONOABS 0.8 12/23/2018 0417   EOSABS 0.2 12/23/2018 0417   BASOSABS 0.0 12/23/2018 0417       Latest Ref Rng & Units 01/05/2023    3:35 PM 02/25/2019    7:23 AM 01/20/2019   10:00 AM  CMP  Glucose 70 - 99 mg/dL 244  010  272   BUN 8 - 23 mg/dL 77  41  36   Creatinine 0.61 - 1.24 mg/dL 53.66  4.40  3.47   Sodium 135 - 145 mmol/L 138  137  138   Potassium 3.5 - 5.1 mmol/L 4.4  4.4   4.1   Chloride 98 - 111 mmol/L 99  104  105   CO2 22 - 32 mmol/L 19  22  23    Calcium 8.9 - 10.3 mg/dL 9.0  8.4  8.4   Total Protein 6.5 - 8.1 g/dL 7.9     Total Bilirubin 0.3 - 1.2 mg/dL 0.9     Alkaline Phos 38 - 126 U/L 116     AST 15 -  41 U/L 18     ALT 0 - 44 U/L 18         Microbiology: Recent Results (from the past 240 hour(s))  Blood Culture (routine x 2)     Status: None (Preliminary result)   Collection Time: 01/05/23  4:37 PM   Specimen: BLOOD  Result Value Ref Range Status   Specimen Description BLOOD BLOOD RIGHT FOREARM  Final   Special Requests   Final    BOTTLES DRAWN AEROBIC AND ANAEROBIC Blood Culture adequate volume   Culture   Final    NO GROWTH < 24 HOURS Performed at [facility], [address] [place], [place] [address]    Report Status PENDING  Incomplete  Blood Culture (routine x 2)     Status: None (Preliminary result)   Collection Time: 01/05/23  4:37 PM   Specimen: BLOOD  Result Value Ref Range Status   Specimen Description BLOOD LEFT ANTECUBITAL  Final   Special Requests   Final    BOTTLES DRAWN AEROBIC AND ANAEROBIC Blood Culture adequate volume   Culture   Final    NO GROWTH < 24 HOURS Performed at [facility], [address], [place], [place] [address]    Report Status PENDING  Incomplete  Body fluid culture w Gram Stain     Status: None (Preliminary result)   Collection Time: 01/05/23  6:55 PM   Specimen: Peritoneal Dialysate; Body Fluid  Result Value Ref Range Status   Specimen Description   Final    PERITONEAL DIALYSATE Performed at [facility], [address] [place], [place] [address]    Special Requests   Final    Immunocompromised Performed at [facility], [address] [place], [place] [address]    Gram Stain   Final    RARE WBC PRESENT, PREDOMINANTLY MONONUCLEAR NO ORGANISMS SEEN    Culture   Final    NO GROWTH < 12 HOURS Performed at [facility], [address] [place], [place] [address]    Report Status PENDING  Incomplete  MRSA Next Gen by PCR, Nasal     Status: None   Collection Time: 01/06/23  8:00 AM   Specimen: Nasal Mucosa; Nasal Swab  Result Value Ref Range Status   MRSA by PCR Next Gen NOT DETECTED NOT DETECTED Final    Comment: (NOTE) The GeneXpert MRSA Assay (FDA approved for NASAL specimens only), is one component of a comprehensive MRSA colonization surveillance program. It is not intended to diagnose MRSA infection nor to guide or monitor treatment for MRSA infections. Test performance is not FDA approved in patients less than 52 years old. Performed at [facility], [address] [place], [place] [address]     IMAGING RESULTS: CT abdomen- mild rt hydronephrosis and hydroureter with asymmetric rt perinephric stranding I have personally reviewed the films ? Impression/Recommendation ? ?pt presenting with weakness, being off balance and chills, multiple symptomatic UTI in the past- Colonized with ecoli  Uro sepsis with rt hydronephrosis and hydroureter with perinephric stranding Complicated UTI Colonized with E.coli- which is a pretty sensitive one ( R bactrim, Amoxicillin I to levaquin) Blood and urine culture sent Currently on vanco , ceftriaxone and flagyl- just continue ceftriaxone R/o incomplete urinary emptying - post void bladder scan He makes around 600cc of urine a day  DM Was On ozempic caused nausea and he stopped it  with weight  ESRD on PD   ___________________________________________________ Discussed with  patient, wife and requesting provider Note:  This document was prepared using Dragon voice recognition software and may include unintentional dictation errors.

## 2023-01-06 NOTE — ED Notes (Signed)
CBG 59. Pt eating dinner tray.

## 2023-01-06 NOTE — Assessment & Plan Note (Addendum)
2/2 pyelonephritis. Pt coming with failed outpatient therapy with ciprofloxacin and septra.  Supportive care with antiemetics and IV fluids.

## 2023-01-06 NOTE — Consult Note (Signed)
ANTICOAGULATION CONSULT NOTE  Pharmacy Consult for Heparin Indication: atrial fibrillation  Allergies  Allergen Reactions   Cefuroxime     Other: Mouth ulcers, tongue swollen.  Tolerated Ancef.    Patient Measurements: Height: 5\' 11"  (180.3 cm) Weight: 97.1 kg (213 lb 15.7 oz) IBW/kg (Calculated) : 75.3 Heparin Dosing Weight: 95 kg  Vital Signs: Temp: 98.4 F (36.9 C) (10/02 0525) Temp Source: Oral (10/02 0525) BP: 124/71 (10/02 0300) Pulse Rate: 88 (10/02 0300)  Labs: Recent Labs    01/05/23 1535 01/06/23 0518  HGB 12.1* 9.4*  HCT 36.9* 28.1*  PLT 181 119*  LABPROT  --  16.7*  INR  --  1.3*  HEPARINUNFRC  --  0.52  CREATININE 10.27*  --     Estimated Creatinine Clearance: 7.2 mL/min (A) (by C-G formula based on SCr of 10.27 mg/dL (H)).   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 Pressures were low on arrival and found to be in new onset afib. Pharmacy has been consulted to dose heparin.   Goal of Therapy:  Heparin level 0.3-0.7 units/ml Monitor platelets by anticoagulation protocol: Yes   10/02 0518 HL 0.52, therapeutic x 1  Plan:  Continue heparin infusion at 1300 units/hr Will recheck HL in 8 hr to confirm  CBC daily while on heparin  Otelia Sergeant, PharmD, [facility] 01/06/2023 6:11 AM

## 2023-01-06 NOTE — Consult Note (Signed)
ANTICOAGULATION CONSULT NOTE  Pharmacy Consult for Heparin Indication: atrial fibrillation  Allergies  Allergen Reactions   Amlodipine Swelling   Cefuroxime     Other: Mouth ulcers, tongue swollen.  Tolerated Ancef.   Shrimp Extract     Sometimes breaks out in hives    Patient Measurements: Height: 5\' 11"  (180.3 cm) Weight: 97.1 kg (213 lb 15.7 oz) IBW/kg (Calculated) : 75.3 Heparin Dosing Weight: 95 kg  Vital Signs: Temp: 98.3 F (36.8 C) (10/02 0936) Temp Source: Oral (10/02 0936) BP: 141/71 (10/02 1230) Pulse Rate: 66 (10/02 1230)  Labs: Recent Labs    01/05/23 1535 01/06/23 0518 01/06/23 1334  HGB 12.1* 9.4*  --   HCT 36.9* 28.1*  --   PLT 181 119*  --   LABPROT  --  16.7*  --   INR  --  1.3*  --   HEPARINUNFRC  --  0.52 0.53  CREATININE 10.27*  --   --     Estimated Creatinine Clearance: 7.2 mL/min (A) (by C-G formula based on SCr of 10.27 mg/dL (H)).   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 Pressures were low on arrival and found to be in new onset afib. Pharmacy has been consulted to dose heparin.   Goal of Therapy:  Heparin level 0.3-0.7 units/ml Monitor platelets by anticoagulation protocol: Yes   10/02 0518 HL 0.52, therapeutic x 1 10/02 1334 HL 0.53, therapeutic x 2  Plan:  Continue heparin infusion at 1300 units/hr Will recheck HL with AM labs while therapeutic CBC daily while on  heparin  Barrie Folk, PharmD 01/06/2023 2:45 PM

## 2023-01-07 DIAGNOSIS — I4891 Unspecified atrial fibrillation: Secondary | ICD-10-CM | POA: Diagnosis not present

## 2023-01-07 DIAGNOSIS — A419 Sepsis, unspecified organism: Secondary | ICD-10-CM | POA: Diagnosis not present

## 2023-01-07 DIAGNOSIS — N189 Chronic kidney disease, unspecified: Secondary | ICD-10-CM | POA: Diagnosis not present

## 2023-01-07 DIAGNOSIS — N12 Tubulo-interstitial nephritis, not specified as acute or chronic: Secondary | ICD-10-CM | POA: Diagnosis not present

## 2023-01-07 DIAGNOSIS — N390 Urinary tract infection, site not specified: Secondary | ICD-10-CM

## 2023-01-07 LAB — BASIC METABOLIC PANEL
Anion gap: 13 (ref 5–15)
BUN: 75 mg/dL — ABNORMAL HIGH (ref 8–23)
CO2: 17 mmol/L — ABNORMAL LOW (ref 22–32)
Calcium: 8.3 mg/dL — ABNORMAL LOW (ref 8.9–10.3)
Chloride: 101 mmol/L (ref 98–111)
Creatinine, Ser: 9.65 mg/dL — ABNORMAL HIGH (ref 0.61–1.24)
GFR, Estimated: 5 mL/min — ABNORMAL LOW (ref >60)
Glucose, Bld: 148 mg/dL — ABNORMAL HIGH (ref 70–99)
Potassium: 3.5 mmol/L (ref 3.5–5.1)
Sodium: 132 mmol/L — ABNORMAL LOW (ref 135–145)

## 2023-01-07 LAB — ECHOCARDIOGRAM COMPLETE
Area-P 1/2: 2.84 cm2
Height: 71 in
S' Lateral: 3 cm
Weight: 3423.68 [oz_av]

## 2023-01-07 LAB — CBC
HCT: 24.2 % — ABNORMAL LOW (ref 39.0–52.0)
Hemoglobin: 8.1 g/dL — ABNORMAL LOW (ref 13.0–17.0)
MCH: 32 pg (ref 26.0–34.0)
MCHC: 33.5 g/dL (ref 30.0–36.0)
MCV: 95.7 fL (ref 80.0–100.0)
Platelets: 80 10*3/uL — ABNORMAL LOW (ref 150–400)
RBC: 2.53 MIL/uL — ABNORMAL LOW (ref 4.22–5.81)
RDW: 14.1 % (ref 11.5–15.5)
WBC: 16.8 10*3/uL — ABNORMAL HIGH (ref 4.0–10.5)
nRBC: 0 % (ref 0.0–0.2)

## 2023-01-07 LAB — GLUCOSE, CAPILLARY
Glucose-Capillary: 121 mg/dL — ABNORMAL HIGH (ref 70–99)
Glucose-Capillary: 173 mg/dL — ABNORMAL HIGH (ref 70–99)
Glucose-Capillary: 208 mg/dL — ABNORMAL HIGH (ref 70–99)

## 2023-01-07 LAB — HEPATITIS B SURFACE ANTIBODY, QUANTITATIVE: Hep B S AB Quant (Post): 4.3 m[IU]/mL — ABNORMAL LOW

## 2023-01-07 LAB — PHOSPHORUS: Phosphorus: 6.6 mg/dL — ABNORMAL HIGH (ref 2.5–4.6)

## 2023-01-07 LAB — HEPARIN LEVEL (UNFRACTIONATED): Heparin Unfractionated: 0.44 [IU]/mL (ref 0.30–0.70)

## 2023-01-07 LAB — VANCOMYCIN, RANDOM: Vancomycin Rm: 12 ug/mL

## 2023-01-07 LAB — MAGNESIUM: Magnesium: 1.9 mg/dL (ref 1.7–2.4)

## 2023-01-07 LAB — CBG MONITORING, ED: Glucose-Capillary: 140 mg/dL — ABNORMAL HIGH (ref 70–99)

## 2023-01-07 MED ORDER — METOPROLOL SUCCINATE ER 25 MG PO TB24
25.0000 mg | ORAL_TABLET | Freq: Every day | ORAL | Status: DC
  Administered 2023-01-08: 25 mg via ORAL
  Filled 2023-01-07 (×2): qty 1

## 2023-01-07 MED ORDER — PANTOPRAZOLE SODIUM 40 MG PO TBEC
40.0000 mg | DELAYED_RELEASE_TABLET | Freq: Every day | ORAL | Status: DC
  Administered 2023-01-07 – 2023-01-09 (×3): 40 mg via ORAL
  Filled 2023-01-07 (×3): qty 1

## 2023-01-07 MED ORDER — SODIUM CHLORIDE 0.9 % IV SOLN
2.0000 g | INTRAVENOUS | Status: DC
  Administered 2023-01-07 – 2023-01-09 (×3): 2 g via INTRAVENOUS
  Filled 2023-01-07 (×5): qty 20

## 2023-01-07 MED ORDER — POTASSIUM CHLORIDE CRYS ER 20 MEQ PO TBCR
20.0000 meq | EXTENDED_RELEASE_TABLET | Freq: Once | ORAL | Status: AC
  Administered 2023-01-07: 20 meq via ORAL
  Filled 2023-01-07: qty 1

## 2023-01-07 MED ORDER — SODIUM CHLORIDE 0.9 % IV SOLN
12.5000 mg | Freq: Three times a day (TID) | INTRAVENOUS | Status: DC | PRN
  Administered 2023-01-07: 12.5 mg via INTRAVENOUS
  Filled 2023-01-07: qty 12.5

## 2023-01-07 NOTE — Consult Note (Signed)
[facility] CARDIOLOGY CONSULT NOTE       Patient ID: Tyler Wiley MRN: [id number] DOB/AGE: Feb 28, 1946 77 y.o.  Admit date: 01/05/2023 Referring Physician Dr. Enedina Finner Primary Physician Dr. Einar Crow Primary Cardiologist Dr. Gwen Pounds (last seen 2020) Reason for Consultation atrial fibrillation  HPI: Tyler Wiley is a 77 y.o. male  with a past medical history of hypertension, diabetes, dyslipidemia, carotid artery stenosis, hx CVA, type II diabetes, OSA on CPAP, end-stage renal disease on peritoneal dialysis  who presented to the ED on 01/05/2023 for nausea/vomiting. Reportedly was in atrial fibrillation RVR in the ED. Cardiology was consulted for further evaluation.   Patient had been treated for UTI recently by PCP but noticed that symptoms not improving.  Yesterday he had an episode of significant nausea and vomiting as well as generalized weakness and decided to come to the ED for evaluation.  Noted to be hypotensive in the ED. Also found to be in atrial fibrillation on EKG with rates in the 100s.  Initial workup in the ED notable for creatinine 10.27 (ESRD on PD), potassium 4.4, lactic acid 5.9, WBC 13.0, hemoglobin 12.1.  He was initially treated for urosepsis.  Antibiotics initiated and blood pressure improved with IV fluids.  He received IV diltiazem for his atrial fibrillation which controlled his rate.  There was concern that he was maintaining in atrial fibrillation which is a new diagnosis for him and thus cardiology was consulted.  At the time of my evaluation this afternoon patient is resting comfortably in hospital bed with wife present at bedside.  His recent illness was discussed in detail.  He reports that once in a while he has episodes of palpitations.  States that he has been told in the past that he has an irregular heart rhythm and wore a monitor many years ago.  Heart rate is much better controlled at this time and he denies any issues with palpitations.  Does  also report intermittent episodes of dizziness for the last few months which are not associated with palpitations.  He denies any chest pain or shortness of breath.  Review of systems complete and found to be negative unless listed above    Past Medical History:  Diagnosis Date   Basal cell carcinoma 10/13/2021   L chin - tx with ED&C   CKD (chronic kidney disease) stage 4, GFR 15-29 ml/min (HCC)    Peritoneal dialysis   Diabetes mellitus without complication (HCC)    Gastritis    Hypertension    Renal disorder    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   L nose - LN2, 5FU/Calcipotriene cr   Stroke [facility])     Past Surgical History:  Procedure Laterality Date   CHOLECYSTECTOMY     CYSTOSCOPY WITH INSERTION OF UROLIFT N/A 01/20/2019   Procedure: CYSTOSCOPY WITH INSERTION OF UROLIFT;  Surgeon: Malen Gauze, MD;  Location: [facility] ORS;  Service: Urology;  Laterality: N/A;  30 MINS    Medications Prior to Admission  Medication Sig Dispense Refill Last Dose   atorvastatin (LIPITOR) 40 MG tablet Take 1 tablet (40 mg total) by mouth daily at 6 PM. 30 tablet 0 Past Week   carvedilol (COREG) 12.5 MG tablet Take 12.5 mg by mouth 2 (two) times daily with a meal.    01/05/2023   clopidogrel (PLAVIX) 75 MG tablet Take 75 mg by mouth every evening.    Past Week   clotrimazole-betamethasone (LOTRISONE) cream APPLY TO AFFECTED AREA TWICE A DAY  45 g 0 prn at unknown   glimepiride (AMARYL) 2 MG tablet Take 2 mg by mouth daily with breakfast.   01/05/2023   pantoprazole (PROTONIX) 40 MG tablet Take 40 mg by mouth daily.   01/05/2023   torsemide (DEMADEX) 10 MG tablet Take 10 mg by mouth 2 (two) times daily.   Past Week   traMADol (ULTRAM) 50 MG tablet Take 50 mg by mouth every 8 (eight) hours as needed for moderate pain.      albuterol (VENTOLIN HFA) 108 (90 Base) MCG/ACT inhaler Inhale 1-2 puffs into the lungs every 4 (four) hours as needed for wheezing or shortness of breath. (Patient not taking:  Reported on 01/06/2023)   Not Taking   amLODipine (NORVASC) 10 MG tablet Take 1 tablet (10 mg total) by mouth daily. (Patient not taking: Reported on 01/06/2023) 30 tablet 0 Not Taking   ciprofloxacin (CIPRO) 500 MG tablet Take 500 mg by mouth 2 (two) times daily. (Patient not taking: Reported on 01/06/2023)   Not Taking   doxycycline (VIBRAMYCIN) 100 MG capsule Take 1 capsule by mouth 2 (two) times daily. (Patient not taking: Reported on 01/06/2023)   Not Taking   fluorouracil (EFUDEX) 5 % cream Apply topically 2 (two) times daily. Starting October the 1st apply to the L nose BID x 7 days. (Patient not taking: Reported on 01/06/2023) 30 g 0 Not Taking   fluticasone (FLONASE) 50 MCG/ACT nasal spray Place 2 sprays into both nostrils daily. (Patient taking differently: Place 2 sprays into both nostrils daily as needed for allergies. ) 16 g 0    ondansetron (ZOFRAN-ODT) 8 MG disintegrating tablet Take 1-2 tablets (8-16 mg total) by mouth every 8 (eight) hours as needed for Nausea for up to 7 days      sitaGLIPtin (JANUVIA) 100 MG tablet Take 25 mg by mouth daily.       TRADJENTA 5 MG TABS tablet Take 5 mg by mouth daily. (Patient not taking: Reported on 01/06/2023)   Not Taking   Social History   Socioeconomic History   Marital status: Married    Spouse name: Not on file   Number of children: Not on file   Years of education: Not on file   Highest education level: Not on file  Occupational History   Not on file  Tobacco Use   Smoking status: Former    Types: Cigarettes   Smokeless tobacco: Never  Substance and Sexual Activity   Alcohol use: Yes    Comment: rare   Drug use: Never   Sexual activity: Yes  Other Topics Concern   Not on file  Social History Narrative   Not on file   Social Determinants of Health   Financial Resource Strain: Low Risk  (12/10/2022)   Received from [facility]   Overall Financial Resource Strain (CARDIA)    Difficulty of Paying Living Expenses:  Not hard at all  Food Insecurity: No Food Insecurity (01/06/2023)   Hunger Vital Sign    Worried About Running Out of Food in the Last Year: Never true    Ran Out of Food in the Last Year: Never true  Transportation Needs: No Transportation Needs (01/06/2023)   PRAPARE - Administrator, Civil Service (Medical): No    Lack of Transportation (Non-Medical): No  Physical Activity: Not on file  Stress: Not on file  Social Connections: Not on file  Intimate Partner Violence: Not At Risk (01/06/2023)   Humiliation, Afraid, Rape,  and Kick questionnaire    Fear of Current or Ex-Partner: No    Emotionally Abused: No    Physically Abused: No    Sexually Abused: No    Family History  Problem Relation Age of Onset   CAD Father      Vitals:   01/07/23 0700 01/07/23 0734 01/07/23 0923 01/07/23 0950  BP: (!) 116/55  (!) 144/62 134/69  Pulse: 65  68 80  Resp: (!) 21  20 18   Temp:  98.1 F (36.7 C) 98.1 F (36.7 C) 98.4 F (36.9 C)  TempSrc:  Oral Oral Oral  SpO2: 94%  99% 97%  Weight:      Height:        PHYSICAL EXAM General: Well appearing, well nourished, in no acute distress sitting upright in hospital bed with wife present at bedside. HEENT: Normocephalic and atraumatic. Neck: No JVD.  Lungs: Normal respiratory effort on room air. Clear bilaterally to auscultation. No wheezes, crackles, rhonchi.  Heart: HRRR. Normal S1 and S2 without gallops or murmurs.  Abdomen: Non-distended appearing.  Msk: Normal strength and tone for age. Extremities: Warm and well perfused. No clubbing, cyanosis. No edema.  Neuro: Alert and oriented X 3. Psych: Answers questions appropriately.   Labs: Basic Metabolic Panel: Recent Labs    01/05/23 1535 01/05/23 2004 01/06/23 1334 01/07/23 0445  NA 138  --   --  132*  K 4.4  --   --  3.5  CL 99  --   --  101  CO2 19*  --   --  17*  GLUCOSE 198*  --   --  148*  BUN 77*  --   --  75*  CREATININE 10.27*  --   --  9.65*  CALCIUM 9.0  --    --  8.3*  MG  --    < > 1.8 1.9  PHOS  --   --   --  6.6*   < > = values in this interval not displayed.   Liver Function Tests: Recent Labs    01/05/23 1535  AST 18  ALT 18  ALKPHOS 116  BILITOT 0.9  PROT 7.9  ALBUMIN 3.9   Recent Labs    01/05/23 1535  LIPASE 48   CBC: Recent Labs    01/06/23 0518 01/07/23 0445  WBC 27.8* 16.8*  HGB 9.4* 8.1*  HCT 28.1* 24.2*  MCV 95.6 95.7  PLT 119* 80*   Cardiac Enzymes: No results for input(s): "CKTOTAL", "CKMB", "CKMBINDEX", "TROPONINIHS" in the last 72 hours. BNP: No results for input(s): "BNP" in the last 72 hours. D-Dimer: No results for input(s): "DDIMER" in the last 72 hours. Hemoglobin A1C: Recent Labs    01/05/23 2224  HGBA1C 7.5*   Fasting Lipid Panel: No results for input(s): "CHOL", "HDL", "LDLCALC", "TRIG", "CHOLHDL", "LDLDIRECT" in the last 72 hours. Thyroid Function Tests: No results for input(s): "TSH", "T4TOTAL", "T3FREE", "THYROIDAB" in the last 72 hours.  Invalid input(s): "FREET3" Anemia Panel: No results for input(s): "VITAMINB12", "FOLATE", "FERRITIN", "TIBC", "IRON", "RETICCTPCT" in the last 72 hours.   Radiology: CT ABDOMEN PELVIS WO CONTRAST  Result Date: 01/05/2023 CLINICAL DATA:  Abdominal pain EXAM: CT ABDOMEN AND PELVIS WITHOUT CONTRAST TECHNIQUE: Multidetector CT imaging of the abdomen and pelvis was performed following the standard protocol without IV contrast. RADIATION DOSE REDUCTION: This exam was performed according to the departmental dose-optimization program which includes automated exposure control, adjustment of the mA and/or kV according to patient size and/or use  of iterative reconstruction technique. COMPARISON:  CT abdomen and pelvis dated October 17, 2012 FINDINGS: Lower chest: No acute abnormality. Hepatobiliary: No focal liver abnormality is seen. Status post cholecystectomy. No biliary dilatation. Pancreas: Unremarkable. No pancreatic ductal dilatation or surrounding inflammatory  changes. Spleen: Normal in size without focal abnormality. Adrenals/Urinary Tract: Bilateral adrenal glands are unremarkable. Mild right hydronephrosis and hydroureter with asymmetric right perinephric stranding. No evidence of obstructing stone. Several cystic lesions of the right kidney are seen which are technically indeterminate, possibly due to streak artifact. Reference mildly hyperattenuating lesion of the interpolar region measuring 3.5 cm. Additional bilateral renal lesions are seen which are consistent with simple cysts. Bladder is unremarkable. Stomach/Bowel: Stomach is within normal limits. Appendix appears normal. No evidence of bowel wall thickening, distention, or inflammatory changes. Vascular/Lymphatic: Aortic atherosclerosis. No enlarged abdominal or pelvic lymph nodes. Reproductive: Fiducial markers noted in the prostate. Other: Peritoneal dialysis catheter and trace abdominal ascites. No intra-abdominal free air. Musculoskeletal: Diffuse osseous sclerosis, likely sequela of chronic renal disease. IMPRESSION: 1. Mild right hydronephrosis and hydroureter with asymmetric right perinephric stranding. No evidence of obstructing stone. Findings may be due to recently passed stone or ascending infection. Recommend correlation with urinalysis. 2. Several cystic lesions of the right kidney are seen which are technically indeterminate, possibly due to streak artifact. Recommend further evaluation with nonemergent renal ultrasound. 3. Peritoneal dialysis catheter and trace abdominal ascites. 4. Aortic Atherosclerosis (ICD10-I70.0). Electronically Signed   By: Allegra Lai M.D.   On: 01/05/2023 19:07    ECHO pending  TELEMETRY reviewed by me 01/07/2023: NSR PACs rate 60s  EKG reviewed by me: atrial fibrillation rate 106 bpm  Data reviewed by me 01/07/2023: last 24h vitals tele labs imaging I/O ED provider note, admission H&P, hospitalist progress note  Principal Problem:   Nausea with  vomiting Active Problems:   Gastritis   HTN (hypertension)   OSA (obstructive sleep apnea)   Type 2 diabetes mellitus with chronic kidney disease on chronic dialysis, without long-term current use of insulin (HCC)   Severe sepsis (HCC)   Electrolyte abnormality   Anemia of chronic kidney failure, unspecified stage   Pyelonephritis   Paroxysmal atrial fibrillation (HCC)   ESRD needing dialysis (HCC)   Hx of completed stroke   Hypomagnesemia   Atrial fibrillation (HCC)    ASSESSMENT AND PLAN:  Tyler Wiley is a 77 y.o. male  with a past medical history of hypertension, diabetes, dyslipidemia, carotid artery stenosis, hx CVA, type II diabetes, OSA on CPAP, end-stage renal disease on peritoneal dialysis  who presented to the ED on 01/05/2023 for nausea/vomiting. Reportedly was in atrial fibrillation RVR in the ED. Cardiology was consulted for further evaluation.   # New onset atrial fibrillation # Hx CVA # Hypertension Patient presented in atrial fibrillation in the setting of urosepsis. S/p IV diltiazem in the ED with improvement in rate. Was started on heparin in the ED but developed thrombocytopenia so this was discontinued. Now in NSR with PACs on telemetry.  -Start metoprolol succinate 25 mg daily for rate control.  -Recommend eliquis 5 mg twice daily for stroke risk reduction once thrombocytopenia improved. Discontinue plavix when starting eliquis.  -Echo pending.   # UTI # Sepsis # ESRD on PD Patient with recurrent UTI, presenting with sepsis now being treated with IV antibiotics.  -Management per primary  Cardiology will sign off. Please haiku with questions or re-engage if needed.  Plan for follow up in 4 weeks in clinic with Dr.  Alluri.   This patient's plan of care was discussed and created with Dr. Corky Sing and he is in agreement.  Signed: Gale Journey, PA-C  01/07/2023, 1:20 PM [facility]

## 2023-01-07 NOTE — Progress Notes (Signed)
  Peritoneal Dialysis Treatment disconnect Note       Consent signed and in chart.  PD treatment disconnected via aseptic technique.    Patient is awake and alert. No complaints of pain.    PD exit site clean, dry and intact.     Hand-off given to the patient's nurse.       Lynann Beaver LPN [facility]

## 2023-01-07 NOTE — Plan of Care (Signed)

## 2023-01-07 NOTE — Consult Note (Signed)
ANTICOAGULATION CONSULT NOTE  Pharmacy Consult for Heparin Indication: atrial fibrillation  Allergies  Allergen Reactions   Amlodipine Swelling   Cefuroxime     Other: Mouth ulcers, tongue swollen.  Tolerated Ancef.   Shrimp Extract     Sometimes breaks out in hives    Patient Measurements: Height: 5\' 11"  (180.3 cm) Weight: 107.6 kg (237 lb 3.4 oz) IBW/kg (Calculated) : 75.3 Heparin Dosing Weight: 95 kg  Vital Signs: Temp: 98.1 F (36.7 C) (10/03 0342) Temp Source: Axillary (10/03 0342) BP: 138/67 (10/03 0500) Pulse Rate: 68 (10/03 0500)  Labs: Recent Labs    01/05/23 1535 01/06/23 0518 01/06/23 1334 01/07/23 0445  HGB 12.1* 9.4*  --  8.1*  HCT 36.9* 28.1*  --  24.2*  PLT 181 119*  --  80*  LABPROT  --  16.7*  --   --   INR  --  1.3*  --   --   HEPARINUNFRC  --  0.52 0.53 0.44  CREATININE 10.27*  --   --   --     Estimated Creatinine Clearance: 7.5 mL/min (A) (by C-G formula based on SCr of 10.27 mg/dL (H)).   Medical History: Past Medical History:  Diagnosis Date   Basal cell carcinoma 10/13/2021   L chin - tx with ED&C   CKD (chronic kidney disease) stage 4, GFR 15-29 ml/min (HCC)    Peritoneal dialysis   Diabetes mellitus without complication (HCC)    Gastritis    Hypertension    Renal disorder    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   L nose - LN2, 5FU/Calcipotriene cr   Stroke (HCC)     Medications:  Clopidogrel 75 mg (fill 12/14/2022 x 90 day supply)  Assessment: Philopater Mucha is a 77 yo male who presented to the ED with nausea, vomiting, light headedness and generally not feeling well. Pressures were low on arrival and found to be in new onset afib. Pharmacy has been consulted to dose heparin.   Goal of Therapy:  Heparin level 0.3-0.7 units/ml Monitor platelets by anticoagulation protocol: Yes   10/02 0518 HL 0.52, therapeutic x 1 10/02 1334 HL 0.53, therapeutic x 2 10/03 0445 HL 0.44, therapeutic x 3  Plan:  Continue heparin  infusion at 1300 units/hr Will recheck HL with AM labs daily while therapeutic CBC daily while on heparin  Otelia Sergeant, PharmD, [facility] 01/07/2023 5:59 AM

## 2023-01-07 NOTE — Consult Note (Signed)
PHARMACY CONSULT NOTE - ELECTROLYTES  Pharmacy Consult for Electrolyte Monitoring and Replacement   Recent Labs: Height: 5\' 11"  (180.3 cm) Weight: 107.6 kg (237 lb 3.4 oz) IBW/kg (Calculated) : 75.3 Estimated Creatinine Clearance: 8 mL/min (A) (by C-G formula based on SCr of 9.65 mg/dL (H)). Potassium (mmol/L)  Date Value  01/07/2023 3.5  10/17/2012 4.2   Magnesium (mg/dL)  Date Value  36/62/9476 1.9  10/17/2012 1.9   Calcium (mg/dL)  Date Value  54/65/0354 8.3 (L)   Calcium, Total (mg/dL)  Date Value  65/68/1275 9.2   Albumin (g/dL)  Date Value  17/00/1749 3.9  10/17/2012 3.5   Phosphorus (mg/dL)  Date Value  44/96/7591 6.6 (H)   Sodium (mmol/L)  Date Value  01/07/2023 132 (L)  10/17/2012 135 (L)   Corrected Ca: 8.4 mg/dL  Assessment  Tyler Wiley is a 77 y.o. male presenting with UTI and N/V. PMH significant for ESRD on PD, HTN, HLD, OSA, and CVA. Pharmacy has been consulted to monitor and replace electrolytes.  Diet: renal diet MIVF: LR @ 50 mL/hr   Goal of Therapy: Electrolytes WNL  Plan:  K = 3.5, give Kcl 20 mEq po x 1 Check BMP and Mg with AM labs  Thank you for allowing pharmacy to be a part of this patient's care.  Barrie Folk, PharmD Clinical Pharmacist 01/07/2023 9:38 AM

## 2023-01-07 NOTE — Progress Notes (Signed)
Date of Admission:  01/05/2023      ID: Tyler Wiley is a 77 y.o. male  Principal Problem:   Nausea with vomiting Active Problems:   Gastritis   HTN (hypertension)   OSA (obstructive sleep apnea)   Type 2 diabetes mellitus with chronic kidney disease on chronic dialysis, without long-term current use of insulin (HCC)   Severe sepsis (HCC)   Electrolyte abnormality   Anemia of chronic kidney failure, unspecified stage   Pyelonephritis   Paroxysmal atrial fibrillation (HCC)   ESRD needing dialysis (HCC)   Hx of completed stroke   Hypomagnesemia   Atrial fibrillation (HCC)    Subjective: Pt still feels bad Nasueous Dizzy Flank pain is slightly better PD machine not working well  Medications:   atorvastatin  40 mg Oral q1800   gentamicin cream  1 Application Topical Daily   insulin aspart  0-6 Units Subcutaneous TID WC   melatonin  2.5 mg Oral QHS   metoprolol succinate  25 mg Oral Daily   pantoprazole  40 mg Oral Daily   sodium chloride flush  3 mL Intravenous Q12H    Objective: Vital signs in last 24 hours: Patient Vitals for the past 24 hrs:  BP Temp Temp src Pulse Resp SpO2 Weight  01/07/23 1600 (!) 150/84 98.1 F (36.7 C) Oral 76 20 95 % --  01/07/23 0950 134/69 98.4 F (36.9 C) Oral 80 18 97 % --  01/07/23 0923 (!) 144/62 98.1 F (36.7 C) Oral 68 20 99 % --  01/07/23 0734 -- 98.1 F (36.7 C) Oral -- -- -- --  01/07/23 0700 (!) 116/55 -- -- 65 (!) 21 94 % --  01/07/23 0656 -- -- -- 73 18 93 % --  01/07/23 0600 117/64 -- -- 64 16 97 % --  01/07/23 0500 138/67 -- -- 68 (!) 22 94 % --  01/07/23 0400 101/60 -- -- 61 17 92 % --  01/07/23 0342 (!) 128/53 98.1 F (36.7 C) Axillary 72 (!) 24 93 % --  01/07/23 0138 -- 98 F (36.7 C) Oral 89 (!) 21 95 % --  01/07/23 0120 (!) 146/63 -- -- 77 (!) 22 97 % --  01/06/23 2349 -- 98.1 F (36.7 C) Oral -- -- -- --  01/06/23 2330 (!) 114/53 -- -- 64 (!) 23 94 % --  01/06/23 2300 109/60 -- -- 84 (!) 21 93 % --   01/06/23 2230 126/74 -- -- 88 (!) 28 95 % --  01/06/23 2030 (!) 132/50 98 F (36.7 C) Oral 75 (!) 25 94 % 107.6 kg      PHYSICAL EXAM:  General: Alert, cooperative, no distress, appears stated age.  Head: Normocephalic, without obvious abnormality, atraumatic. Eyes: Conjunctivae clear, anicteric sclerae. Pupils are equal ENT Nares normal. No drainage or sinus tenderness. Lips, mucosa, and tongue normal. No Thrush Neck: Supple, symmetrical, no adenopathy, thyroid: non tender no carotid bruit and no JVD. Back: No CVA tenderness. Lungs: Clear to auscultation bilaterally. No Wheezing or Rhonchi. No rales. Heart: Regular rate and rhythm, no murmur, rub or gallop. Abdomen: PD cath Extremities: atraumatic, no cyanosis. No edema. No clubbing Skin: No rashes or lesions. Or bruising Lymph: Cervical, supraclavicular normal. Neurologic: Grossly non-focal  Lab Results    Latest Ref Rng & Units 01/07/2023    4:45 AM 01/06/2023    5:18 AM 01/05/2023    3:35 PM  CBC  WBC 4.0 - 10.5 K/uL 16.8  27.8  13.0  Hemoglobin 13.0 - 17.0 g/dL 8.1  9.4  96.2   Hematocrit 39.0 - 52.0 % 24.2  28.1  36.9   Platelets 150 - 400 K/uL 80  119  181        Latest Ref Rng & Units 01/07/2023    4:45 AM 01/05/2023    3:35 PM 02/25/2019    7:23 AM  CMP  Glucose 70 - 99 mg/dL 952  841  324   BUN 8 - 23 mg/dL 75  77  41   Creatinine 0.61 - 1.24 mg/dL 4.01  02.72  5.36   Sodium 135 - 145 mmol/L 132  138  137   Potassium 3.5 - 5.1 mmol/L 3.5  4.4  4.4   Chloride 98 - 111 mmol/L 101  99  104   CO2 22 - 32 mmol/L 17  19  22    Calcium 8.9 - 10.3 mg/dL 8.3  9.0  8.4   Total Protein 6.5 - 8.1 g/dL  7.9    Total Bilirubin 0.3 - 1.2 mg/dL  0.9    Alkaline Phos 38 - 126 U/L  116    AST 15 - 41 U/L  18    ALT 0 - 44 U/L  18        Microbiology: BC- Ng UC -ecoli  Studies/Results: ECHOCARDIOGRAM COMPLETE  Result Date: 01/07/2023    ECHOCARDIOGRAM REPORT   Patient Name:   KACIE KRISTIANSEN Date of Exam: 01/06/2023  Medical Rec #:  [id number]     Height:       71.0 in Accession #:    [id number]    Weight:       214.0 lb Date of Birth:  [birth year]      BSA:          2.170 m Patient Age:    77 years      BP:           131/62 mmHg Patient Gender: M             HR:           92 bpm. Exam Location:  [facility] Procedure: 2D Echo, Cardiac Doppler and Color Doppler Indications:     I48.91 Atrial Fibrillation  History:         Patient has prior history of Echocardiogram examinations, most                  recent 05/07/2018. CAD, Stroke; Risk Factors:Hypertension and                  Diabetes.  Sonographer:     Daphine Deutscher RDCS Referring Phys:  [id number] EKTA V PATEL Diagnosing Phys: Mellody Drown Alluri IMPRESSIONS  1. Left ventricular ejection fraction, by estimation, is 60 to 65%. The left ventricle has normal function. The left ventricle has no regional wall motion abnormalities. There is mild left ventricular hypertrophy. Left ventricular diastolic parameters were normal.  2. Right ventricular systolic function is normal. The right ventricular size is normal.  3. The mitral valve is normal in structure. Trivial mitral valve regurgitation.  4. The aortic valve is tricuspid. There is mild thickening of the aortic valve. Aortic valve regurgitation is trivial.  5. Aortic dilatation noted. There is mild dilatation of the ascending aorta, measuring 44 mm.  6. The inferior vena cava is normal in size with greater than 50% respiratory variability, suggesting right atrial pressure of 3 mmHg. FINDINGS  Left Ventricle: Left ventricular ejection fraction, by estimation, is  60 to 65%. The left ventricle has normal function. The left ventricle has no regional wall motion abnormalities. The left ventricular internal cavity size was normal in size. There is  mild left ventricular hypertrophy. Left ventricular diastolic parameters were normal. Right Ventricle: The right ventricular size is normal. No increase in right ventricular wall thickness. Right  ventricular systolic function is normal. Left Atrium: Left atrial size was normal in size. Right Atrium: Right atrial size was normal in size. Pericardium: There is no evidence of pericardial effusion. Mitral Valve: The mitral valve is normal in structure. Trivial mitral valve regurgitation. Tricuspid Valve: The tricuspid valve is normal in structure. Tricuspid valve regurgitation is trivial. Aortic Valve: The aortic valve is tricuspid. There is mild thickening of the aortic valve. Aortic valve regurgitation is trivial. Pulmonic Valve: The pulmonic valve was not well visualized. Pulmonic valve regurgitation is not visualized. Aorta: Aortic dilatation noted. There is mild dilatation of the ascending aorta, measuring 44 mm. Venous: The inferior vena cava is normal in size with greater than 50% respiratory variability, suggesting right atrial pressure of 3 mmHg. IAS/Shunts: No atrial level shunt detected by color flow Doppler.  LEFT VENTRICLE PLAX 2D LVIDd:         5.00 cm   Diastology LVIDs:         3.00 cm   LV e' medial:    8.43 cm/s LV PW:         1.2 cm    LV E/e' medial:  8.4 LV IVS:        1.2 cm    LV e' lateral:   9.19 cm/s LVOT diam:     2.20 cm   LV E/e' lateral: 7.7 LV SV:         113 LV SV Index:   52 LVOT Area:     3.80 cm  RIGHT VENTRICLE             IVC RV Basal diam:  4.00 cm     IVC diam: 2.20 cm RV S prime:     22.30 cm/s TAPSE (M-mode): 2.3 cm LEFT ATRIUM             Index        RIGHT ATRIUM           Index LA diam:        4.90 cm 2.26 cm/m   RA Area:     14.20 cm LA Vol (A2C):   61.9 ml 28.53 ml/m  RA Volume:   37.50 ml  17.28 ml/m LA Vol (A4C):   34.3 ml 15.81 ml/m LA Biplane Vol: 48.8 ml 22.49 ml/m  AORTIC VALVE LVOT Vmax:   147.50 cm/s LVOT Vmean:  102.200 cm/s LVOT VTI:    0.296 m  AORTA Ao Root diam: 4.00 cm Ao Asc diam:  4.40 cm MITRAL VALVE MV Area (PHT): 2.84 cm    SHUNTS MV Decel Time: 268 msec    Systemic VTI:  0.30 m MV E velocity: 70.50 cm/s  Systemic Diam: 2.20 cm MV A  velocity: 93.60 cm/s MV E/A ratio:  0.75 Mellody Drown Alluri Electronically signed by Windell Norfolk Signature Date/Time: 01/07/2023/1:40:05 PM    Final      Assessment/Plan:  ?pt presenting with weakness, being off balance and chills, multiple symptomatic UTI in the past- Colonized with ecoli   Urosepsis with rt hydronephrosis and hydroureter with perinephric stranding Complicated UTI due to [facility] On ceftriaxone incomplete urinary emptying ruled out - post void bladder  scan only 29 He makes around 600cc of urine a day   Severe nausea and poor appeitie is unlikely to be due to infection- it has been chronic- wonder whether he needs to be on hemodialysis  DM Was On ozempic caused nausea and he stopped it  with weight   ESRD on PD No peritonitis  Discussed the management with the patient

## 2023-01-07 NOTE — ED Notes (Signed)
ED TO INPATIENT HANDOFF REPORT  ED Nurse Name and Phone #: Jen Mow [phone]  S Name/Age/Gender Tyler Wiley 77 y.o. male Room/Bed: 219A/219A-AA  Code Status   Code Status: Full Code  Home/SNF/Other Home Patient oriented to: self, place, time, and situation Is this baseline? Yes   Triage Complete: Triage complete  Chief Complaint Pyelonephritis [N12] Nausea and vomiting [R11.2] Severe sepsis (HCC) [A41.9, R65.20] Atrial fibrillation, unspecified type (HCC) [I48.91]  Triage Note Pt comes with c/o UTI. Pt also states he started vomiting this morning and again after lunch. Pt states meds for UTI. Pt denies any fevers.  Pt has had chills and no fevers. Pt has peritoneal dialysis at home 6 days week.   Allergies Allergies  Allergen Reactions   Amlodipine Swelling   Cefuroxime     Other: Mouth ulcers, tongue swollen.  Tolerated Ancef.   Shrimp Extract     Sometimes breaks out in hives    Level of Care/Admitting Diagnosis ED Disposition     ED Disposition  Admit   Condition  --   Comment  Hospital Area: [facility] [[id number]]  Level of Care: Telemetry Medical [104]  Covid Evaluation: Asymptomatic - no recent exposure (last 10 days) testing not required  Diagnosis: Severe sepsis [facility]) [[id number]]  Admitting Physician: Joselyn Glassman  Attending Physician: Joselyn Glassman  Certification:: I certify this patient will need inpatient services for at least 2 midnights          B Medical/Surgery History Past Medical History:  Diagnosis Date   Basal cell carcinoma 10/13/2021   L chin - tx with ED&C   CKD (chronic kidney disease) stage 4, GFR 15-29 ml/min (HCC)    Peritoneal dialysis   Diabetes mellitus without complication (HCC)    Gastritis    Hypertension    Renal disorder    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   L nose - LN2, 5FU/Calcipotriene cr   Stroke [facility])    Past Surgical History:  Procedure Laterality Date    CHOLECYSTECTOMY     CYSTOSCOPY WITH INSERTION OF UROLIFT N/A 01/20/2019   Procedure: CYSTOSCOPY WITH INSERTION OF UROLIFT;  Surgeon: Malen Gauze, MD;  Location: [facility];  Service: Urology;  Laterality: N/A;  30 MINS     A IV Location/Drains/Wounds Patient Lines/Drains/Airways Status     Active Line/Drains/Airways     Name Placement date Placement time Site Days   Peripheral IV 01/05/23 22 G Posterior;Right Forearm 01/05/23  1631  Forearm  2   Peripheral IV 01/05/23 22 G Left Antecubital 01/05/23  1636  Antecubital  2            Intake/Output Last 24 hours  Intake/Output Summary (Last 24 hours) at 01/07/2023 0955 Last data filed at 01/07/2023 0845 Gross per 24 hour  Intake 1850.21 ml  Output 2110 ml  Net -259.79 ml    Labs/Imaging Results for orders placed or performed during the hospital encounter of 01/05/23 (from the past 48 hour(s))  Lipase, blood     Status: None   Collection Time: 01/05/23  3:35 PM  Result Value Ref Range   Lipase 48 11 - 51 U/L    Comment: Performed at [facility], [address] [place], [place] [address]  Comprehensive metabolic panel     Status: Abnormal   Collection Time: 01/05/23  3:35 PM  Result Value Ref Range   Sodium 138 135 - 145 mmol/L    Comment: ELECTROLYTES REPEATED TO  VERIFY MU   Potassium 4.4 3.5 - 5.1 mmol/L   Chloride 99 98 - 111 mmol/L   CO2 19 (L) 22 - 32 mmol/L   Glucose, Bld 198 (H) 70 - 99 mg/dL    Comment: Glucose reference range applies only to samples taken after fasting for at least 8 hours.   BUN 77 (H) 8 - 23 mg/dL   Creatinine, Ser 16.10 (H) 0.61 - 1.24 mg/dL   Calcium 9.0 8.9 - 96.0 mg/dL   Total Protein 7.9 6.5 - 8.1 g/dL   Albumin 3.9 3.5 - 5.0 g/dL   AST 18 15 - 41 U/L   ALT 18 0 - 44 U/L   Alkaline Phosphatase 116 38 - 126 U/L   Total Bilirubin 0.9 0.3 - 1.2 mg/dL   GFR, Estimated 5 (L) >60 mL/min    Comment: (NOTE) Calculated using the CKD-EPI Creatinine Equation (2021)    Anion  gap 20 (H) 5 - 15    Comment: Performed at [facility], [address] [place], [place] [address]  CBC     Status: Abnormal   Collection Time: 01/05/23  3:35 PM  Result Value Ref Range   WBC 13.0 (H) 4.0 - 10.5 K/uL   RBC 3.85 (L) 4.22 - 5.81 MIL/uL   Hemoglobin 12.1 (L) 13.0 - 17.0 g/dL   HCT 81.1 (L) 91.4 - 78.2 %   MCV 95.8 80.0 - 100.0 fL   MCH 31.4 26.0 - 34.0 pg   MCHC 32.8 30.0 - 36.0 g/dL   RDW 95.6 21.3 - 08.6 %   Platelets 181 150 - 400 K/uL   nRBC 0.0 0.0 - 0.2 %    Comment: Performed at [facility], [address] [place], [place] [address]  Lactic acid, plasma     Status: Abnormal   Collection Time: 01/05/23  3:35 PM  Result Value Ref Range   Lactic Acid, Venous 5.9 (HH) 0.5 - 1.9 mmol/L    Comment: CRITICAL RESULT CALLED TO, READ BACK BY AND VERIFIED WITH ERIKA TRUJILLO 01/05/23 1641 MU Performed at [facility], [address] [place], [place] [address]   Beta-hydroxybutyric acid     Status: None   Collection Time: 01/05/23  3:35 PM  Result Value Ref Range   Beta-Hydroxybutyric Acid 0.15 0.05 - 0.27 mmol/L    Comment: Performed at [facility], [address] [place], [place] [address]  Lactic acid, plasma     Status: Abnormal   Collection Time: 01/05/23  4:37 PM  Result Value Ref Range   Lactic Acid, Venous 5.1 (HH) 0.5 - 1.9 mmol/L    Comment: CRITICAL VALUE NOTED. VALUE IS CONSISTENT WITH PREVIOUSLY REPORTED/CALLED VALUE MU Performed at [facility], [address] [place], [place] [address]   Blood Culture (routine x 2)     Status: None (Preliminary result)   Collection Time: 01/05/23  4:37 PM   Specimen: BLOOD  Result Value Ref Range   Specimen Description BLOOD BLOOD RIGHT FOREARM    Special Requests      BOTTLES DRAWN AEROBIC AND ANAEROBIC Blood Culture adequate volume   Culture      NO GROWTH < 24 HOURS Performed at [facility], [address] [place], [place] [address]    Report  Status PENDING   Blood Culture (routine x 2)     Status: None (Preliminary result)   Collection Time: 01/05/23  4:37 PM   Specimen: BLOOD  Result Value Ref Range  Specimen Description BLOOD LEFT ANTECUBITAL    Special Requests      BOTTLES DRAWN AEROBIC AND ANAEROBIC Blood Culture adequate volume   Culture      NO GROWTH < 24 HOURS Performed at [facility], [address] [place], [place] [address]    Report Status PENDING   Body fluid culture w Gram Stain     Status: None (Preliminary result)   Collection Time: 01/05/23  6:55 PM   Specimen: Peritoneal Dialysate; Body Fluid  Result Value Ref Range   Specimen Description      PERITONEAL DIALYSATE Performed at [facility], [address] [place], [place] [address]    Special Requests      Immunocompromised Performed at [facility], [address] [place], [place] [address]    Gram Stain      RARE WBC PRESENT, PREDOMINANTLY MONONUCLEAR NO ORGANISMS SEEN    Culture      NO GROWTH < 12 HOURS Performed at [facility], [address] [place], [place] [address]    Report Status PENDING   Body fluid cell count with differential     Status: Abnormal   Collection Time: 01/05/23  6:55 PM  Result Value Ref Range   Fluid Type-FCT PERITONEAL     Comment: Performed at [facility], [address] [place], [place] [address] CORRECTED ON 10/02 AT 1406: PREVIOUSLY REPORTED AS Peritoneal    Color, Fluid COLORLESS    Appearance, Fluid CLEAR (A) CLEAR   Total Nucleated Cell Count, Fluid 4 0 - 1,000 cu mm   Other Cells, Fluid TOO FEW TO COUNT, SMEAR AVAILABLE FOR REVIEW %    Comment: FEW NEUTROPHILS, FEW LYMPHOCYTES, RARE MONOCYTES Performed at [facility], [address], [place], [place] [address]   Magnesium     Status: Abnormal   Collection Time: 01/05/23  8:04 PM  Result Value Ref Range   Magnesium 1.3 (L) 1.7 - 2.4 mg/dL    Comment: Performed at [facility],  [address] [place], [place] [address]  Lactic acid, plasma     Status: Abnormal   Collection Time: 01/05/23  8:05 PM  Result Value Ref Range   Lactic Acid, Venous 2.3 (HH) 0.5 - 1.9 mmol/L    Comment: CRITICAL VALUE NOTED. VALUE IS CONSISTENT WITH PREVIOUSLY REPORTED/CALLED VALUE MU Performed at [facility], [address] [place], [place] [address]   Type and screen     Status: None (Preliminary result)   Collection Time: 01/05/23  8:05 PM  Result Value Ref Range   ABO/RH(D) PENDING    Antibody Screen PENDING    Sample Expiration      01/08/2023,2359 Performed at [facility], [address] [place], [place] [address]   Blood gas, venous     Status: Abnormal   Collection Time: 01/05/23  8:05 PM  Result Value Ref Range   pH, Ven 7.26 7.25 - 7.43   pCO2, Ven 41 (L) 44 - 60 mmHg   pO2, Ven 36 32 - 45 mmHg   Bicarbonate 18.4 (L) 20.0 - 28.0 mmol/L   Acid-base deficit 8.3 (H) 0.0 - 2.0 mmol/L   O2 Saturation 55.3 %   Patient temperature 37.0    Collection site VEIN     Comment: Performed at [facility], [address] [place], [place] [address]  Urinalysis, w/ Reflex to Culture (Infection Suspected) -Urine, Clean Catch     Status: Abnormal  Collection Time: 01/05/23 10:24 PM  Result Value Ref Range   Specimen Source URINE, RANDOM     Comment: CORRECTED ON 10/01 AT 2245: PREVIOUSLY REPORTED AS URINE, CLEAN CATCH   Color, Urine YELLOW (A) YELLOW   APPearance CLOUDY (A) CLEAR   Specific Gravity, Urine 1.015 1.005 - 1.030   pH 5.0 5.0 - 8.0   Glucose, UA 50 (A) NEGATIVE mg/dL   Hgb urine dipstick SMALL (A) NEGATIVE   Bilirubin Urine NEGATIVE NEGATIVE   Ketones, ur NEGATIVE NEGATIVE mg/dL   Protein, ur 161 (A) NEGATIVE mg/dL   Nitrite NEGATIVE NEGATIVE   Leukocytes,Ua LARGE (A) NEGATIVE   RBC / HPF 0-5 0 - 5 RBC/hpf   WBC, UA >50 0 - 5 WBC/hpf    Comment:        Reflex urine culture not performed if WBC <=10, OR if Squamous  epithelial cells >5. If Squamous epithelial cells >5 suggest recollection.    Bacteria, UA MANY (A) NONE SEEN   Squamous Epithelial / HPF 0-5 0 - 5 /HPF    Comment: Performed at [facility], [address] [place], [place] [address]  Hemoglobin A1c     Status: Abnormal   Collection Time: 01/05/23 10:24 PM  Result Value Ref Range   Hgb A1c MFr Bld 7.5 (H) 4.8 - 5.6 %    Comment: (NOTE) Pre diabetes:          5.7%-6.4%  Diabetes:              >6.4%  Glycemic control for   <7.0% adults with diabetes    Mean Plasma Glucose 168.55 mg/dL    Comment: Performed at [facility], [address], [place], [place] [address]  Type and screen     Status: None (Preliminary result)   Collection Time: 01/05/23 10:24 PM  Result Value Ref Range   ABO/RH(D) PENDING    Antibody Screen PENDING    Sample Expiration      01/08/2023,2359 Performed at [facility], [address] [place], [place] [address]   Urine Culture     Status: Abnormal (Preliminary result)   Collection Time: 01/05/23 10:24 PM   Specimen: Urine, Random  Result Value Ref Range   Specimen Description      URINE, RANDOM Performed at [facility], [address] [place], [place] [address]    Special Requests      NONE Reflexed from [phone] Performed at [facility], [address], [place], [place] [address]    Culture (A)     >=100,000 COLONIES/mL GRAM NEGATIVE RODS SUSCEPTIBILITIES TO FOLLOW Performed at [facility], [address], [place], [place] [address]    Report Status PENDING   Lactic acid, plasma     Status: Abnormal   Collection Time: 01/05/23 11:44 PM  Result Value Ref Range   Lactic Acid, Venous 3.0 (HH) 0.5 - 1.9 mmol/L    Comment: CRITICAL VALUE NOTED. VALUE IS CONSISTENT WITH PREVIOUSLY REPORTED/CALLED VALUE SKL Performed at [facility], [address] [place], [place] [address]   Type and screen Ordered by PROVIDER DEFAULT     Status:  None   Collection Time: 01/05/23 11:44 PM  Result Value Ref Range   ABO/RH(D) A NEG    Antibody Screen NEG    Sample Expiration      01/08/2023,2359 Performed at [facility], [address] [place], [place] [address]   CBG monitoring, ED  Status: Abnormal   Collection Time: 01/06/23 12:34 AM  Result Value Ref Range   Glucose-Capillary 119 (H) 70 - 99 mg/dL    Comment: Glucose reference range applies only to samples taken after fasting for at least 8 hours.  Heparin level (unfractionated)     Status: None   Collection Time: 01/06/23  5:18 AM  Result Value Ref Range   Heparin Unfractionated 0.52 0.30 - 0.70 IU/mL    Comment: (NOTE) The clinical reportable range upper limit is being lowered to >1.10 to align with the FDA approved guidance for the current laboratory assay.  If heparin results are below expected values, and patient dosage has  been confirmed, suggest follow up testing of antithrombin III levels. Performed at [facility], [address] [place], [place] [address]   CBC     Status: Abnormal   Collection Time: 01/06/23  5:18 AM  Result Value Ref Range   WBC 27.8 (H) 4.0 - 10.5 K/uL   RBC 2.94 (L) 4.22 - 5.81 MIL/uL   Hemoglobin 9.4 (L) 13.0 - 17.0 g/dL   HCT 62.1 (L) 30.8 - 65.7 %   MCV 95.6 80.0 - 100.0 fL   MCH 32.0 26.0 - 34.0 pg   MCHC 33.5 30.0 - 36.0 g/dL   RDW 84.6 96.2 - 95.2 %   Platelets 119 (L) 150 - 400 K/uL   nRBC 0.0 0.0 - 0.2 %    Comment: Performed at [facility], [address] [place], [place] [address]  Hepatitis B surface antigen     Status: None   Collection Time: 01/06/23  5:18 AM  Result Value Ref Range   Hepatitis B Surface Ag NON REACTIVE NON REACTIVE    Comment: Performed at [facility], [address] [place], [place] [address]  Hepatitis B surface antibody,quantitative     Status: Abnormal   Collection Time: 01/06/23  5:18 AM  Result Value Ref Range   Hep B S AB Quant (Post) 4.3 (L)  Immunity>10 mIU/mL    Comment: (NOTE)  Status of Immunity                     Anti-HBs Level  ------------------                     -------------- Inconsistent with Immunity                  0.0 - 10.0 Consistent with Immunity                         >10.0 Performed At: [facility] [address] [place], [place] [id number] Jolene Schimke MD IH:[id number]   Protime-INR     Status: Abnormal   Collection Time: 01/06/23  5:18 AM  Result Value Ref Range   Prothrombin Time 16.7 (H) 11.4 - 15.2 seconds   INR 1.3 (H) 0.8 - 1.2    Comment: (NOTE) INR goal varies based on device and disease states. Performed at [facility], [address] [place], [place] [address]   Cortisol-am, blood     Status: Abnormal   Collection Time: 01/06/23  5:18 AM  Result Value Ref Range   Cortisol - AM 39.9 (H) 6.7 - 22.6 ug/dL    Comment: Performed at [facility], [address] [place], [place] [address]  Procalcitonin     Status: None   Collection Time: 01/06/23  5:18  AM  Result Value Ref Range   Procalcitonin 74.73 ng/mL    Comment:        Interpretation: PCT >= 10 ng/mL: Important systemic inflammatory response, almost exclusively due to severe bacterial sepsis or septic shock. (NOTE)       Sepsis PCT Algorithm           Lower Respiratory Tract                                      Infection PCT Algorithm    ----------------------------     ----------------------------         PCT < 0.25 ng/mL                PCT < 0.10 ng/mL          Strongly encourage             Strongly discourage   discontinuation of antibiotics    initiation of antibiotics    ----------------------------     -----------------------------       PCT 0.25 - 0.50 ng/mL            PCT 0.10 - 0.25 ng/mL               OR       >80% decrease in PCT            Discourage initiation of                                            antibiotics      Encourage discontinuation           of antibiotics     ----------------------------     -----------------------------         PCT >= 0.50 ng/mL              PCT 0.26 - 0.50 ng/mL                AND       <80% decrease in PCT             Encourage initiation of                                             antibiotics       Encourage continuation           of antibiotics    ----------------------------     -----------------------------        PCT >= 0.50 ng/mL                  PCT > 0.50 ng/mL               AND         increase in PCT                  Strongly encourage                                      initiation of antibiotics    Strongly encourage escalation  of antibiotics                                     -----------------------------                                           PCT <= 0.25 ng/mL                                                 OR                                        > 80% decrease in PCT                                      Discontinue / Do not initiate                                             antibiotics  Performed at [facility], [address] [place] [address] [place], [place] [address]   CBG monitoring, ED     Status: Abnormal   Collection Time: 01/06/23  7:31 AM  Result Value Ref Range   Glucose-Capillary 58 (L) 70 - 99 mg/dL    Comment: Glucose reference range applies only to samples taken after fasting for at least 8 hours.  MRSA Next Gen by PCR, Nasal     Status: None   Collection Time: 01/06/23  8:00 AM   Specimen: Nasal Mucosa; Nasal Swab  Result Value Ref Range   MRSA by PCR Next Gen NOT DETECTED NOT DETECTED    Comment: (NOTE) The GeneXpert MRSA Assay (FDA approved for NASAL specimens only), is one component of a comprehensive MRSA colonization surveillance program. It is not intended to diagnose MRSA infection nor to guide or monitor treatment for MRSA infections. Test performance is not FDA approved in patients less than 17 years old. Performed at [facility], [address] [place], [place] [address]   CBG monitoring, ED     Status: None   Collection Time: 01/06/23  8:17 AM  Result Value Ref Range   Glucose-Capillary 76 70 - 99 mg/dL    Comment: Glucose reference range applies only to samples taken after fasting for at least 8 hours.  CBG monitoring, ED     Status: None   Collection Time: 01/06/23 12:15 PM  Result Value Ref Range   Glucose-Capillary 88 70 - 99 mg/dL    Comment: Glucose reference range applies only to samples taken after fasting for at least 8 hours.  Heparin level (unfractionated)     Status: None   Collection Time: 01/06/23  1:34 PM  Result Value Ref Range   Heparin Unfractionated 0.53 0.30 - 0.70 IU/mL    Comment: (NOTE) The clinical reportable range upper limit is being lowered to >1.10 to align with the FDA approved guidance for the current  laboratory assay.  If heparin results are below expected values, and patient dosage has  been confirmed, suggest follow up testing of antithrombin III levels. Performed at [facility], [address] [place], [place] [address]   Magnesium     Status: None   Collection Time: 01/06/23  1:34 PM  Result Value Ref Range   Magnesium 1.8 1.7 - 2.4 mg/dL    Comment: Performed at [facility], [address] [place], [place] [address]  CBG monitoring, ED     Status: Abnormal   Collection Time: 01/06/23  4:41 PM  Result Value Ref Range   Glucose-Capillary 59 (L) 70 - 99 mg/dL    Comment: Glucose reference range applies only to samples taken after fasting for at least 8 hours.  CBG monitoring, ED     Status: Abnormal   Collection Time: 01/06/23 11:46 PM  Result Value Ref Range   Glucose-Capillary 105 (H) 70 - 99 mg/dL    Comment: Glucose reference range applies only to samples taken after fasting for at least 8 hours.  Basic metabolic panel     Status: Abnormal   Collection Time: 01/07/23  4:45 AM  Result Value Ref Range   Sodium 132 (L) 135 - 145 mmol/L   Potassium  3.5 3.5 - 5.1 mmol/L   Chloride 101 98 - 111 mmol/L   CO2 17 (L) 22 - 32 mmol/L   Glucose, Bld 148 (H) 70 - 99 mg/dL    Comment: Glucose reference range applies only to samples taken after fasting for at least 8 hours.   BUN 75 (H) 8 - 23 mg/dL   Creatinine, Ser 6.21 (H) 0.61 - 1.24 mg/dL   Calcium 8.3 (L) 8.9 - 10.3 mg/dL   GFR, Estimated 5 (L) >60 mL/min    Comment: (NOTE) Calculated using the CKD-EPI Creatinine Equation (2021)    Anion gap 13 5 - 15    Comment: Performed at [facility], [address] [place], [place] [address]  Magnesium     Status: None   Collection Time: 01/07/23  4:45 AM  Result Value Ref Range   Magnesium 1.9 1.7 - 2.4 mg/dL    Comment: Performed at [facility], [address] [place], [place] [address]  Phosphorus     Status: Abnormal   Collection Time: 01/07/23  4:45 AM  Result Value Ref Range   Phosphorus 6.6 (H) 2.5 - 4.6 mg/dL    Comment: Performed at [facility], [address] [place], [place] [address]  Heparin level (unfractionated)     Status: None   Collection Time: 01/07/23  4:45 AM  Result Value Ref Range   Heparin Unfractionated 0.44 0.30 - 0.70 IU/mL    Comment: (NOTE) The clinical reportable range upper limit is being lowered to >1.10 to align with the FDA approved guidance for the current laboratory assay.  If heparin results are below expected values, and patient dosage has  been confirmed, suggest follow up testing of antithrombin III levels. Performed at [facility], [address] [place], [place] [address]   CBC     Status: Abnormal   Collection Time: 01/07/23  4:45 AM  Result Value Ref Range   WBC 16.8 (H) 4.0 - 10.5 K/uL   RBC 2.53 (L) 4.22 - 5.81 MIL/uL   Hemoglobin 8.1 (L) 13.0 - 17.0 g/dL   HCT 44.0 (L) 10.2 - 72.5 %   MCV 95.7 80.0 - 100.0 fL   MCH 32.0  26.0 - 34.0 pg   MCHC 33.5 30.0 - 36.0 g/dL   RDW 11.9 14.7 - 82.9 %   Platelets 80 (L) 150 - 400 K/uL    Comment:  Immature Platelet Fraction may be clinically indicated, consider ordering this additional test [id number]    nRBC 0.0 0.0 - 0.2 %    Comment: Performed at [facility], [address] [place], [place] [address]  CBG monitoring, ED     Status: Abnormal   Collection Time: 01/07/23  7:25 AM  Result Value Ref Range   Glucose-Capillary 140 (H) 70 - 99 mg/dL    Comment: Glucose reference range applies only to samples taken after fasting for at least 8 hours.   CT ABDOMEN PELVIS WO CONTRAST  Result Date: 01/05/2023 CLINICAL DATA:  Abdominal pain EXAM: CT ABDOMEN AND PELVIS WITHOUT CONTRAST TECHNIQUE: Multidetector CT imaging of the abdomen and pelvis was performed following the standard protocol without IV contrast. RADIATION DOSE REDUCTION: This exam was performed according to the departmental dose-optimization program which includes automated exposure control, adjustment of the mA and/or kV according to patient size and/or use of iterative reconstruction technique. COMPARISON:  CT abdomen and pelvis dated October 17, 2012 FINDINGS: Lower chest: No acute abnormality. Hepatobiliary: No focal liver abnormality is seen. Status post cholecystectomy. No biliary dilatation. Pancreas: Unremarkable. No pancreatic ductal dilatation or surrounding inflammatory changes. Spleen: Normal in size without focal abnormality. Adrenals/Urinary Tract: Bilateral adrenal glands are unremarkable. Mild right hydronephrosis and hydroureter with asymmetric right perinephric stranding. No evidence of obstructing stone. Several cystic lesions of the right kidney are seen which are technically indeterminate, possibly due to streak artifact. Reference mildly hyperattenuating lesion of the interpolar region measuring 3.5 cm. Additional bilateral renal lesions are seen which are consistent with simple cysts. Bladder is unremarkable. Stomach/Bowel: Stomach is within normal limits. Appendix appears normal. No evidence of bowel wall  thickening, distention, or inflammatory changes. Vascular/Lymphatic: Aortic atherosclerosis. No enlarged abdominal or pelvic lymph nodes. Reproductive: Fiducial markers noted in the prostate. Other: Peritoneal dialysis catheter and trace abdominal ascites. No intra-abdominal free air. Musculoskeletal: Diffuse osseous sclerosis, likely sequela of chronic renal disease. IMPRESSION: 1. Mild right hydronephrosis and hydroureter with asymmetric right perinephric stranding. No evidence of obstructing stone. Findings may be due to recently passed stone or ascending infection. Recommend correlation with urinalysis. 2. Several cystic lesions of the right kidney are seen which are technically indeterminate, possibly due to streak artifact. Recommend further evaluation with nonemergent renal ultrasound. 3. Peritoneal dialysis catheter and trace abdominal ascites. 4. Aortic Atherosclerosis (ICD10-I70.0). Electronically Signed   By: Allegra Lai M.D.   On: 01/05/2023 19:07    Pending Labs Unresulted Labs (From admission, onward)     Start     Ordered   01/08/23 0500  Heparin level (unfractionated)  Tomorrow morning,   R        01/07/23 0559   01/08/23 0500  CBC  Tomorrow morning,   R        01/07/23 0559   01/08/23 0500  Basic metabolic panel  Tomorrow morning,   R        01/07/23 0943   01/08/23 0500  Magnesium  Tomorrow morning,   R        01/07/23 0943   01/07/23 0500  Vancomycin, random  Tomorrow morning,   R        01/06/23 1130            Vitals/Pain Today's Vitals   01/07/23  0700 01/07/23 0734 01/07/23 0923 01/07/23 0950  BP: (!) 116/55  (!) 144/62 134/69  Pulse: 65  68 80  Resp: (!) 21  20 18   Temp:  98.1 F (36.7 C) 98.1 F (36.7 C) 98.4 F (36.9 C)  TempSrc:  Oral Oral Oral  SpO2: 94%  99% 97%  Weight:      Height:      PainSc:        Isolation Precautions No active isolations  Medications Medications  clopidogrel (PLAVIX) tablet 75 mg (75 mg Oral Given 01/06/23 1925)   atorvastatin (LIPITOR) tablet 40 mg (40 mg Oral Given 01/06/23 1925)  insulin aspart (novoLOG) injection 0-6 Units (0 Units Subcutaneous Hold 01/07/23 0730)  albuterol (PROVENTIL) (2.5 MG/3ML) 0.083% nebulizer solution 2.5 mg (has no administration in time range)  sodium chloride flush (NS) 0.9 % injection 3 mL (3 mLs Intravenous Given 01/07/23 0922)  sodium chloride flush (NS) 0.9 % injection 3 mL (has no administration in time range)  acetaminophen (TYLENOL) tablet 650 mg (650 mg Oral Given 01/06/23 1333)    Or  acetaminophen (TYLENOL) suppository 650 mg ( Rectal See Alternative 01/06/23 1333)  melatonin tablet 2.5 mg (2.5 mg Oral Given 01/06/23 2208)  ondansetron (ZOFRAN) injection 4 mg (4 mg Intravenous Given 01/07/23 0920)  traMADol (ULTRAM) tablet 50 mg (50 mg Oral Given 01/06/23 2013)  gentamicin cream (GARAMYCIN) 0.1 % 1 Application (has no administration in time range)  dialysis solution 1.5% low-MG/low-CA dianeal solution (has no administration in time range)  dialysis solution 2.5% low-MG/low-CA dianeal solution (has no administration in time range)  pantoprazole (PROTONIX) EC tablet 40 mg (40 mg Oral Given 01/07/23 0906)  cefTRIAXone (ROCEPHIN) 2 g in sodium chloride 0.9 % 100 mL IVPB (2 g Intravenous New Bag/Given 01/07/23 0913)  potassium chloride SA (KLOR-CON M) CR tablet 20 mEq (has no administration in time range)  sodium chloride 0.9 % bolus 500 mL (0 mLs Intravenous Stopped 01/05/23 1735)  ondansetron (ZOFRAN) injection 4 mg (4 mg Intravenous Given 01/05/23 1702)  ceFEPIme (MAXIPIME) 2 g in sodium chloride 0.9 % 100 mL IVPB (0 g Intravenous Stopped 01/05/23 1758)  metroNIDAZOLE (FLAGYL) IVPB 500 mg (0 mg Intravenous Stopped 01/05/23 1952)  vancomycin (VANCOCIN) IVPB 1000 mg/200 mL premix (0 mg Intravenous Stopped 01/05/23 2315)  sodium chloride 0.9 % bolus 1,000 mL (0 mLs Intravenous Stopped 01/05/23 1935)  sodium chloride 0.9 % bolus 1,000 mL (0 mLs Intravenous Stopped 01/05/23 2315)   heparin bolus via infusion 4,700 Units (4,700 Units Intravenous Bolus from Bag 01/05/23 2043)  ondansetron (ZOFRAN) injection 4 mg (4 mg Intravenous Given 01/05/23 2221)  vancomycin (VANCOCIN) IVPB 1000 mg/200 mL premix (0 mg Intravenous Stopped 01/06/23 0206)  sodium chloride 0.9 % bolus 500 mL (0 mLs Intravenous Stopped 01/06/23 0321)  magnesium sulfate IVPB 2 g 50 mL (0 g Intravenous Stopped 01/06/23 0249)  magnesium sulfate IVPB 2 g 50 mL (0 g Intravenous Stopped 01/06/23 1607)    Mobility walks     Focused Assessments Cardiac Assessment Handoff:  Cardiac Rhythm: Normal sinus rhythm Lab Results  Component Value Date   TROPONINI <0.03 05/09/2018   No results found for: "DDIMER" Does the Patient currently have chest pain? No    R Recommendations: See Admitting Provider Note  Report given to:   Additional Notes:

## 2023-01-07 NOTE — Progress Notes (Signed)
  Peritoneal Dialysis Treatment Initiation Note     Consent signed and in chart.  PD treatment initiated via aseptic technique.    Patient is awake and alert. No complaints of pain.    PD exit site clean, dry and intact.  Gentamicin and new dressing applied.    Hand-off given to the patient's nurse.  Education provided to dept staff  regarding PD machine and how  to contact tech support if machine  alarms.   Pt and wife told they are not to touch machine or unhook pt when tx is ended. Wife stateing that they have been doing this for years and that it is unacceptable that they aren't allowed to touch machine. And that she will be speaking to Dr Thedore Mins about all of this.  Pt and wife argumenttive with me d/t the fact there is no dialysis nurse here all night long to look after pt.  And that we wouldn't leave a hemodialysis pt hooked up and leave them.  Explained that if machine has alarms, the primary nurse will get in touch with baxter to help fix alarms.   Pt c/o getting on late last night and complaining again  today why is he getting on so early. I explained both reasons to him.   Lynann Beaver LPN [facility]

## 2023-01-07 NOTE — TOC Initial Note (Signed)
Transition of Care [facility]) - Initial/Assessment Note    Patient Details  Name: Tyler Wiley MRN: [id number] Date of Birth: 1946-01-26  Transition of Care [facility]) CM/SW Contact:    Marquita Palms, LCSW Phone Number: 01/07/2023, 9:25 AM  Clinical Narrative:                 CSW met with patient and wife at bedside. Patient reported that he uses [facility] on [address] in [place]. He reported that his wife will transport him home and that his PCP is located at [facility]. Patient reports he has dialysis equipment at home. No other needs at this time.        Patient Goals and CMS Choice            Expected Discharge Plan and Services                                              Prior Living Arrangements/Services                       Activities of Daily Living   ADL Screening (condition at time of admission) Independently performs ADLs?: Yes (appropriate for developmental age) Is the patient deaf or have difficulty hearing?: No Does the patient have difficulty seeing, even when wearing glasses/contacts?: No Does the patient have difficulty concentrating, remembering, or making decisions?: No  Permission Sought/Granted                  Emotional Assessment              Admission diagnosis:  Nausea and vomiting [R11.2] Severe sepsis (HCC) [A41.9, R65.20] Patient Active Problem List   Diagnosis Date Noted   Paroxysmal atrial fibrillation (HCC) 01/06/2023   Hypomagnesemia 01/06/2023   Atrial fibrillation (HCC) 01/06/2023   Severe sepsis (HCC) 01/05/2023   Nausea with vomiting 01/05/2023   Electrolyte abnormality 01/05/2023   Anemia of chronic kidney failure, unspecified stage 01/05/2023   Pyelonephritis 01/05/2023   ESRD needing dialysis (HCC) 10/04/2018   Hx of completed stroke 06/02/2018   TIA (transient ischemic attack) 05/09/2018   Stroke (cerebrum) (HCC) 05/07/2018   OSA (obstructive sleep apnea) 10/18/2013   Gastritis  12/06/2012   HTN (hypertension) 12/22/2011   Type 2 diabetes mellitus with chronic kidney disease on chronic dialysis, without long-term current use of insulin (HCC) 12/22/2011   PCP:  Lauro Regulus, MD Pharmacy:   [facility] - [place], [place] - [address] [place] [address] Phone: [phone] Fax: [phone]  [facility] - [place], [place] - [address] [place] [address] Phone: [phone] Fax: [phone]     Social Determinants of Health (SDOH) Social History: SDOH Screenings   Food Insecurity: No Food Insecurity (01/06/2023)  Housing: Low Risk  (01/06/2023)  Transportation Needs: No Transportation Needs (01/06/2023)  Utilities: Not At Risk (01/06/2023)  Financial Resource Strain: Low Risk  (12/10/2022)   Received from [facility]  Tobacco Use: Medium Risk (01/06/2023)   SDOH Interventions:     Readmission Risk Interventions     No data to display

## 2023-01-07 NOTE — Progress Notes (Addendum)
[facility]  - [place] at [facility]   PATIENT NAME: Tyler Wiley    MR#:  [id number]  DATE OF BIRTH:  [birth year]  SUBJECTIVE:  wife at bedside. Patient had small amount of emesis today. Has been able to tolerated PO diet on and off. Has some back pain. No fever  VITALS:  Blood pressure 134/69, pulse 80, temperature 98.4 F (36.9 C), temperature source Oral, resp. rate 18, height 5\' 11"  (1.803 m), weight 107.6 kg, SpO2 97%.  PHYSICAL EXAMINATION:   GENERAL:  77 y.o.-year-old patient with no acute distress. Obese LUNGS: Normal breath sounds bilaterally, no wheezing CARDIOVASCULAR: S1, S2 normal. No murmur   ABDOMEN: Soft, nontender, nondistended. PD cath+ EXTREMITIES: + edema b/l.    NEUROLOGIC: nonfocal  patient is alert and awake  LABORATORY PANEL:  CBC Recent Labs  Lab 01/07/23 0445  WBC 16.8*  HGB 8.1*  HCT 24.2*  PLT 80*    Chemistries  Recent Labs  Lab 01/05/23 1535 01/05/23 2004 01/07/23 0445  NA 138  --  132*  K 4.4  --  3.5  CL 99  --  101  CO2 19*  --  17*  GLUCOSE 198*  --  148*  BUN 77*  --  75*  CREATININE 10.27*  --  9.65*  CALCIUM 9.0  --  8.3*  MG  --    < > 1.9  AST 18  --   --   ALT 18  --   --   ALKPHOS 116  --   --   BILITOT 0.9  --   --    < > = values in this interval not displayed.   RADIOLOGY:  ECHOCARDIOGRAM COMPLETE  Result Date: 01/07/2023    ECHOCARDIOGRAM REPORT   Patient Name:   Tyler Wiley Date of Exam: 01/06/2023 Medical Rec #:  [id number]     Height:       71.0 in Accession #:    [id number]    Weight:       214.0 lb Date of Birth:  05-Feb-1946      BSA:          2.170 m Patient Age:    77 years      BP:           131/62 mmHg Patient Gender: M             HR:           92 bpm. Exam Location:  [facility] Procedure: 2D Echo, Cardiac Doppler and Color Doppler Indications:     I48.91 Atrial Fibrillation  History:         Patient has prior history of Echocardiogram examinations, most                  recent 05/07/2018. CAD,  Stroke; Risk Factors:Hypertension and                  Diabetes.  Sonographer:     Daphine Deutscher RDCS Referring Phys:  [id number] V Naliya Gish Diagnosing Phys: Mellody Drown Alluri IMPRESSIONS  1. Left ventricular ejection fraction, by estimation, is 60 to 65%. The left ventricle has normal function. The left ventricle has no regional wall motion abnormalities. There is mild left ventricular hypertrophy. Left ventricular diastolic parameters were normal.  2. Right ventricular systolic function is normal. The right ventricular size is normal.  3. The mitral valve is normal in structure. Trivial mitral valve regurgitation.  4. The aortic valve is  tricuspid. There is mild thickening of the aortic valve. Aortic valve regurgitation is trivial.  5. Aortic dilatation noted. There is mild dilatation of the ascending aorta, measuring 44 mm.  6. The inferior vena cava is normal in size with greater than 50% respiratory variability, suggesting right atrial pressure of 3 mmHg. FINDINGS  Left Ventricle: Left ventricular ejection fraction, by estimation, is 60 to 65%. The left ventricle has normal function. The left ventricle has no regional wall motion abnormalities. The left ventricular internal cavity size was normal in size. There is  mild left ventricular hypertrophy. Left ventricular diastolic parameters were normal. Right Ventricle: The right ventricular size is normal. No increase in right ventricular wall thickness. Right ventricular systolic function is normal. Left Atrium: Left atrial size was normal in size. Right Atrium: Right atrial size was normal in size. Pericardium: There is no evidence of pericardial effusion. Mitral Valve: The mitral valve is normal in structure. Trivial mitral valve regurgitation. Tricuspid Valve: The tricuspid valve is normal in structure. Tricuspid valve regurgitation is trivial. Aortic Valve: The aortic valve is tricuspid. There is mild thickening of the aortic valve. Aortic valve  regurgitation is trivial. Pulmonic Valve: The pulmonic valve was not well visualized. Pulmonic valve regurgitation is not visualized. Aorta: Aortic dilatation noted. There is mild dilatation of the ascending aorta, measuring 44 mm. Venous: The inferior vena cava is normal in size with greater than 50% respiratory variability, suggesting right atrial pressure of 3 mmHg. IAS/Shunts: No atrial level shunt detected by color flow Doppler.  LEFT VENTRICLE PLAX 2D LVIDd:         5.00 cm   Diastology LVIDs:         3.00 cm   LV e' medial:    8.43 cm/s LV PW:         1.2 cm    LV E/e' medial:  8.4 LV IVS:        1.2 cm    LV e' lateral:   9.19 cm/s LVOT diam:     2.20 cm   LV E/e' lateral: 7.7 LV SV:         113 LV SV Index:   52 LVOT Area:     3.80 cm  RIGHT VENTRICLE             IVC RV Basal diam:  4.00 cm     IVC diam: 2.20 cm RV S prime:     22.30 cm/s TAPSE (M-mode): 2.3 cm LEFT ATRIUM             Index        RIGHT ATRIUM           Index LA diam:        4.90 cm 2.26 cm/m   RA Area:     14.20 cm LA Vol (A2C):   61.9 ml 28.53 ml/m  RA Volume:   37.50 ml  17.28 ml/m LA Vol (A4C):   34.3 ml 15.81 ml/m LA Biplane Vol: 48.8 ml 22.49 ml/m  AORTIC VALVE LVOT Vmax:   147.50 cm/s LVOT Vmean:  102.200 cm/s LVOT VTI:    0.296 m  AORTA Ao Root diam: 4.00 cm Ao Asc diam:  4.40 cm MITRAL VALVE MV Area (PHT): 2.84 cm    SHUNTS MV Decel Time: 268 msec    Systemic VTI:  0.30 m MV E velocity: 70.50 cm/s  Systemic Diam: 2.20 cm MV A velocity: 93.60 cm/s MV E/A ratio:  0.75 Windell Norfolk Electronically signed  by Windell Norfolk Signature Date/Time: 01/07/2023/1:40:05 PM    Final    CT ABDOMEN PELVIS WO CONTRAST  Result Date: 01/05/2023 CLINICAL DATA:  Abdominal pain EXAM: CT ABDOMEN AND PELVIS WITHOUT CONTRAST TECHNIQUE: Multidetector CT imaging of the abdomen and pelvis was performed following the standard protocol without IV contrast. RADIATION DOSE REDUCTION: This exam was performed according to the departmental  dose-optimization program which includes automated exposure control, adjustment of the mA and/or kV according to patient size and/or use of iterative reconstruction technique. COMPARISON:  CT abdomen and pelvis dated October 17, 2012 FINDINGS: Lower chest: No acute abnormality. Hepatobiliary: No focal liver abnormality is seen. Status post cholecystectomy. No biliary dilatation. Pancreas: Unremarkable. No pancreatic ductal dilatation or surrounding inflammatory changes. Spleen: Normal in size without focal abnormality. Adrenals/Urinary Tract: Bilateral adrenal glands are unremarkable. Mild right hydronephrosis and hydroureter with asymmetric right perinephric stranding. No evidence of obstructing stone. Several cystic lesions of the right kidney are seen which are technically indeterminate, possibly due to streak artifact. Reference mildly hyperattenuating lesion of the interpolar region measuring 3.5 cm. Additional bilateral renal lesions are seen which are consistent with simple cysts. Bladder is unremarkable. Stomach/Bowel: Stomach is within normal limits. Appendix appears normal. No evidence of bowel wall thickening, distention, or inflammatory changes. Vascular/Lymphatic: Aortic atherosclerosis. No enlarged abdominal or pelvic lymph nodes. Reproductive: Fiducial markers noted in the prostate. Other: Peritoneal dialysis catheter and trace abdominal ascites. No intra-abdominal free air. Musculoskeletal: Diffuse osseous sclerosis, likely sequela of chronic renal disease. IMPRESSION: 1. Mild right hydronephrosis and hydroureter with asymmetric right perinephric stranding. No evidence of obstructing stone. Findings may be due to recently passed stone or ascending infection. Recommend correlation with urinalysis. 2. Several cystic lesions of the right kidney are seen which are technically indeterminate, possibly due to streak artifact. Recommend further evaluation with nonemergent renal ultrasound. 3. Peritoneal  dialysis catheter and trace abdominal ascites. 4. Aortic Atherosclerosis (ICD10-I70.0). Electronically Signed   By: Allegra Lai M.D.   On: 01/05/2023 19:07    Assessment and Plan ESTEFANO CROMBIE is a 77 year old male with past medical conditions including hypertension, stroke, diabetes, dyslipidemia, cholecystectomy, and end-stage renal disease on peritoneal dialysis.  Patient presents to the emergency department complaining of weakness, nausea and vomiting   Patient reports he was recently diagnosed with UTI and has been prescribed couple different antibiotics by PCP. He still makes urine at home.  CT abdomen pelvis shows non obstructive hydronephrosis.  UA appears cloudy with leukocytes and bacteria.  Lactic acid has improved to 3.0.(5.1)   Severe sepsis due to recurrent UTI/pyelonephritis right side -- patient came in with elevated lactic acid,  elevated white count, tachycardia abnormal UA and abnormal CT scan -- outpatient urine culture September 19 showed E. Coli -- this is patient's third episode of UTI. ID consultation with Dr. Joylene Draft -- PD fluid negative for bacteria. Few WBC. Patient denies abdominal pain -- received IV fluids. -- De-escalate antibiotics will defer to ID --WBC 27.7K--16.8 --UC >100K ecoli  Atrial fibrillation with RVR-- new onset in the setting of sepsis -- patient heart rates down to 80s -- repeat EKG-- sinus rhythm with PAC -- cardiology consultation with Dr. Corky Sing-- okay to resume eliquis once platelet count stabilize. Discontinue Plavix. Started on PO metoprolol low-dose. Follow-up outpatient. --currently on heparin gtt--d/ced due to drop in plt count  End-stage renal disease on peritoneal dialysis type II diabetes -- PD fluid negative for bacteria -- nephrology consultation for in-house peritoneal dialysis -- sliding scale insulin --  hold home meds for now  Anemia of chronic disease acute thrombocytopenia -- hemoglobin stable -- suspect drop  in platelet count due to sepsis. Continue to monitor counts. Holding blood thinners for now  Low Magnesium -- replete -- pharmacy to manage electrolytes  CA stenosis/h/o CVA --on statins, plavix at home  OSA/Obesity--on CPAP  PT OT to see patient  Procedures: Family communication :wife Consults :ID, Nephrology, cardiology CODE STATUS: full DVT Prophylaxis : SCD Level of care: Telemetry Medical Status is: Inpatient Remains inpatient appropriate because: sepsis    TOTAL TIME TAKING CARE OF THIS PATIENT: 35 minutes.  >50% time spent on counselling and coordination of care  Note: This dictation was prepared with Dragon dictation along with smaller phrase technology. Any transcriptional errors that result from this process are unintentional.  Enedina Finner M.D    [facility]   CC: Primary care physician; Lauro Regulus, MD

## 2023-01-07 NOTE — Progress Notes (Signed)
[facility]  ROUNDING NOTE   Subjective:   Tyler Wiley is a 77 year old male with past medical conditions including hypertension, stroke, diabetes, dyslipidemia, cholecystectomy, and end-stage renal disease on peritoneal dialysis.  Patient presents to the emergency department complaining of weakness, nausea and vomiting.  Patient has been admitted for Pyelonephritis [N12] Nausea and vomiting [R11.2] Severe sepsis (HCC) [A41.9, R65.20] Atrial fibrillation, unspecified type [facility]) [I48.91]  Patient is known to our practice and receives peritoneal dialysis management through the [facility] clinic, supervised by Dr. Thedore Mins.    Patient seen resting in bed Reports little sleep overnight due to PD alarms Voices frustration from PD treatment, extended treatment time, alarms, etc   Objective:  Vital signs in last 24 hours:  Temp:  [98 F (36.7 C)-98.4 F (36.9 C)] 98.4 F (36.9 C) (10/03 0950) Pulse Rate:  [61-89] 80 (10/03 0950) Resp:  [16-28] 18 (10/03 0950) BP: (101-146)/(50-74) 134/69 (10/03 0950) SpO2:  [92 %-99 %] 97 % (10/03 0950) Weight:  [107.6 kg] 107.6 kg (10/02 2030)  Weight change: 10.5 kg Filed Weights   01/05/23 1953 01/06/23 2030  Weight: 97.1 kg 107.6 kg    Intake/Output: I/O last 3 completed shifts: In: 5388.1 [I.V.:1600; IV Piggyback:3788.1] Out: 445 [Urine:445]   Intake/Output this shift:  Total I/O In: 500.2 [I.V.:500.2] Out: 1665 [Other:1665]  Physical Exam: General: NAD  Head: Normocephalic, atraumatic. Moist oral mucosal membranes  Eyes: Anicteric  Lungs:  Clear to auscultation, normal effort  Heart: Regular rate and rhythm  Abdomen:  Soft, nontender, mild distention  Extremities: No peripheral edema.  Neurologic: Alert and oriented, moving all four extremities  Skin: No lesions  Access: PD catheter    Basic Metabolic Panel: Recent Labs  Lab 01/05/23 1535 01/05/23 2004 01/06/23 1334 01/07/23 0445  NA 138  --   --  132*   K 4.4  --   --  3.5  CL 99  --   --  101  CO2 19*  --   --  17*  GLUCOSE 198*  --   --  148*  BUN 77*  --   --  75*  CREATININE 10.27*  --   --  9.65*  CALCIUM 9.0  --   --  8.3*  MG  --  1.3* 1.8 1.9  PHOS  --   --   --  6.6*    Liver Function Tests: Recent Labs  Lab 01/05/23 1535  AST 18  ALT 18  ALKPHOS 116  BILITOT 0.9  PROT 7.9  ALBUMIN 3.9   Recent Labs  Lab 01/05/23 1535  LIPASE 48   No results for input(s): "AMMONIA" in the last 168 hours.  CBC: Recent Labs  Lab 01/05/23 1535 01/06/23 0518 01/07/23 0445  WBC 13.0* 27.8* 16.8*  HGB 12.1* 9.4* 8.1*  HCT 36.9* 28.1* 24.2*  MCV 95.8 95.6 95.7  PLT 181 119* 80*    Cardiac Enzymes: No results for input(s): "CKTOTAL", "CKMB", "CKMBINDEX", "TROPONINI" in the last 168 hours.  BNP: Invalid input(s): "POCBNP"  CBG: Recent Labs  Lab 01/06/23 1215 01/06/23 1641 01/06/23 2346 01/07/23 0725 01/07/23 1306  GLUCAP 88 59* 105* 140* 121*    Microbiology: Results for orders placed or performed during the hospital encounter of 01/05/23  Blood Culture (routine x 2)     Status: None (Preliminary result)   Collection Time: 01/05/23  4:37 PM   Specimen: BLOOD  Result Value Ref Range Status   Specimen Description BLOOD BLOOD RIGHT FOREARM  Final   Special Requests   Final    BOTTLES DRAWN AEROBIC AND ANAEROBIC Blood Culture adequate volume   Culture   Final    NO GROWTH 2 DAYS Performed at [facility], [address] [place], [place] [address]    Report Status PENDING  Incomplete  Blood Culture (routine x 2)     Status: None (Preliminary result)   Collection Time: 01/05/23  4:37 PM   Specimen: BLOOD  Result Value Ref Range Status   Specimen Description BLOOD LEFT ANTECUBITAL  Final   Special Requests   Final    BOTTLES DRAWN AEROBIC AND ANAEROBIC Blood Culture adequate volume   Culture   Final    NO GROWTH 2 DAYS Performed at [facility], [address], [place], [place]  [address]    Report Status PENDING  Incomplete  Body fluid culture w Gram Stain     Status: None (Preliminary result)   Collection Time: 01/05/23  6:55 PM   Specimen: Peritoneal Dialysate; Body Fluid  Result Value Ref Range Status   Specimen Description   Final    PERITONEAL DIALYSATE Performed at [facility], [address] [place], [place] [address]    Special Requests   Final    Immunocompromised Performed at [facility], [address] [place], [place] [address]    Gram Stain   Final    RARE WBC PRESENT, PREDOMINANTLY MONONUCLEAR NO ORGANISMS SEEN    Culture   Final    NO GROWTH < 12 HOURS Performed at [facility], [address], [place], [place] [address]    Report Status PENDING  Incomplete  Urine Culture     Status: Abnormal (Preliminary result)   Collection Time: 01/05/23 10:24 PM   Specimen: Urine, Random  Result Value Ref Range Status   Specimen Description   Final    URINE, RANDOM Performed at [facility], [address], [place], [place] [address]    Special Requests   Final    NONE Reflexed from [id number] Performed at [facility], [address] [place], [place] [address]    Culture (A)  Final    >=100,000 COLONIES/mL ESCHERICHIA COLI SUSCEPTIBILITIES TO FOLLOW Performed at [facility], [address], [place], [place] [address]    Report Status PENDING  Incomplete  MRSA Next Gen by PCR, Nasal     Status: None   Collection Time: 01/06/23  8:00 AM   Specimen: Nasal Mucosa; Nasal Swab  Result Value Ref Range Status   MRSA by PCR Next Gen NOT DETECTED NOT DETECTED Final    Comment: (NOTE) The GeneXpert MRSA Assay (FDA approved for NASAL specimens only), is one component of a comprehensive MRSA colonization surveillance program. It is not intended to diagnose MRSA infection nor to guide or monitor treatment for MRSA infections. Test performance is not FDA approved in patients less than 8  years old. Performed at [facility], [address] [place], [place] [address]     Coagulation Studies: Recent Labs    01/06/23 0518  LABPROT 16.7*  INR 1.3*    Urinalysis: Recent Labs    01/05/23 2224  COLORURINE YELLOW*  LABSPEC 1.015  PHURINE 5.0  GLUCOSEU 50*  HGBUR SMALL*  BILIRUBINUR NEGATIVE  KETONESUR NEGATIVE  PROTEINUR 100*  NITRITE NEGATIVE  LEUKOCYTESUR LARGE*      Imaging: CT ABDOMEN PELVIS WO CONTRAST  Result Date: 01/05/2023 CLINICAL DATA:  Abdominal pain EXAM: CT  ABDOMEN AND PELVIS WITHOUT CONTRAST TECHNIQUE: Multidetector CT imaging of the abdomen and pelvis was performed following the standard protocol without IV contrast. RADIATION DOSE REDUCTION: This exam was performed according to the departmental dose-optimization program which includes automated exposure control, adjustment of the mA and/or kV according to patient size and/or use of iterative reconstruction technique. COMPARISON:  CT abdomen and pelvis dated October 17, 2012 FINDINGS: Lower chest: No acute abnormality. Hepatobiliary: No focal liver abnormality is seen. Status post cholecystectomy. No biliary dilatation. Pancreas: Unremarkable. No pancreatic ductal dilatation or surrounding inflammatory changes. Spleen: Normal in size without focal abnormality. Adrenals/Urinary Tract: Bilateral adrenal glands are unremarkable. Mild right hydronephrosis and hydroureter with asymmetric right perinephric stranding. No evidence of obstructing stone. Several cystic lesions of the right kidney are seen which are technically indeterminate, possibly due to streak artifact. Reference mildly hyperattenuating lesion of the interpolar region measuring 3.5 cm. Additional bilateral renal lesions are seen which are consistent with simple cysts. Bladder is unremarkable. Stomach/Bowel: Stomach is within normal limits. Appendix appears normal. No evidence of bowel wall thickening, distention, or inflammatory changes.  Vascular/Lymphatic: Aortic atherosclerosis. No enlarged abdominal or pelvic lymph nodes. Reproductive: Fiducial markers noted in the prostate. Other: Peritoneal dialysis catheter and trace abdominal ascites. No intra-abdominal free air. Musculoskeletal: Diffuse osseous sclerosis, likely sequela of chronic renal disease. IMPRESSION: 1. Mild right hydronephrosis and hydroureter with asymmetric right perinephric stranding. No evidence of obstructing stone. Findings may be due to recently passed stone or ascending infection. Recommend correlation with urinalysis. 2. Several cystic lesions of the right kidney are seen which are technically indeterminate, possibly due to streak artifact. Recommend further evaluation with nonemergent renal ultrasound. 3. Peritoneal dialysis catheter and trace abdominal ascites. 4. Aortic Atherosclerosis (ICD10-I70.0). Electronically Signed   By: Allegra Lai M.D.   On: 01/05/2023 19:07     Medications:    cefTRIAXone (ROCEPHIN)  IV Stopped (01/07/23 0957)   dialysis solution 1.5% low-MG/low-CA     dialysis solution 2.5% low-MG/low-CA      atorvastatin  40 mg Oral q1800   clopidogrel  75 mg Oral QPM   gentamicin cream  1 Application Topical Daily   insulin aspart  0-6 Units Subcutaneous TID WC   melatonin  2.5 mg Oral QHS   metoprolol succinate  25 mg Oral Daily   pantoprazole  40 mg Oral Daily   sodium chloride flush  3 mL Intravenous Q12H   acetaminophen **OR** acetaminophen, albuterol, ondansetron (ZOFRAN) IV, sodium chloride flush, traMADol  Assessment/ Plan:  Mr. MUSSA GROESBECK is a 77 y.o.  male with past medical conditions including hypertension, stroke, diabetes, dyslipidemia, cholecystectomy, and end-stage renal disease on peritoneal dialysis.  Patient presents to the emergency department complaining of weakness, nausea and vomiting.  Patient has been admitted for Pyelonephritis [N12] Nausea and vomiting [R11.2] Severe sepsis (HCC) [A41.9, R65.20] Atrial  fibrillation, unspecified type (HCC) [I48.91]   End-stage renal disease on peritoneal dialysis.  Patient completes nightly dialysis 6 nights a week.  Completed treatment overnight, UF 1.6L achieved. Will continue nightly treatments  2.  Sepsis, suspected secondary to pyelonephritis.  Urine culture positive for E-coli. Receiving IV Cipro and Septra.  Continue supportive care.  Clinical exam appears less likely peritonitis.  Negative cell count. PD cultures negative.   3. Anemia of chronic kidney disease Lab Results  Component Value Date   HGB 8.1 (L) 01/07/2023    Hemoglobin 8.1.  Patient receives monthly Mircera outpatient.   4.  Hypertension with chronic kidney disease.  Home regimen includes amlodipine, carvedilol, and torsemide.  Receiving metoprolol only  5. Secondary Hyperparathyroidism: with outpatient labs: PTH 765, phosphorus 5.8, calcium 8.9 on 12/09/22.   Lab Results  Component Value Date   CALCIUM 8.3 (L) 01/07/2023   PHOS 6.6 (H) 01/07/2023    Hyperphosphatemia noted. Will encourage diet management.    LOS: 2 Nekayla Heider 10/3/20241:34 PM

## 2023-01-07 NOTE — Progress Notes (Signed)
Pt refused CPAP therapy this admission.

## 2023-01-08 ENCOUNTER — Other Ambulatory Visit (HOSPITAL_COMMUNITY): Payer: Self-pay

## 2023-01-08 DIAGNOSIS — R1114 Bilious vomiting: Secondary | ICD-10-CM | POA: Diagnosis not present

## 2023-01-08 LAB — CBC
HCT: 24.1 % — ABNORMAL LOW (ref 39.0–52.0)
Hemoglobin: 8.4 g/dL — ABNORMAL LOW (ref 13.0–17.0)
MCH: 31.9 pg (ref 26.0–34.0)
MCHC: 34.9 g/dL (ref 30.0–36.0)
MCV: 91.6 fL (ref 80.0–100.0)
Platelets: 85 10*3/uL — ABNORMAL LOW (ref 150–400)
RBC: 2.63 MIL/uL — ABNORMAL LOW (ref 4.22–5.81)
RDW: 14.1 % (ref 11.5–15.5)
WBC: 13.4 10*3/uL — ABNORMAL HIGH (ref 4.0–10.5)
nRBC: 0 % (ref 0.0–0.2)

## 2023-01-08 LAB — BASIC METABOLIC PANEL
Anion gap: 10 (ref 5–15)
BUN: 68 mg/dL — ABNORMAL HIGH (ref 8–23)
CO2: 21 mmol/L — ABNORMAL LOW (ref 22–32)
Calcium: 8.4 mg/dL — ABNORMAL LOW (ref 8.9–10.3)
Chloride: 102 mmol/L (ref 98–111)
Creatinine, Ser: 9.62 mg/dL — ABNORMAL HIGH (ref 0.61–1.24)
GFR, Estimated: 5 mL/min — ABNORMAL LOW (ref >60)
Glucose, Bld: 211 mg/dL — ABNORMAL HIGH (ref 70–99)
Potassium: 3.6 mmol/L (ref 3.5–5.1)
Sodium: 133 mmol/L — ABNORMAL LOW (ref 135–145)

## 2023-01-08 LAB — URINE CULTURE: Culture: >=100000 — AB

## 2023-01-08 LAB — MAGNESIUM: Magnesium: 2.1 mg/dL (ref 1.7–2.4)

## 2023-01-08 LAB — GLUCOSE, CAPILLARY
Glucose-Capillary: 132 mg/dL — ABNORMAL HIGH (ref 70–99)
Glucose-Capillary: 225 mg/dL — ABNORMAL HIGH (ref 70–99)
Glucose-Capillary: 203 mg/dL — ABNORMAL HIGH (ref 70–99)

## 2023-01-08 MED ORDER — INSULIN ASPART 100 UNIT/ML IJ SOLN: 0.0000 [IU] | Freq: Three times a day (TID) | INTRAMUSCULAR | Status: DC

## 2023-01-08 MED ORDER — INSULIN ASPART 100 UNIT/ML IJ SOLN: 0.0000 [IU] | Freq: Every day | INTRAMUSCULAR | Status: DC

## 2023-01-08 NOTE — Progress Notes (Signed)
Date of Admission:  01/05/2023      ID: Tyler Wiley is a 77 y.o. male  Principal Problem:   Nausea with vomiting Active Problems:   Gastritis   HTN (hypertension)   OSA (obstructive sleep apnea)   Type 2 diabetes mellitus with chronic kidney disease on chronic dialysis, without long-term current use of insulin (HCC)   Severe sepsis (HCC)   Electrolyte abnormality   Anemia of chronic kidney failure, unspecified stage   Pyelonephritis   Paroxysmal atrial fibrillation (HCC)   ESRD needing dialysis (HCC)   Hx of completed stroke   Hypomagnesemia   Atrial fibrillation (HCC)   Complicated UTI (urinary tract infection)    Subjective: Pt is sleeping  Last night he could not sleep because of PD machine causing problems Wife says he was feeling better today Eating better Medications:   atorvastatin  40 mg Oral q1800   gentamicin cream  1 Application Topical Daily   insulin aspart  0-6 Units Subcutaneous TID WC   melatonin  2.5 mg Oral QHS   metoprolol succinate  25 mg Oral Daily   pantoprazole  40 mg Oral Daily   sodium chloride flush  3 mL Intravenous Q12H    Objective: Vital signs in last 24 hours: Patient Vitals for the past 24 hrs:  BP Temp Temp src Pulse Resp SpO2 Weight  01/08/23 0741 129/78 98.5 F (36.9 C) Oral 77 17 99 % --  01/08/23 0451 -- -- -- -- -- -- 102 kg  01/08/23 0448 (!) 121/95 98.2 F (36.8 C) Oral 91 17 95 % --  01/07/23 2013 (!) 141/70 98.5 F (36.9 C) Oral 93 19 99 % --  01/07/23 1600 (!) 150/84 98.1 F (36.7 C) Oral 76 20 95 % --      PHYSICAL EXAM:  NA  Lab Results    Latest Ref Rng & Units 01/08/2023    3:39 AM 01/07/2023    4:45 AM 01/06/2023    5:18 AM  CBC  WBC 4.0 - 10.5 K/uL 13.4  16.8  27.8   Hemoglobin 13.0 - 17.0 g/dL 8.4  8.1  9.4   Hematocrit 39.0 - 52.0 % 24.1  24.2  28.1   Platelets 150 - 400 K/uL 85  80  119        Latest Ref Rng & Units 01/08/2023    3:39 AM 01/07/2023    4:45 AM 01/05/2023    3:35 PM  CMP   Glucose 70 - 99 mg/dL 235  573  220   BUN 8 - 23 mg/dL 68  75  77   Creatinine 0.61 - 1.24 mg/dL 2.54  2.70  62.37   Sodium 135 - 145 mmol/L 133  132  138   Potassium 3.5 - 5.1 mmol/L 3.6  3.5  4.4   Chloride 98 - 111 mmol/L 102  101  99   CO2 22 - 32 mmol/L 21  17  19    Calcium 8.9 - 10.3 mg/dL 8.4  8.3  9.0   Total Protein 6.5 - 8.1 g/dL   7.9   Total Bilirubin 0.3 - 1.2 mg/dL   0.9   Alkaline Phos 38 - 126 U/L   116   AST 15 - 41 U/L   18   ALT 0 - 44 U/L   18       Microbiology: BC- Ng UC -ecoli  Studies/Results: ECHOCARDIOGRAM COMPLETE  Result Date: 01/07/2023    ECHOCARDIOGRAM REPORT   Patient  Name:   Tyler Wiley Date of Exam: 01/06/2023 Medical Rec #:  [id number]     Height:       71.0 in Accession #:    [id number]    Weight:       214.0 lb Date of Birth:  29-Aug-1945      BSA:          2.170 m Patient Age:    77 years      BP:           131/62 mmHg Patient Gender: M             HR:           92 bpm. Exam Location:  [facility] Procedure: 2D Echo, Cardiac Doppler and Color Doppler Indications:     I48.91 Atrial Fibrillation  History:         Patient has prior history of Echocardiogram examinations, most                  recent 05/07/2018. CAD, Stroke; Risk Factors:Hypertension and                  Diabetes.  Sonographer:     Daphine Deutscher RDCS Referring Phys:  [id number] EKTA V PATEL Diagnosing Phys: Mellody Drown Alluri IMPRESSIONS  1. Left ventricular ejection fraction, by estimation, is 60 to 65%. The left ventricle has normal function. The left ventricle has no regional wall motion abnormalities. There is mild left ventricular hypertrophy. Left ventricular diastolic parameters were normal.  2. Right ventricular systolic function is normal. The right ventricular size is normal.  3. The mitral valve is normal in structure. Trivial mitral valve regurgitation.  4. The aortic valve is tricuspid. There is mild thickening of the aortic valve. Aortic valve regurgitation is trivial.  5. Aortic  dilatation noted. There is mild dilatation of the ascending aorta, measuring 44 mm.  6. The inferior vena cava is normal in size with greater than 50% respiratory variability, suggesting right atrial pressure of 3 mmHg. FINDINGS  Left Ventricle: Left ventricular ejection fraction, by estimation, is 60 to 65%. The left ventricle has normal function. The left ventricle has no regional wall motion abnormalities. The left ventricular internal cavity size was normal in size. There is  mild left ventricular hypertrophy. Left ventricular diastolic parameters were normal. Right Ventricle: The right ventricular size is normal. No increase in right ventricular wall thickness. Right ventricular systolic function is normal. Left Atrium: Left atrial size was normal in size. Right Atrium: Right atrial size was normal in size. Pericardium: There is no evidence of pericardial effusion. Mitral Valve: The mitral valve is normal in structure. Trivial mitral valve regurgitation. Tricuspid Valve: The tricuspid valve is normal in structure. Tricuspid valve regurgitation is trivial. Aortic Valve: The aortic valve is tricuspid. There is mild thickening of the aortic valve. Aortic valve regurgitation is trivial. Pulmonic Valve: The pulmonic valve was not well visualized. Pulmonic valve regurgitation is not visualized. Aorta: Aortic dilatation noted. There is mild dilatation of the ascending aorta, measuring 44 mm. Venous: The inferior vena cava is normal in size with greater than 50% respiratory variability, suggesting right atrial pressure of 3 mmHg. IAS/Shunts: No atrial level shunt detected by color flow Doppler.  LEFT VENTRICLE PLAX 2D LVIDd:         5.00 cm   Diastology LVIDs:         3.00 cm   LV e' medial:    8.43 cm/s LV PW:  1.2 cm    LV E/e' medial:  8.4 LV IVS:        1.2 cm    LV e' lateral:   9.19 cm/s LVOT diam:     2.20 cm   LV E/e' lateral: 7.7 LV SV:         113 LV SV Index:   52 LVOT Area:     3.80 cm  RIGHT  VENTRICLE             IVC RV Basal diam:  4.00 cm     IVC diam: 2.20 cm RV S prime:     22.30 cm/s TAPSE (M-mode): 2.3 cm LEFT ATRIUM             Index        RIGHT ATRIUM           Index LA diam:        4.90 cm 2.26 cm/m   RA Area:     14.20 cm LA Vol (A2C):   61.9 ml 28.53 ml/m  RA Volume:   37.50 ml  17.28 ml/m LA Vol (A4C):   34.3 ml 15.81 ml/m LA Biplane Vol: 48.8 ml 22.49 ml/m  AORTIC VALVE LVOT Vmax:   147.50 cm/s LVOT Vmean:  102.200 cm/s LVOT VTI:    0.296 m  AORTA Ao Root diam: 4.00 cm Ao Asc diam:  4.40 cm MITRAL VALVE MV Area (PHT): 2.84 cm    SHUNTS MV Decel Time: 268 msec    Systemic VTI:  0.30 m MV E velocity: 70.50 cm/s  Systemic Diam: 2.20 cm MV A velocity: 93.60 cm/s MV E/A ratio:  0.75 Mellody Drown Alluri Electronically signed by Windell Norfolk Signature Date/Time: 01/07/2023/1:40:05 PM    Final      Assessment/Plan:  ?pt presenting with weakness, being off balance and chills, multiple symptomatic UTI in the past- Colonized with ecoli   Urosepsis with rt hydronephrosis and hydroureter with perinephric stranding Complicated UTI due to [facility] On ceftriaxone X day 4- continue the same as long as in hospital  Will give total of 14 days of antibiotics- On discharge he can go on cephalexin 500mg  PO BID until 01/18/23 ( pt was on multiple courses of cipro in sept) incomplete urinary emptying ruled out -  Has urolift done 5 years ago -need to follow up with urology   Severe nausea and poor appeitie is unlikely to be due to infection- it has been chronic, and has persisted even after stopping ozempic- wonder whether he needs to be on hemodialysis  DM Was On ozempic caused nausea and he stopped it  with weight   ESRD on PD No peritonitis  Discussed the management with wife ID will sign off- call if needed

## 2023-01-08 NOTE — Consult Note (Signed)
PHARMACY CONSULT NOTE - ELECTROLYTES  Pharmacy Consult for Electrolyte Monitoring and Replacement   Recent Labs: Height: 5\' 11"  (180.3 cm) Weight: 102 kg (224 lb 13.9 oz) IBW/kg (Calculated) : 75.3 Estimated Creatinine Clearance: 7.8 mL/min (A) (by C-G formula based on SCr of 9.62 mg/dL (H)). Potassium (mmol/L)  Date Value  01/08/2023 3.6  10/17/2012 4.2   Magnesium (mg/dL)  Date Value  82/95/6213 2.1  10/17/2012 1.9   Calcium (mg/dL)  Date Value  08/65/7846 8.4 (L)   Calcium, Total (mg/dL)  Date Value  96/29/5284 9.2   Albumin (g/dL)  Date Value  13/24/4010 3.9  10/17/2012 3.5   Phosphorus (mg/dL)  Date Value  27/25/3664 6.6 (H)   Sodium (mmol/L)  Date Value  01/08/2023 133 (L)  10/17/2012 135 (L)   Assessment  Tyler Wiley is a 77 y.o. male presenting with UTI and N/V. PMH significant for ESRD on PD, HTN, HLD, OSA, and CVA. Pharmacy has been consulted to monitor and replace electrolytes.  Diet: Renal  Goal of Therapy: Electrolytes WNL  Plan:  No electrolyte replacement indicated at this time Labs are stable overall. Consider decreasing frequency of lab checks to [phone]  Thank you for allowing pharmacy to be a part of this patient's care.  Tressie Ellis 01/08/2023 9:16 AM

## 2023-01-08 NOTE — Evaluation (Signed)
Occupational Therapy Evaluation Patient Details Name: Tyler Wiley MRN: [id number] DOB: 20-Nov-1945 Today's Date: 01/08/2023   History of Present Illness [name] is a 77 y.o. male with medical history significant for diabetes mellitus type 2, hypertension, OSA, history of stroke, gastritis, cholecystectomy history, dyslipidemia, on ESRD on peritoneal dialysis patient still makes urine and voids,  Presenting with generalized weakness nausea vomiting.  Ill-appearing, hypotensive, dizzy, tachycardic, pale appearing on initial presentation.  Per report patient started vomiting this morning.  Patient was diagnosed with UTI by primary care and started on antibiotics.   Clinical Impression   Pt was seen for OT evaluation this date. Prior to hospital admission, pt was supine in bed. Pt lives with wife. Pt ambulated within room to the sink and did handwashing with supervision. Pt completed toilet t/f with supervision and ambulated back to sit at the EOB with no AE used. Pt independently donn and doff socks and went from sit>supine. Pt left supine in bed with call bell within reach and bed alarm. Pt would benefit from skilled OT services to address noted impairments and functional limitations (see below for any additional details) in order to maximize safety and independence while minimizing falls risk and caregiver burden. Anticipate the need for follow up OT services upon acute hospital DC.       If plan is discharge home, recommend the following: Help with stairs or ramp for entrance;Assistance with cooking/housework;Assist for transportation    Functional Status Assessment  Patient has had a recent decline in their functional status and demonstrates the ability to make significant improvements in function in a reasonable and predictable amount of time.  Equipment Recommendations  None recommended by OT    Recommendations for Other Services       Precautions / Restrictions  Precautions Precautions: None Restrictions Weight Bearing Restrictions: No      Mobility Bed Mobility Overal bed mobility: Independent                  Transfers Overall transfer level: Independent                        Balance Overall balance assessment: Independent                                         ADL either performed or assessed with clinical judgement   ADL Overall ADL's : Needs assistance/impaired                                       General ADL Comments: Pt reported feeling dizzy and required supervision during toilet t/f, ambulating within room pt declined to walk in the hallway, and standing at sink during hand washing.     Vision         Perception         Praxis         Pertinent Vitals/Pain Pain Assessment Pain Assessment: No/denies pain     Extremity/Trunk Assessment Upper Extremity Assessment Upper Extremity Assessment: Generalized weakness   Lower Extremity Assessment Lower Extremity Assessment: Generalized weakness   Cervical / Trunk Assessment Cervical / Trunk Assessment: Normal   Communication Communication Communication: No apparent difficulties   Cognition Arousal: Alert Behavior During Therapy: WFL for tasks assessed/performed Overall Cognitive Status: Within  Functional Limits for tasks assessed                                       General Comments       Exercises     Shoulder Instructions      Home Living Family/patient expects to be discharged to:: Private residence Living Arrangements: Spouse/significant other Available Help at Discharge: Family Type of Home: House Home Access: Stairs to enter Secretary/administrator of Steps: 2 Entrance Stairs-Rails: Left Home Layout: One level     Bathroom Shower/Tub: Producer, television/film/video: Handicapped height     Home Equipment: Agricultural consultant (2 wheels);Cane - single point   Additional  Comments: home equipment avaialble but does not use them at home much.      Prior Functioning/Environment Prior Level of Function : Independent/Modified Independent             Mobility Comments: Pt still able to drive, but when feeling dizzy wife drives. ADLs Comments: Independence in all ADL'S, Wife does most IADL's.        OT Problem List: Decreased strength;Decreased activity tolerance;Decreased coordination      OT Treatment/Interventions: Self-care/ADL training    OT Goals(Current goals can be found in the care plan section) Acute Rehab OT Goals Patient Stated Goal: to get stronger and go home OT Goal Formulation: With patient/family Time For Goal Achievement: 01/22/23 Potential to Achieve Goals: Good ADL Goals Pt Will Perform Lower Body Dressing: sitting/lateral leans;Independently Pt Will Transfer to Toilet: ambulating;regular height toilet;Independently Pt Will Perform Toileting - Clothing Manipulation and hygiene: Independently;sit to/from stand  OT Frequency: Min 1X/week    Co-evaluation              AM-PAC OT "6 Clicks" Daily Activity     Outcome Measure Help from another person eating meals?: None Help from another person taking care of personal grooming?: None Help from another person toileting, which includes using toliet, bedpan, or urinal?: A Little Help from another person bathing (including washing, rinsing, drying)?: A Little Help from another person to put on and taking off regular upper body clothing?: None Help from another person to put on and taking off regular lower body clothing?: A Little 6 Click Score: 21   End of Session    Activity Tolerance: Patient tolerated treatment well Patient left: in bed;with call bell/phone within reach;with bed alarm set;with family/visitor present  OT Visit Diagnosis: Muscle weakness (generalized) (M62.81);Dizziness and giddiness (R42);Unsteadiness on feet (R26.81)                Time: 1047-1110 OT  Time Calculation (min): 23 min Charges:  OT General Charges $OT Visit: 1 Visit OT Evaluation $OT Eval Moderate Complexity: 1 Mod OT Treatments $Self Care/Home Management : 8-22 mins  Butch Penny, SOT

## 2023-01-08 NOTE — Care Management Important Message (Signed)
Important Message  Patient Details  Name: Tyler Wiley MRN: [id number] Date of Birth: Dec 23, 1945   Important Message Given:  Yes - Medicare IM     Johnell Comings 01/08/2023, 2:06 PM

## 2023-01-08 NOTE — Evaluation (Signed)
Physical Therapy Evaluation Patient Details Name: Tyler Wiley MRN: [id number] DOB: 1945-06-28 Today's Date: 01/08/2023  History of Present Illness  Tyler Wiley is a 77 y.o. male with medical history significant for diabetes mellitus type 2, hypertension, OSA, history of stroke, gastritis, cholecystectomy history, dyslipidemia, on ESRD on peritoneal dialysis patient still makes urine and voids,  Presenting with generalized weakness nausea vomiting.  Ill-appearing, hypotensive, dizzy, tachycardic, pale appearing on initial presentation.  Per report patient started vomiting this morning.  Patient was diagnosed with UTI by primary care and started on antibiotics.    Clinical Impression    Pt received in Semi-Fowler's position and with significant encouragement, agreeable to therapy.  Pt noted to be independent with all bed mobility, however did require some assistance when coming into standing.  Pt noted to have some dizziness upon standing that never fully resolved throughout the session.  Pt notes that he continuously feels dizziness throughout the day.  Pt able to ambulate around the nursing station, however on the final 40 feet, pt was asked to perform varying head turns/head nods/single leg stand/tandem stance, in order to challenge the pt's balance.  Pt with increasing difficulty performing ambulation while performing cervical extension, losing his balance on multiple attempts.  Pt also has significant difficulty with tandem walking and is unable to complete >2 steps.  Pt returned to the room with all needs met and call bell within reach.  Pt advised that outpatient PT may be able to assist in returning pt to baseline levels and improve overall balance.  Pt and wife agreeable to this recommendation.      If plan is discharge home, recommend the following: Help with stairs or ramp for entrance   Can travel by private vehicle        Equipment Recommendations None recommended by PT   Recommendations for Other Services       Functional Status Assessment Patient has had a recent decline in their functional status and demonstrates the ability to make significant improvements in function in a reasonable and predictable amount of time.     Precautions / Restrictions Precautions Precautions: None Restrictions Weight Bearing Restrictions: No      Mobility  Bed Mobility Overal bed mobility: Independent                  Transfers Overall transfer level: Independent                      Ambulation/Gait Ambulation/Gait assistance: Contact guard assist Gait Distance (Feet): 240 Feet Assistive device: None Gait Pattern/deviations: Staggering left, Staggering right, Drifts right/left, Trunk flexed, Narrow base of support, Step-through pattern Gait velocity: decreased     General Gait Details: Pt ambulates well at times, but also staggers/drifts at times as well, without any external factors.  Stairs            Wheelchair Mobility     Tilt Bed    Modified Rankin (Stroke Patients Only)       Balance Overall balance assessment: Independent, Needs assistance Sitting-balance support: No upper extremity supported, Feet supported Sitting balance-Leahy Scale: Normal     Standing balance support: No upper extremity supported, During functional activity Standing balance-Leahy Scale: Good                 High Level Balance Comments: Pt has increased difficulty with mobility when performing head nods, specifically when looking up towards the ceiling.  Pt also with significant difficulty performing  tandem stance and was unable to perform tandem walking.             Pertinent Vitals/Pain Pain Assessment Pain Assessment: No/denies pain    Home Living Family/patient expects to be discharged to:: Private residence Living Arrangements: Spouse/significant other Available Help at Discharge: Family Type of Home: House Home Access:  Stairs to enter Entrance Stairs-Rails: Left Entrance Stairs-Number of Steps: 2   Home Layout: One level Home Equipment: Agricultural consultant (2 wheels);Cane - single point Additional Comments: home equipment avaialble but does not use them at home much.    Prior Function Prior Level of Function : Independent/Modified Independent             Mobility Comments: Pt still able to drive, but when feeling dizzy wife drives. ADLs Comments: Independence in all ADL'S, Wife does most IADL's.     Extremity/Trunk Assessment   Upper Extremity Assessment Upper Extremity Assessment: Defer to OT evaluation;Generalized weakness    Lower Extremity Assessment Lower Extremity Assessment: Generalized weakness    Cervical / Trunk Assessment Cervical / Trunk Assessment: Normal  Communication   Communication Communication: No apparent difficulties  Cognition Arousal: Alert Behavior During Therapy: WFL for tasks assessed/performed Overall Cognitive Status: Within Functional Limits for tasks assessed                                          General Comments      Exercises     Assessment/Plan    PT Assessment Patient needs continued PT services  PT Problem List Decreased strength;Decreased activity tolerance;Decreased balance;Decreased mobility;Decreased safety awareness       PT Treatment Interventions DME instruction;Gait training;Stair training;Therapeutic activities;Therapeutic exercise;Balance training;Neuromuscular re-education;Canalith reposition    PT Goals (Current goals can be found in the Care Plan section)  Acute Rehab PT Goals Patient Stated Goal: to regain his balance if possible. PT Goal Formulation: With patient/family Time For Goal Achievement: 01/22/23 Potential to Achieve Goals: Fair    Frequency Min 1X/week     Co-evaluation               AM-PAC PT "6 Clicks" Mobility  Outcome Measure Help needed turning from your back to your side while  in a flat bed without using bedrails?: None Help needed moving from lying on your back to sitting on the side of a flat bed without using bedrails?: None Help needed moving to and from a bed to a chair (including a wheelchair)?: None Help needed standing up from a chair using your arms (e.g., wheelchair or bedside chair)?: None Help needed to walk in hospital room?: A Little Help needed climbing 3-5 steps with a railing? : A Little 6 Click Score: 22    End of Session Equipment Utilized During Treatment: Gait belt Activity Tolerance: Patient tolerated treatment well Patient left: in bed;with call bell/phone within reach;with family/visitor present Nurse Communication: Mobility status PT Visit Diagnosis: Unsteadiness on feet (R26.81);Other abnormalities of gait and mobility (R26.89);Muscle weakness (generalized) (M62.81);Difficulty in walking, not elsewhere classified (R26.2);Dizziness and giddiness (R42) BPPV - Right/Left :  (Potential to have BPPV by the description of his current symptoms.)    Time: 9563-8756 PT Time Calculation (min) (ACUTE ONLY): 13 min   Charges:   PT Evaluation $PT Eval Low Complexity: 1 Low   PT General Charges $$ ACUTE PT VISIT: 1 Visit         Ivin Booty  Sherral Hammers, PT, DPT Physical Therapist - [facility]  01/08/23, 4:14 PM

## 2023-01-08 NOTE — Progress Notes (Addendum)
[facility]  - [place] [facility]   PATIENT NAME: Tyler Wiley    MR#:  [id number]  DATE OF BIRTH:  [birth year]  SUBJECTIVE:  Pt reported feeling better, nausea resolved.  Pain over right flank mild.  VITALS:  Blood pressure (!) 109/48, pulse 100, temperature 98.6 F (37 C), resp. rate 16, height 5\' 11"  (1.803 m), weight 102 kg, SpO2 99%.  PHYSICAL EXAMINATION:   Constitutional: NAD, AAOx3 HEENT: conjunctivae and lids normal, EOMI CV: No cyanosis.   RESP: normal respiratory effort, on RA Neuro: II - XII grossly intact.   Psych: Normal mood and affect.  Appropriate judgement and reason   LABORATORY PANEL:  CBC Recent Labs  Lab 01/08/23 0339  WBC 13.4*  HGB 8.4*  HCT 24.1*  PLT 85*    Chemistries  Recent Labs  Lab 01/05/23 1535 01/05/23 2004 01/08/23 0339  NA 138   < > 133*  K 4.4   < > 3.6  CL 99   < > 102  CO2 19*   < > 21*  GLUCOSE 198*   < > 211*  BUN 77*   < > 68*  CREATININE 10.27*   < > 9.62*  CALCIUM 9.0   < > 8.4*  MG  --    < > 2.1  AST 18  --   --   ALT 18  --   --   ALKPHOS 116  --   --   BILITOT 0.9  --   --    < > = values in this interval not displayed.   RADIOLOGY:  ECHOCARDIOGRAM COMPLETE  Result Date: 01/07/2023    ECHOCARDIOGRAM REPORT   Patient Name:   Tyler Wiley Date of Exam: 01/06/2023 Medical Rec #:  [id number]     Height:       71.0 in Accession #:    [id number]    Weight:       214.0 lb Date of Birth:  1945/11/28      BSA:          2.170 m Patient Age:    77 years      BP:           131/62 mmHg Patient Gender: M             HR:           92 bpm. Exam Location:  [facility] Procedure: 2D Echo, Cardiac Doppler and Color Doppler Indications:     I48.91 Atrial Fibrillation  History:         Patient has prior history of Echocardiogram examinations, most                  recent 05/07/2018. CAD, Stroke; Risk Factors:Hypertension and                  Diabetes.  Sonographer:     Daphine Deutscher RDCS Referring Phys:  [id number]  PATEL Diagnosing Phys: Mellody Drown Alluri IMPRESSIONS  1. Left ventricular ejection fraction, by estimation, is 60 to 65%. The left ventricle has normal function. The left ventricle has no regional wall motion abnormalities. There is mild left ventricular hypertrophy. Left ventricular diastolic parameters were normal.  2. Right ventricular systolic function is normal. The right ventricular size is normal.  3. The mitral valve is normal in structure. Trivial mitral valve regurgitation.  4. The aortic valve is tricuspid. There is mild thickening of the aortic valve. Aortic valve regurgitation is trivial.  5. Aortic dilatation noted. There is mild dilatation of the ascending aorta, measuring 44 mm.  6. The inferior vena cava is normal in size with greater than 50% respiratory variability, suggesting right atrial pressure of 3 mmHg. FINDINGS  Left Ventricle: Left ventricular ejection fraction, by estimation, is 60 to 65%. The left ventricle has normal function. The left ventricle has no regional wall motion abnormalities. The left ventricular internal cavity size was normal in size. There is  mild left ventricular hypertrophy. Left ventricular diastolic parameters were normal. Right Ventricle: The right ventricular size is normal. No increase in right ventricular wall thickness. Right ventricular systolic function is normal. Left Atrium: Left atrial size was normal in size. Right Atrium: Right atrial size was normal in size. Pericardium: There is no evidence of pericardial effusion. Mitral Valve: The mitral valve is normal in structure. Trivial mitral valve regurgitation. Tricuspid Valve: The tricuspid valve is normal in structure. Tricuspid valve regurgitation is trivial. Aortic Valve: The aortic valve is tricuspid. There is mild thickening of the aortic valve. Aortic valve regurgitation is trivial. Pulmonic Valve: The pulmonic valve was not well visualized. Pulmonic valve regurgitation is not visualized. Aorta: Aortic  dilatation noted. There is mild dilatation of the ascending aorta, measuring 44 mm. Venous: The inferior vena cava is normal in size with greater than 50% respiratory variability, suggesting right atrial pressure of 3 mmHg. IAS/Shunts: No atrial level shunt detected by color flow Doppler.  LEFT VENTRICLE PLAX 2D LVIDd:         5.00 cm   Diastology LVIDs:         3.00 cm   LV e' medial:    8.43 cm/s LV PW:         1.2 cm    LV E/e' medial:  8.4 LV IVS:        1.2 cm    LV e' lateral:   9.19 cm/s LVOT diam:     2.20 cm   LV E/e' lateral: 7.7 LV SV:         113 LV SV Index:   52 LVOT Area:     3.80 cm  RIGHT VENTRICLE             IVC RV Basal diam:  4.00 cm     IVC diam: 2.20 cm RV S prime:     22.30 cm/s TAPSE (M-mode): 2.3 cm LEFT ATRIUM             Index        RIGHT ATRIUM           Index LA diam:        4.90 cm 2.26 cm/m   RA Area:     14.20 cm LA Vol (A2C):   61.9 ml 28.53 ml/m  RA Volume:   37.50 ml  17.28 ml/m LA Vol (A4C):   34.3 ml 15.81 ml/m LA Biplane Vol: 48.8 ml 22.49 ml/m  AORTIC VALVE LVOT Vmax:   147.50 cm/s LVOT Vmean:  102.200 cm/s LVOT VTI:    0.296 m  AORTA Ao Root diam: 4.00 cm Ao Asc diam:  4.40 cm MITRAL VALVE MV Area (PHT): 2.84 cm    SHUNTS MV Decel Time: 268 msec    Systemic VTI:  0.30 m MV E velocity: 70.50 cm/s  Systemic Diam: 2.20 cm MV A velocity: 93.60 cm/s MV E/A ratio:  0.75 Windell Norfolk Electronically signed by Windell Norfolk Signature Date/Time: 01/07/2023/1:40:05 PM    Final  Assessment and Plan Tyler Wiley is a 77 year old male with past medical conditions including hypertension, stroke, diabetes, dyslipidemia, cholecystectomy, and end-stage renal disease on peritoneal dialysis.  Patient presents to the emergency department complaining of weakness, nausea and vomiting   Patient reports he was recently diagnosed with UTI and has been prescribed couple different antibiotics by PCP. He still makes urine at home.  CT abdomen pelvis shows non obstructive  hydronephrosis.  UA appears cloudy with leukocytes and bacteria.  Lactic acid has improved to 3.0.(5.1)   Severe sepsis due to recurrent Complicated UTI/pyelonephritis right side -- patient came in with elevated lactic acid,  elevated white count, tachycardia abnormal UA and abnormal CT scan -- outpatient urine culture September 19 showed E. Coli -- this is patient's third episode of UTI. ID consultation with Dr. Joylene Draft --ruled out incomplete urinary emptying -- PD fluid negative for bacteria. Few WBC. Patient denies abdominal pain -- received IV fluids. --cefepime x2 days f/b ceftriaxone x2 days --cont ceftriaxone --On discharge he can go on cephalexin 500mg  PO BID until 01/18/23 (total 14 days of abx) --need urology f/u (had urolift done 5 years ago)  Atrial fibrillation with RVR-- new onset in the setting of sepsis -- patient heart rates down to 80s -- repeat EKG-- [facility] with [facility] -- cardiology consultation with Dr. Corky Sing-- okay to resume eliquis once platelet count stabilize. Discontinued Plavix. Started on PO metoprolol low-dose. Follow-up outpatient.  End-stage renal disease on peritoneal dialysis -- PD fluid negative for bacteria -- nephrology consultation for in-house peritoneal dialysis  type II diabetes --d/c BG checks and SSI (pt doesn't want insulin, and currently no great need).  Anemia of chronic disease acute thrombocytopenia -- hemoglobin stable -- suspect drop in platelet count due to sepsis. Continue to monitor counts.  --hold eliquis for now  Low Magnesium -- pharmacy to manage electrolytes  CA stenosis/h/o CVA --on statins, plavix at home  OSA/Obesity--on CPAP  PT OT to see patient  Procedures: Family communication :wife updated at bedside today Consults :ID, Nephrology, cardiology CODE STATUS: full DVT Prophylaxis : SCD Level of care: Telemetry Medical Status is: Inpatient    TOTAL TIME TAKING CARE OF THIS PATIENT: 35 minutes.  >50%  time spent on counselling and coordination of care   Darlin Priestly M.D    [facility]   CC: Primary care physician; Lauro Regulus, MD

## 2023-01-08 NOTE — Discharge Planning (Signed)
ESTABLISHED PERITONEAL DIALYSIS Outpatient Facility [facility]  [address] [place], [place]. [address] [phone]  Dimas Chyle Dialysis Coordinator II  [facility] Cell: [phone] eFax: [phone] [email or web address]

## 2023-01-08 NOTE — TOC Benefit Eligibility Note (Signed)
Patient Product/process development scientist completed.    The patient is insured through [facility]. Patient has Medicare and is not eligible for a copay card, but may be able to apply for patient assistance, if available.    Ran test claim for Eliquis 5 mg and the current 30 day co-pay is $149.53 due to being in Coverage Gap (donut hole).   This test claim was processed through [facility]- copay amounts may vary at other pharmacies due to pharmacy/plan contracts, or as the patient moves through the different stages of their insurance plan.     Roland Earl, CPHT Pharmacy Technician III Certified Patient Advocate [facility] Patient Advocate Team Direct Number: [phone]  Fax: [phone]

## 2023-01-08 NOTE — Progress Notes (Signed)
[facility]  ROUNDING NOTE   Subjective:   Tyler Wiley is a 77 year old male with past medical conditions including hypertension, stroke, diabetes, dyslipidemia, cholecystectomy, and end-stage renal disease on peritoneal dialysis.  Patient presents to the emergency department complaining of weakness, nausea and vomiting.  Patient has been admitted for Pyelonephritis [N12] Nausea and vomiting [R11.2] Severe sepsis (HCC) [A41.9, R65.20] Atrial fibrillation, unspecified type [facility]) [I48.91]  Patient is known to our practice and receives peritoneal dialysis management through the [facility] clinic, supervised by Dr. Thedore Mins.    Patient resting quietly  Wife at bedside Multiple alarms during PD treatment last night  Unable to retrieve treatment information.    Objective:  Vital signs in last 24 hours:  Temp:  [98.1 F (36.7 C)-98.5 F (36.9 C)] 98.5 F (36.9 C) (10/04 0741) Pulse Rate:  [76-93] 77 (10/04 0741) Resp:  [17-20] 17 (10/04 0741) BP: (121-150)/(70-95) 129/78 (10/04 0741) SpO2:  [95 %-99 %] 99 % (10/04 0741) Weight:  [102 kg] 102 kg (10/04 0451)  Weight change: -5.6 kg Filed Weights   01/05/23 1953 01/06/23 2030 01/08/23 0451  Weight: 97.1 kg 107.6 kg 102 kg    Intake/Output: I/O last 3 completed shifts: In: 851.1 [I.V.:500.2; IV Piggyback:350.9] Out: 2185 [Urine:520; Other:1665]   Intake/Output this shift:  Total I/O In: 240 [P.O.:240] Out: 997 [Other:997]  Physical Exam: General: NAD  Head: Normocephalic, atraumatic. Moist oral mucosal membranes  Eyes: Anicteric  Lungs:  Clear to auscultation, normal effort  Heart: Regular rate and rhythm  Abdomen:  Soft, nontender, mild distention  Extremities: No peripheral edema.  Neurologic: Alert and oriented, moving all four extremities  Skin: No lesions  Access: PD catheter    Basic Metabolic Panel: Recent Labs  Lab 01/05/23 1535 01/05/23 2004 01/06/23 1334 01/07/23 0445 01/08/23 0339   NA 138  --   --  132* 133*  K 4.4  --   --  3.5 3.6  CL 99  --   --  101 102  CO2 19*  --   --  17* 21*  GLUCOSE 198*  --   --  148* 211*  BUN 77*  --   --  75* 68*  CREATININE 10.27*  --   --  9.65* 9.62*  CALCIUM 9.0  --   --  8.3* 8.4*  MG  --  1.3* 1.8 1.9 2.1  PHOS  --   --   --  6.6*  --     Liver Function Tests: Recent Labs  Lab 01/05/23 1535  AST 18  ALT 18  ALKPHOS 116  BILITOT 0.9  PROT 7.9  ALBUMIN 3.9   Recent Labs  Lab 01/05/23 1535  LIPASE 48   No results for input(s): "AMMONIA" in the last 168 hours.  CBC: Recent Labs  Lab 01/05/23 1535 01/06/23 0518 01/07/23 0445 01/08/23 0339  WBC 13.0* 27.8* 16.8* 13.4*  HGB 12.1* 9.4* 8.1* 8.4*  HCT 36.9* 28.1* 24.2* 24.1*  MCV 95.8 95.6 95.7 91.6  PLT 181 119* 80* 85*    Cardiac Enzymes: No results for input(s): "CKTOTAL", "CKMB", "CKMBINDEX", "TROPONINI" in the last 168 hours.  BNP: Invalid input(s): "POCBNP"  CBG: Recent Labs  Lab 01/07/23 1306 01/07/23 1728 01/07/23 2126 01/08/23 0742 01/08/23 1145  GLUCAP 121* 173* 208* 132* 225*    Microbiology: Results for orders placed or performed during the hospital encounter of 01/05/23  Blood Culture (routine x 2)     Status: None (Preliminary result)   Collection  Time: 01/05/23  4:37 PM   Specimen: BLOOD  Result Value Ref Range Status   Specimen Description BLOOD BLOOD RIGHT FOREARM  Final   Special Requests   Final    BOTTLES DRAWN AEROBIC AND ANAEROBIC Blood Culture adequate volume   Culture   Final    NO GROWTH 3 DAYS Performed at [facility], [address] [place], [place] [address]    Report Status PENDING  Incomplete  Blood Culture (routine x 2)     Status: None (Preliminary result)   Collection Time: 01/05/23  4:37 PM   Specimen: BLOOD  Result Value Ref Range Status   Specimen Description BLOOD LEFT ANTECUBITAL  Final   Special Requests   Final    BOTTLES DRAWN AEROBIC AND ANAEROBIC Blood Culture adequate volume    Culture   Final    NO GROWTH 3 DAYS Performed at [facility], [address], [place], [place] [address]    Report Status PENDING  Incomplete  Body fluid culture w Gram Stain     Status: None (Preliminary result)   Collection Time: 01/05/23  6:55 PM   Specimen: Peritoneal Dialysate; Body Fluid  Result Value Ref Range Status   Specimen Description   Final    PERITONEAL DIALYSATE Performed at [facility], [address] [place], [place] [address]    Special Requests   Final    Immunocompromised Performed at [facility], [address] [place], [place] [address]    Gram Stain   Final    RARE WBC PRESENT, PREDOMINANTLY MONONUCLEAR NO ORGANISMS SEEN    Culture   Final    NO GROWTH 3 DAYS Performed at [facility], [address], [place], [place] [address]    Report Status PENDING  Incomplete  Urine Culture     Status: Abnormal   Collection Time: 01/05/23 10:24 PM   Specimen: Urine, Random  Result Value Ref Range Status   Specimen Description   Final    URINE, RANDOM Performed at [facility], [address] [place], [place] [address]    Special Requests   Final    NONE Reflexed from [id number] Performed at [facility] [address] [place], [place] [address]    Culture >=100,000 COLONIES/mL ESCHERICHIA COLI (A)  Final   Report Status 01/08/2023 FINAL  Final   Organism ID, Bacteria ESCHERICHIA COLI (A)  Final      Susceptibility   Escherichia coli - MIC*    AMPICILLIN >=32 RESISTANT Resistant     CEFAZOLIN <=4 SENSITIVE Sensitive     CEFEPIME <=0.12 SENSITIVE Sensitive     CEFTRIAXONE <=0.25 SENSITIVE Sensitive     CIPROFLOXACIN <=0.25 SENSITIVE Sensitive     GENTAMICIN <=1 SENSITIVE Sensitive     IMIPENEM <=0.25 SENSITIVE Sensitive     NITROFURANTOIN <=16 SENSITIVE Sensitive     TRIMETH/SULFA >=320 RESISTANT Resistant     AMPICILLIN/SULBACTAM 16 INTERMEDIATE Intermediate     PIP/TAZO <=4 SENSITIVE Sensitive      * >=100,000 COLONIES/mL ESCHERICHIA COLI  MRSA Next Gen by PCR, Nasal     Status: None   Collection Time: 01/06/23  8:00 AM   Specimen: Nasal Mucosa; Nasal Swab  Result Value Ref Range Status   MRSA by PCR Next Gen NOT DETECTED NOT DETECTED Final    Comment: (NOTE) The GeneXpert MRSA Assay (FDA approved for NASAL specimens only), is one component of a comprehensive MRSA colonization surveillance program. It is not intended  to diagnose MRSA infection nor to guide or monitor treatment for MRSA infections. Test performance is not FDA approved in patients less than 24 years old. Performed at [facility], [address] [place], [place] [address]     Coagulation Studies: Recent Labs    01/06/23 0518  LABPROT 16.7*  INR 1.3*    Urinalysis: Recent Labs    01/05/23 2224  COLORURINE YELLOW*  LABSPEC 1.015  PHURINE 5.0  GLUCOSEU 50*  HGBUR SMALL*  BILIRUBINUR NEGATIVE  KETONESUR NEGATIVE  PROTEINUR 100*  NITRITE NEGATIVE  LEUKOCYTESUR LARGE*      Imaging: ECHOCARDIOGRAM COMPLETE  Result Date: 01/07/2023    ECHOCARDIOGRAM REPORT   Patient Name:   KERAUN AARON Date of Exam: 01/06/2023 Medical Rec #:  [id number]     Height:       71.0 in Accession #:    [id number]    Weight:       214.0 lb Date of Birth:  12/05/1945      BSA:          2.170 m Patient Age:    77 years      BP:           131/62 mmHg Patient Gender: M             HR:           92 bpm. Exam Location:  [facility] Procedure: 2D Echo, Cardiac Doppler and Color Doppler Indications:     I48.91 Atrial Fibrillation  History:         Patient has prior history of Echocardiogram examinations, most                  recent 05/07/2018. CAD, Stroke; Risk Factors:Hypertension and                  Diabetes.  Sonographer:     Daphine Deutscher RDCS Referring Phys:  [id number] EKTA V PATEL Diagnosing Phys: Mellody Drown Alluri IMPRESSIONS  1. Left ventricular ejection fraction, by estimation, is 60 to 65%. The left ventricle has normal  function. The left ventricle has no regional wall motion abnormalities. There is mild left ventricular hypertrophy. Left ventricular diastolic parameters were normal.  2. Right ventricular systolic function is normal. The right ventricular size is normal.  3. The mitral valve is normal in structure. Trivial mitral valve regurgitation.  4. The aortic valve is tricuspid. There is mild thickening of the aortic valve. Aortic valve regurgitation is trivial.  5. Aortic dilatation noted. There is mild dilatation of the ascending aorta, measuring 44 mm.  6. The inferior vena cava is normal in size with greater than 50% respiratory variability, suggesting right atrial pressure of 3 mmHg. FINDINGS  Left Ventricle: Left ventricular ejection fraction, by estimation, is 60 to 65%. The left ventricle has normal function. The left ventricle has no regional wall motion abnormalities. The left ventricular internal cavity size was normal in size. There is  mild left ventricular hypertrophy. Left ventricular diastolic parameters were normal. Right Ventricle: The right ventricular size is normal. No increase in right ventricular wall thickness. Right ventricular systolic function is normal. Left Atrium: Left atrial size was normal in size. Right Atrium: Right atrial size was normal in size. Pericardium: There is no evidence of pericardial effusion. Mitral Valve: The mitral valve is normal in structure. Trivial mitral valve regurgitation. Tricuspid Valve: The tricuspid valve is normal in structure. Tricuspid valve regurgitation is trivial. Aortic Valve: The aortic valve is tricuspid. There  is mild thickening of the aortic valve. Aortic valve regurgitation is trivial. Pulmonic Valve: The pulmonic valve was not well visualized. Pulmonic valve regurgitation is not visualized. Aorta: Aortic dilatation noted. There is mild dilatation of the ascending aorta, measuring 44 mm. Venous: The inferior vena cava is normal in size with greater than  50% respiratory variability, suggesting right atrial pressure of 3 mmHg. IAS/Shunts: No atrial level shunt detected by color flow Doppler.  LEFT VENTRICLE PLAX 2D LVIDd:         5.00 cm   Diastology LVIDs:         3.00 cm   LV e' medial:    8.43 cm/s LV PW:         1.2 cm    LV E/e' medial:  8.4 LV IVS:        1.2 cm    LV e' lateral:   9.19 cm/s LVOT diam:     2.20 cm   LV E/e' lateral: 7.7 LV SV:         113 LV SV Index:   52 LVOT Area:     3.80 cm  RIGHT VENTRICLE             IVC RV Basal diam:  4.00 cm     IVC diam: 2.20 cm RV S prime:     22.30 cm/s TAPSE (M-mode): 2.3 cm LEFT ATRIUM             Index        RIGHT ATRIUM           Index LA diam:        4.90 cm 2.26 cm/m   RA Area:     14.20 cm LA Vol (A2C):   61.9 ml 28.53 ml/m  RA Volume:   37.50 ml  17.28 ml/m LA Vol (A4C):   34.3 ml 15.81 ml/m LA Biplane Vol: 48.8 ml 22.49 ml/m  AORTIC VALVE LVOT Vmax:   147.50 cm/s LVOT Vmean:  102.200 cm/s LVOT VTI:    0.296 m  AORTA Ao Root diam: 4.00 cm Ao Asc diam:  4.40 cm MITRAL VALVE MV Area (PHT): 2.84 cm    SHUNTS MV Decel Time: 268 msec    Systemic VTI:  0.30 m MV E velocity: 70.50 cm/s  Systemic Diam: 2.20 cm MV A velocity: 93.60 cm/s MV E/A ratio:  0.75 Mellody Drown Alluri Electronically signed by Windell Norfolk Signature Date/Time: 01/07/2023/1:40:05 PM    Final      Medications:    cefTRIAXone (ROCEPHIN)  IV Stopped (01/07/23 0944)   dialysis solution 1.5% low-MG/low-CA     dialysis solution 2.5% low-MG/low-CA     promethazine (PHENERGAN) injection (IM or IVPB) Stopped (01/07/23 1520)    atorvastatin  40 mg Oral q1800   gentamicin cream  1 Application Topical Daily   insulin aspart  0-6 Units Subcutaneous TID WC   melatonin  2.5 mg Oral QHS   metoprolol succinate  25 mg Oral Daily   pantoprazole  40 mg Oral Daily   sodium chloride flush  3 mL Intravenous Q12H   acetaminophen **OR** acetaminophen, albuterol, ondansetron (ZOFRAN) IV, promethazine (PHENERGAN) injection (IM or IVPB), sodium  chloride flush, traMADol  Assessment/ Plan:  Mr. VALERIO MATSUI is a 77 y.o.  male with past medical conditions including hypertension, stroke, diabetes, dyslipidemia, cholecystectomy, and end-stage renal disease on peritoneal dialysis.  Patient presents to the emergency department complaining of weakness, nausea and vomiting.  Patient has been admitted for  Pyelonephritis [N12] Nausea and vomiting [R11.2] Severe sepsis (HCC) [A41.9, R65.20] Atrial fibrillation, unspecified type (HCC) [I48.91]   End-stage renal disease on peritoneal dialysis.  Patient completes nightly dialysis 6 nights a week.  Patient reports completing full PD treatment overnight. Multiple alarms for slow drain, low flow. Reports UF .   2.  Sepsis, suspected secondary to pyelonephritis.  Urine culture positive for E-coli. Clinical exam appears less likely peritonitis.  Negative cell count. PD cultures negative. Antibiotics changed to IV ceftriaxone.   3. Anemia of chronic kidney disease Lab Results  Component Value Date   HGB 8.4 (L) 01/08/2023    Hemoglobin 8.4.  Patient receives monthly Mircera outpatient.   4.  Hypertension with chronic kidney disease.  Home regimen includes amlodipine, carvedilol, and torsemide.  Receiving metoprolol only  Blood pressure 129/78.  5. Secondary Hyperparathyroidism: with outpatient labs: PTH 765, phosphorus 5.8, calcium 8.9 on 12/09/22.   Lab Results  Component Value Date   CALCIUM 8.4 (L) 01/08/2023   PHOS 6.6 (H) 01/07/2023    Hyperphosphatemia noted. No binders. Will continue to manage with diet.    LOS: 3 Elianny Buxbaum 10/4/202412:18 PM

## 2023-01-09 DIAGNOSIS — N390 Urinary tract infection, site not specified: Secondary | ICD-10-CM | POA: Diagnosis not present

## 2023-01-09 DIAGNOSIS — A419 Sepsis, unspecified organism: Secondary | ICD-10-CM | POA: Diagnosis not present

## 2023-01-09 LAB — BODY FLUID CULTURE W GRAM STAIN: Culture: NO GROWTH

## 2023-01-09 LAB — BASIC METABOLIC PANEL
Anion gap: 11 (ref 5–15)
BUN: 71 mg/dL — ABNORMAL HIGH (ref 8–23)
CO2: 20 mmol/L — ABNORMAL LOW (ref 22–32)
Calcium: 8 mg/dL — ABNORMAL LOW (ref 8.9–10.3)
Chloride: 103 mmol/L (ref 98–111)
Creatinine, Ser: 10.36 mg/dL — ABNORMAL HIGH (ref 0.61–1.24)
GFR, Estimated: 5 mL/min — ABNORMAL LOW (ref >60)
Glucose, Bld: 115 mg/dL — ABNORMAL HIGH (ref 70–99)
Potassium: 3.6 mmol/L (ref 3.5–5.1)
Sodium: 134 mmol/L — ABNORMAL LOW (ref 135–145)

## 2023-01-09 LAB — CBC
HCT: 23.1 % — ABNORMAL LOW (ref 39.0–52.0)
Hemoglobin: 7.8 g/dL — ABNORMAL LOW (ref 13.0–17.0)
MCH: 31.6 pg (ref 26.0–34.0)
MCHC: 33.8 g/dL (ref 30.0–36.0)
MCV: 93.5 fL (ref 80.0–100.0)
Platelets: 82 10*3/uL — ABNORMAL LOW (ref 150–400)
RBC: 2.47 MIL/uL — ABNORMAL LOW (ref 4.22–5.81)
RDW: 13.7 % (ref 11.5–15.5)
WBC: 11.1 10*3/uL — ABNORMAL HIGH (ref 4.0–10.5)
nRBC: 0 % (ref 0.0–0.2)

## 2023-01-09 LAB — MAGNESIUM: Magnesium: 2 mg/dL (ref 1.7–2.4)

## 2023-01-09 MED ORDER — CEPHALEXIN 500 MG PO CAPS: 500.0000 mg | ORAL_CAPSULE | Freq: Two times a day (BID) | ORAL | 0 refills | Status: AC

## 2023-01-09 MED ORDER — APIXABAN 5 MG PO TABS: 5.0000 mg | ORAL_TABLET | Freq: Two times a day (BID) | ORAL | 2 refills | Status: AC

## 2023-01-09 MED ORDER — METOPROLOL SUCCINATE ER 25 MG PO TB24: 25.0000 mg | ORAL_TABLET | Freq: Every day | ORAL | 2 refills | Status: DC

## 2023-01-09 MED ORDER — FLUTICASONE PROPIONATE 50 MCG/ACT NA SUSP: 2.0000 | Freq: Every day | NASAL | Status: AC | PRN

## 2023-01-09 MED ORDER — APIXABAN 5 MG PO TABS
5.0000 mg | ORAL_TABLET | Freq: Two times a day (BID) | ORAL | Status: DC
  Administered 2023-01-09: 5 mg via ORAL
  Filled 2023-01-09: qty 1

## 2023-01-09 NOTE — Discharge Summary (Signed)
Physician Discharge Summary   Tyler Wiley  male DOB: [birth year]  VHQ:[id number]  PCP: Lauro Regulus, MD  Admit date: 01/05/2023 Discharge date: 01/09/2023  Admitted From: home Disposition:  home Wife updated at bedside prior to discharge.  CODE STATUS: Full code  Discharge Instructions     No wound care   Complete by: As directed       Hospital Course:  For full details, please see H&P, progress notes, consult notes and ancillary notes.  Briefly,  Tyler Wiley is a 77 year old male with past medical conditions including hypertension, stroke, diabetes, and end-stage renal disease on peritoneal dialysis.  Patient presented to the emergency department complaining of weakness, nausea and vomiting    Patient reports he was recently diagnosed with UTI and has been prescribed couple different antibiotics by PCP.  He still makes urine.   Septic shock due to recurrent Complicated UTI and pyelonephritis right side -- patient came in with lactate > 4,  elevated white count, tachycardia abnormal UA.  CT a/p showed "Mild right hydronephrosis and hydroureter with asymmetric right perinephric stranding." -- outpatient urine culture September 19 showed E. Coli -- this is patient's third episode of UTI. ID consultation with Dr. Joylene Draft --ruled out incomplete urinary emptying -- PD fluid negative for bacteria. Few WBC. Patient denies abdominal pain -- received IV fluids. --Urine cx grew E coli.  Pt received cefepime x2 days f/b ceftriaxone x3 days.  Pt discharged on cephalexin 500mg  PO BID for 9 more days (total 14 days of abx) --need urology f/u (had urolift done 5 years ago), already scheduled.   Atrial fibrillation with RVR-- new onset in the setting of sepsis -- cardiology consultation with Dr. Corky Sing --Started on Toprol 25 mg daily.  Home coreg d/c'ed. --home plavix d/c'ed --started on Eliquis   End-stage renal disease on peritoneal dialysis -- PD fluid negative for  bacteria -- nephrology consultation for in-house peritoneal dialysis --home torsemide resumed after discharge   type II diabetes --d/c'ed BG checks and SSI (pt doesn't want insulin, and currently no great need). --resume home regimen as below after discharge.   Anemia of chronic disease --Hgb 12.1 on presentation, gradually trended down to 7.8 prior to discharge.  Likely due to illness and blood draws.  No signs of bleeding.  acute thrombocytopenia -- suspect drop in platelet count due to sepsis.    Low Magnesium --monitored and supplemented PRN   CA stenosis/h/o CVA --on statins --home plavix d/c'ed after pt was started on Eliquis   OSA/Obesity --on CPAP   Unless noted above, medications under "STOP" list are ones pt was not taking PTA.  Discharge Diagnoses:  Principal Problem:   Nausea with vomiting Active Problems:   Severe sepsis (HCC)   Pyelonephritis   Electrolyte abnormality   HTN (hypertension)   Paroxysmal atrial fibrillation (HCC)   ESRD needing dialysis (HCC)   Gastritis   OSA (obstructive sleep apnea)   Type 2 diabetes mellitus with chronic kidney disease on chronic dialysis, without long-term current use of insulin (HCC)   Anemia of chronic kidney failure, unspecified stage   Hypomagnesemia   Hx of completed stroke   Atrial fibrillation (HCC)   Complicated UTI (urinary tract infection)   30 Day Unplanned Readmission Risk Score    Flowsheet Row ED to Hosp-Admission (Current) from 01/05/2023 in [facility]  30 Day Unplanned Readmission Risk Score (%) 19.92 Filed at 01/09/2023 0800       This score  Physician Discharge Summary   Tyler Wiley  male DOB: [birth year]  VHQ:[id number]  PCP: Lauro Regulus, MD  Admit date: 01/05/2023 Discharge date: 01/09/2023  Admitted From: home Disposition:  home Wife updated at bedside prior to discharge.  CODE STATUS: Full code  Discharge Instructions     No wound care   Complete by: As directed       Hospital Course:  For full details, please see H&P, progress notes, consult notes and ancillary notes.  Briefly,  Tyler Wiley is a 77 year old male with past medical conditions including hypertension, stroke, diabetes, and end-stage renal disease on peritoneal dialysis.  Patient presented to the emergency department complaining of weakness, nausea and vomiting    Patient reports he was recently diagnosed with UTI and has been prescribed couple different antibiotics by PCP.  He still makes urine.   Septic shock due to recurrent Complicated UTI and pyelonephritis right side -- patient came in with lactate > 4,  elevated white count, tachycardia abnormal UA.  CT a/p showed "Mild right hydronephrosis and hydroureter with asymmetric right perinephric stranding." -- outpatient urine culture September 19 showed E. Coli -- this is patient's third episode of UTI. ID consultation with Dr. Joylene Draft --ruled out incomplete urinary emptying -- PD fluid negative for bacteria. Few WBC. Patient denies abdominal pain -- received IV fluids. --Urine cx grew E coli.  Pt received cefepime x2 days f/b ceftriaxone x3 days.  Pt discharged on cephalexin 500mg  PO BID for 9 more days (total 14 days of abx) --need urology f/u (had urolift done 5 years ago), already scheduled.   Atrial fibrillation with RVR-- new onset in the setting of sepsis -- cardiology consultation with Dr. Corky Sing --Started on Toprol 25 mg daily.  Home coreg d/c'ed. --home plavix d/c'ed --started on Eliquis   End-stage renal disease on peritoneal dialysis -- PD fluid negative for  bacteria -- nephrology consultation for in-house peritoneal dialysis --home torsemide resumed after discharge   type II diabetes --d/c'ed BG checks and SSI (pt doesn't want insulin, and currently no great need). --resume home regimen as below after discharge.   Anemia of chronic disease --Hgb 12.1 on presentation, gradually trended down to 7.8 prior to discharge.  Likely due to illness and blood draws.  No signs of bleeding.  acute thrombocytopenia -- suspect drop in platelet count due to sepsis.    Low Magnesium --monitored and supplemented PRN   CA stenosis/h/o CVA --on statins --home plavix d/c'ed after pt was started on Eliquis   OSA/Obesity --on CPAP   Unless noted above, medications under "STOP" list are ones pt was not taking PTA.  Discharge Diagnoses:  Principal Problem:   Nausea with vomiting Active Problems:   Severe sepsis (HCC)   Pyelonephritis   Electrolyte abnormality   HTN (hypertension)   Paroxysmal atrial fibrillation (HCC)   ESRD needing dialysis (HCC)   Gastritis   OSA (obstructive sleep apnea)   Type 2 diabetes mellitus with chronic kidney disease on chronic dialysis, without long-term current use of insulin (HCC)   Anemia of chronic kidney failure, unspecified stage   Hypomagnesemia   Hx of completed stroke   Atrial fibrillation (HCC)   Complicated UTI (urinary tract infection)   30 Day Unplanned Readmission Risk Score    Flowsheet Row ED to Hosp-Admission (Current) from 01/05/2023 in [facility]  30 Day Unplanned Readmission Risk Score (%) 19.92 Filed at 01/09/2023 0800       This score  Physician Discharge Summary   Tyler Wiley  male DOB: [birth year]  VHQ:[id number]  PCP: Lauro Regulus, MD  Admit date: 01/05/2023 Discharge date: 01/09/2023  Admitted From: home Disposition:  home Wife updated at bedside prior to discharge.  CODE STATUS: Full code  Discharge Instructions     No wound care   Complete by: As directed       Hospital Course:  For full details, please see H&P, progress notes, consult notes and ancillary notes.  Briefly,  Tyler Wiley is a 77 year old male with past medical conditions including hypertension, stroke, diabetes, and end-stage renal disease on peritoneal dialysis.  Patient presented to the emergency department complaining of weakness, nausea and vomiting    Patient reports he was recently diagnosed with UTI and has been prescribed couple different antibiotics by PCP.  He still makes urine.   Septic shock due to recurrent Complicated UTI and pyelonephritis right side -- patient came in with lactate > 4,  elevated white count, tachycardia abnormal UA.  CT a/p showed "Mild right hydronephrosis and hydroureter with asymmetric right perinephric stranding." -- outpatient urine culture September 19 showed E. Coli -- this is patient's third episode of UTI. ID consultation with Dr. Joylene Draft --ruled out incomplete urinary emptying -- PD fluid negative for bacteria. Few WBC. Patient denies abdominal pain -- received IV fluids. --Urine cx grew E coli.  Pt received cefepime x2 days f/b ceftriaxone x3 days.  Pt discharged on cephalexin 500mg  PO BID for 9 more days (total 14 days of abx) --need urology f/u (had urolift done 5 years ago), already scheduled.   Atrial fibrillation with RVR-- new onset in the setting of sepsis -- cardiology consultation with Dr. Corky Sing --Started on Toprol 25 mg daily.  Home coreg d/c'ed. --home plavix d/c'ed --started on Eliquis   End-stage renal disease on peritoneal dialysis -- PD fluid negative for  bacteria -- nephrology consultation for in-house peritoneal dialysis --home torsemide resumed after discharge   type II diabetes --d/c'ed BG checks and SSI (pt doesn't want insulin, and currently no great need). --resume home regimen as below after discharge.   Anemia of chronic disease --Hgb 12.1 on presentation, gradually trended down to 7.8 prior to discharge.  Likely due to illness and blood draws.  No signs of bleeding.  acute thrombocytopenia -- suspect drop in platelet count due to sepsis.    Low Magnesium --monitored and supplemented PRN   CA stenosis/h/o CVA --on statins --home plavix d/c'ed after pt was started on Eliquis   OSA/Obesity --on CPAP   Unless noted above, medications under "STOP" list are ones pt was not taking PTA.  Discharge Diagnoses:  Principal Problem:   Nausea with vomiting Active Problems:   Severe sepsis (HCC)   Pyelonephritis   Electrolyte abnormality   HTN (hypertension)   Paroxysmal atrial fibrillation (HCC)   ESRD needing dialysis (HCC)   Gastritis   OSA (obstructive sleep apnea)   Type 2 diabetes mellitus with chronic kidney disease on chronic dialysis, without long-term current use of insulin (HCC)   Anemia of chronic kidney failure, unspecified stage   Hypomagnesemia   Hx of completed stroke   Atrial fibrillation (HCC)   Complicated UTI (urinary tract infection)   30 Day Unplanned Readmission Risk Score    Flowsheet Row ED to Hosp-Admission (Current) from 01/05/2023 in [facility]  30 Day Unplanned Readmission Risk Score (%) 19.92 Filed at 01/09/2023 0800       This score  Physician Discharge Summary   Tyler Wiley  male DOB: [birth year]  VHQ:[id number]  PCP: Lauro Regulus, MD  Admit date: 01/05/2023 Discharge date: 01/09/2023  Admitted From: home Disposition:  home Wife updated at bedside prior to discharge.  CODE STATUS: Full code  Discharge Instructions     No wound care   Complete by: As directed       Hospital Course:  For full details, please see H&P, progress notes, consult notes and ancillary notes.  Briefly,  Tyler Wiley is a 77 year old male with past medical conditions including hypertension, stroke, diabetes, and end-stage renal disease on peritoneal dialysis.  Patient presented to the emergency department complaining of weakness, nausea and vomiting    Patient reports he was recently diagnosed with UTI and has been prescribed couple different antibiotics by PCP.  He still makes urine.   Septic shock due to recurrent Complicated UTI and pyelonephritis right side -- patient came in with lactate > 4,  elevated white count, tachycardia abnormal UA.  CT a/p showed "Mild right hydronephrosis and hydroureter with asymmetric right perinephric stranding." -- outpatient urine culture September 19 showed E. Coli -- this is patient's third episode of UTI. ID consultation with Dr. Joylene Draft --ruled out incomplete urinary emptying -- PD fluid negative for bacteria. Few WBC. Patient denies abdominal pain -- received IV fluids. --Urine cx grew E coli.  Pt received cefepime x2 days f/b ceftriaxone x3 days.  Pt discharged on cephalexin 500mg  PO BID for 9 more days (total 14 days of abx) --need urology f/u (had urolift done 5 years ago), already scheduled.   Atrial fibrillation with RVR-- new onset in the setting of sepsis -- cardiology consultation with Dr. Corky Sing --Started on Toprol 25 mg daily.  Home coreg d/c'ed. --home plavix d/c'ed --started on Eliquis   End-stage renal disease on peritoneal dialysis -- PD fluid negative for  bacteria -- nephrology consultation for in-house peritoneal dialysis --home torsemide resumed after discharge   type II diabetes --d/c'ed BG checks and SSI (pt doesn't want insulin, and currently no great need). --resume home regimen as below after discharge.   Anemia of chronic disease --Hgb 12.1 on presentation, gradually trended down to 7.8 prior to discharge.  Likely due to illness and blood draws.  No signs of bleeding.  acute thrombocytopenia -- suspect drop in platelet count due to sepsis.    Low Magnesium --monitored and supplemented PRN   CA stenosis/h/o CVA --on statins --home plavix d/c'ed after pt was started on Eliquis   OSA/Obesity --on CPAP   Unless noted above, medications under "STOP" list are ones pt was not taking PTA.  Discharge Diagnoses:  Principal Problem:   Nausea with vomiting Active Problems:   Severe sepsis (HCC)   Pyelonephritis   Electrolyte abnormality   HTN (hypertension)   Paroxysmal atrial fibrillation (HCC)   ESRD needing dialysis (HCC)   Gastritis   OSA (obstructive sleep apnea)   Type 2 diabetes mellitus with chronic kidney disease on chronic dialysis, without long-term current use of insulin (HCC)   Anemia of chronic kidney failure, unspecified stage   Hypomagnesemia   Hx of completed stroke   Atrial fibrillation (HCC)   Complicated UTI (urinary tract infection)   30 Day Unplanned Readmission Risk Score    Flowsheet Row ED to Hosp-Admission (Current) from 01/05/2023 in [facility]  30 Day Unplanned Readmission Risk Score (%) 19.92 Filed at 01/09/2023 0800       This score  Physician Discharge Summary   Tyler Wiley  male DOB: [birth year]  VHQ:[id number]  PCP: Lauro Regulus, MD  Admit date: 01/05/2023 Discharge date: 01/09/2023  Admitted From: home Disposition:  home Wife updated at bedside prior to discharge.  CODE STATUS: Full code  Discharge Instructions     No wound care   Complete by: As directed       Hospital Course:  For full details, please see H&P, progress notes, consult notes and ancillary notes.  Briefly,  Tyler Wiley is a 77 year old male with past medical conditions including hypertension, stroke, diabetes, and end-stage renal disease on peritoneal dialysis.  Patient presented to the emergency department complaining of weakness, nausea and vomiting    Patient reports he was recently diagnosed with UTI and has been prescribed couple different antibiotics by PCP.  He still makes urine.   Septic shock due to recurrent Complicated UTI and pyelonephritis right side -- patient came in with lactate > 4,  elevated white count, tachycardia abnormal UA.  CT a/p showed "Mild right hydronephrosis and hydroureter with asymmetric right perinephric stranding." -- outpatient urine culture September 19 showed E. Coli -- this is patient's third episode of UTI. ID consultation with Dr. Joylene Draft --ruled out incomplete urinary emptying -- PD fluid negative for bacteria. Few WBC. Patient denies abdominal pain -- received IV fluids. --Urine cx grew E coli.  Pt received cefepime x2 days f/b ceftriaxone x3 days.  Pt discharged on cephalexin 500mg  PO BID for 9 more days (total 14 days of abx) --need urology f/u (had urolift done 5 years ago), already scheduled.   Atrial fibrillation with RVR-- new onset in the setting of sepsis -- cardiology consultation with Dr. Corky Sing --Started on Toprol 25 mg daily.  Home coreg d/c'ed. --home plavix d/c'ed --started on Eliquis   End-stage renal disease on peritoneal dialysis -- PD fluid negative for  bacteria -- nephrology consultation for in-house peritoneal dialysis --home torsemide resumed after discharge   type II diabetes --d/c'ed BG checks and SSI (pt doesn't want insulin, and currently no great need). --resume home regimen as below after discharge.   Anemia of chronic disease --Hgb 12.1 on presentation, gradually trended down to 7.8 prior to discharge.  Likely due to illness and blood draws.  No signs of bleeding.  acute thrombocytopenia -- suspect drop in platelet count due to sepsis.    Low Magnesium --monitored and supplemented PRN   CA stenosis/h/o CVA --on statins --home plavix d/c'ed after pt was started on Eliquis   OSA/Obesity --on CPAP   Unless noted above, medications under "STOP" list are ones pt was not taking PTA.  Discharge Diagnoses:  Principal Problem:   Nausea with vomiting Active Problems:   Severe sepsis (HCC)   Pyelonephritis   Electrolyte abnormality   HTN (hypertension)   Paroxysmal atrial fibrillation (HCC)   ESRD needing dialysis (HCC)   Gastritis   OSA (obstructive sleep apnea)   Type 2 diabetes mellitus with chronic kidney disease on chronic dialysis, without long-term current use of insulin (HCC)   Anemia of chronic kidney failure, unspecified stage   Hypomagnesemia   Hx of completed stroke   Atrial fibrillation (HCC)   Complicated UTI (urinary tract infection)   30 Day Unplanned Readmission Risk Score    Flowsheet Row ED to Hosp-Admission (Current) from 01/05/2023 in [facility]  30 Day Unplanned Readmission Risk Score (%) 19.92 Filed at 01/09/2023 0800       This score  from this hospitalization (including imaging, microbiology, ancillary and laboratory) are listed below for reference.   Consultations:   Procedures/Studies: ECHOCARDIOGRAM COMPLETE  Result Date: 01/07/2023    ECHOCARDIOGRAM REPORT   Patient Name:   ALMUS WOODHAM Date of Exam: 01/06/2023 Medical Rec #:  [id number]     Height:       71.0 in Accession #:    [id number]    Weight:       214.0 lb Date of Birth:  [birth year]      BSA:          2.170 m Patient Age:    77 years      BP:           131/62 mmHg Patient Gender: M             HR:           92 bpm. Exam Location:  [facility] Procedure: 2D Echo, Cardiac Doppler and Color Doppler Indications:     I48.91 Atrial Fibrillation  History:         Patient has prior history of Echocardiogram examinations, most                  recent 05/07/2018. CAD, Stroke; Risk Factors:Hypertension and                  Diabetes.  Sonographer:     Daphine Deutscher RDCS Referring Phys:  [id number] EKTA V PATEL Diagnosing Phys: Mellody Drown Alluri IMPRESSIONS  1. Left ventricular ejection fraction, by estimation, is 60 to 65%. The left ventricle has normal function. The left ventricle has no regional wall  motion abnormalities. There is mild left ventricular hypertrophy. Left ventricular diastolic parameters were normal.  2. Right ventricular systolic function is normal. The right ventricular size is normal.  3. The mitral valve is normal in structure. Trivial mitral valve regurgitation.  4. The aortic valve is tricuspid. There is mild thickening of the aortic valve. Aortic valve regurgitation is trivial.  5. Aortic dilatation noted. There is mild dilatation of the ascending aorta, measuring 44 mm.  6. The inferior vena cava is normal in size with greater than 50% respiratory variability, suggesting right atrial pressure of 3 mmHg. FINDINGS  Left Ventricle: Left ventricular ejection fraction, by estimation, is 60 to 65%. The left ventricle has normal function. The left ventricle has no regional wall motion abnormalities. The left ventricular internal cavity size was normal in size. There is  mild left ventricular hypertrophy. Left ventricular diastolic parameters were normal. Right Ventricle: The right ventricular size is normal. No increase in right ventricular wall thickness. Right ventricular systolic function is normal. Left Atrium: Left atrial size was normal in size. Right Atrium: Right atrial size was normal in size. Pericardium: There is no evidence of pericardial effusion. Mitral Valve: The mitral valve is normal in structure. Trivial mitral valve regurgitation. Tricuspid Valve: The tricuspid valve is normal in structure. Tricuspid valve regurgitation is trivial. Aortic Valve: The aortic valve is tricuspid. There is mild thickening of the aortic valve. Aortic valve regurgitation is trivial. Pulmonic Valve: The pulmonic valve was not well visualized. Pulmonic valve regurgitation is not visualized. Aorta: Aortic dilatation noted. There is mild dilatation of the ascending aorta, measuring 44 mm. Venous: The inferior vena cava is normal in size with greater than 50% respiratory variability, suggesting right  atrial pressure of 3 mmHg. IAS/Shunts: No atrial level shunt detected by color flow Doppler.  LEFT VENTRICLE PLAX 2D LVIDd:  Physician Discharge Summary   Tyler Wiley  male DOB: [birth year]  VHQ:[id number]  PCP: Lauro Regulus, MD  Admit date: 01/05/2023 Discharge date: 01/09/2023  Admitted From: home Disposition:  home Wife updated at bedside prior to discharge.  CODE STATUS: Full code  Discharge Instructions     No wound care   Complete by: As directed       Hospital Course:  For full details, please see H&P, progress notes, consult notes and ancillary notes.  Briefly,  Tyler Wiley is a 77 year old male with past medical conditions including hypertension, stroke, diabetes, and end-stage renal disease on peritoneal dialysis.  Patient presented to the emergency department complaining of weakness, nausea and vomiting    Patient reports he was recently diagnosed with UTI and has been prescribed couple different antibiotics by PCP.  He still makes urine.   Septic shock due to recurrent Complicated UTI and pyelonephritis right side -- patient came in with lactate > 4,  elevated white count, tachycardia abnormal UA.  CT a/p showed "Mild right hydronephrosis and hydroureter with asymmetric right perinephric stranding." -- outpatient urine culture September 19 showed E. Coli -- this is patient's third episode of UTI. ID consultation with Dr. Joylene Draft --ruled out incomplete urinary emptying -- PD fluid negative for bacteria. Few WBC. Patient denies abdominal pain -- received IV fluids. --Urine cx grew E coli.  Pt received cefepime x2 days f/b ceftriaxone x3 days.  Pt discharged on cephalexin 500mg  PO BID for 9 more days (total 14 days of abx) --need urology f/u (had urolift done 5 years ago), already scheduled.   Atrial fibrillation with RVR-- new onset in the setting of sepsis -- cardiology consultation with Dr. Corky Sing --Started on Toprol 25 mg daily.  Home coreg d/c'ed. --home plavix d/c'ed --started on Eliquis   End-stage renal disease on peritoneal dialysis -- PD fluid negative for  bacteria -- nephrology consultation for in-house peritoneal dialysis --home torsemide resumed after discharge   type II diabetes --d/c'ed BG checks and SSI (pt doesn't want insulin, and currently no great need). --resume home regimen as below after discharge.   Anemia of chronic disease --Hgb 12.1 on presentation, gradually trended down to 7.8 prior to discharge.  Likely due to illness and blood draws.  No signs of bleeding.  acute thrombocytopenia -- suspect drop in platelet count due to sepsis.    Low Magnesium --monitored and supplemented PRN   CA stenosis/h/o CVA --on statins --home plavix d/c'ed after pt was started on Eliquis   OSA/Obesity --on CPAP   Unless noted above, medications under "STOP" list are ones pt was not taking PTA.  Discharge Diagnoses:  Principal Problem:   Nausea with vomiting Active Problems:   Severe sepsis (HCC)   Pyelonephritis   Electrolyte abnormality   HTN (hypertension)   Paroxysmal atrial fibrillation (HCC)   ESRD needing dialysis (HCC)   Gastritis   OSA (obstructive sleep apnea)   Type 2 diabetes mellitus with chronic kidney disease on chronic dialysis, without long-term current use of insulin (HCC)   Anemia of chronic kidney failure, unspecified stage   Hypomagnesemia   Hx of completed stroke   Atrial fibrillation (HCC)   Complicated UTI (urinary tract infection)   30 Day Unplanned Readmission Risk Score    Flowsheet Row ED to Hosp-Admission (Current) from 01/05/2023 in [facility]GERY  30 Day Unplanned Readmission Risk Score (%) 19.92 Filed at 01/09/2023 0800       This score

## 2023-01-09 NOTE — Plan of Care (Signed)
The patient has been discharged. IV has been removed. Education has been completed with the patient. The patient has been wheeled down to his car.  Problem: Education: Goal: Ability to describe self-care measures that may prevent or decrease complications (Diabetes Survival Skills Education) will improve 01/09/2023 1157 by Murriel Hopper, Donald Pore, RN Outcome: Completed/Met 01/09/2023 1157 by Murriel Hopper, Donald Pore, RN Outcome: Progressing Goal: Individualized Educational Video(s) 01/09/2023 1157 by Murriel Hopper, Donald Pore, RN Outcome: Completed/Met 01/09/2023 1157 by Murriel Hopper, Donald Pore, RN Outcome: Progressing   Problem: Coping: Goal: Ability to adjust to condition or change in health will improve 01/09/2023 1157 by Murriel Hopper, Donald Pore, RN Outcome: Completed/Met 01/09/2023 1157 by Murriel Hopper, Donald Pore, RN Outcome: Progressing   Problem: Fluid Volume: Goal: Ability to maintain a balanced intake and output will improve 01/09/2023 1157 by Murriel Hopper, Donald Pore, RN Outcome: Completed/Met 01/09/2023 1157 by Murriel Hopper, Donald Pore, RN Outcome: Progressing   Problem: Health Behavior/Discharge Planning: Goal: Ability to identify and utilize available resources and services will improve 01/09/2023 1157 by Murriel Hopper, Donald Pore, RN Outcome: Completed/Met 01/09/2023 1157 by Murriel Hopper, Donald Pore, RN Outcome: Progressing Goal: Ability to manage health-related needs will improve 01/09/2023 1157 by Murriel Hopper, Donald Pore, RN Outcome: Completed/Met 01/09/2023 1157 by Murriel Hopper, Donald Pore, RN Outcome: Progressing   Problem: Metabolic: Goal: Ability to maintain appropriate glucose levels will improve 01/09/2023 1157 by Murriel Hopper, Donald Pore, RN Outcome: Completed/Met 01/09/2023 1157 by Murriel Hopper, Donald Pore, RN Outcome: Progressing   Problem: Nutritional: Goal: Maintenance of adequate nutrition will improve 01/09/2023 1157 by Murriel Hopper, Donald Pore, RN Outcome:  Completed/Met 01/09/2023 1157 by Murriel Hopper, Donald Pore, RN Outcome: Progressing Goal: Progress toward achieving an optimal weight will improve 01/09/2023 1157 by Murriel Hopper, Donald Pore, RN Outcome: Completed/Met 01/09/2023 1157 by Murriel Hopper, Donald Pore, RN Outcome: Progressing   Problem: Skin Integrity: Goal: Risk for impaired skin integrity will decrease 01/09/2023 1157 by Murriel Hopper, Donald Pore, RN Outcome: Completed/Met 01/09/2023 1157 by Murriel Hopper, Donald Pore, RN Outcome: Progressing   Problem: Tissue Perfusion: Goal: Adequacy of tissue perfusion will improve 01/09/2023 1157 by Monica Becton, RN Outcome: Completed/Met 01/09/2023 1157 by Murriel Hopper, Donald Pore, RN Outcome: Progressing   Problem: Fluid Volume: Goal: Hemodynamic stability will improve 01/09/2023 1157 by Monica Becton, RN Outcome: Completed/Met 01/09/2023 1157 by Murriel Hopper, Donald Pore, RN Outcome: Progressing   Problem: Clinical Measurements: Goal: Diagnostic test results will improve 01/09/2023 1157 by Monica Becton, RN Outcome: Completed/Met 01/09/2023 1157 by Murriel Hopper, Donald Pore, RN Outcome: Progressing Goal: Signs and symptoms of infection will decrease 01/09/2023 1157 by Murriel Hopper, Donald Pore, RN Outcome: Completed/Met 01/09/2023 1157 by Murriel Hopper, Donald Pore, RN Outcome: Progressing   Problem: Respiratory: Goal: Ability to maintain adequate ventilation will improve 01/09/2023 1157 by Monica Becton, RN Outcome: Completed/Met 01/09/2023 1157 by Murriel Hopper, Donald Pore, RN Outcome: Progressing   Problem: Education: Goal: Knowledge of General Education information will improve Description: Including pain rating scale, medication(s)/side effects and non-pharmacologic comfort measures 01/09/2023 1157 by Monica Becton, RN Outcome: Completed/Met 01/09/2023 1157 by Murriel Hopper, Donald Pore, RN Outcome: Progressing   Problem: Health  Behavior/Discharge Planning: Goal: Ability to manage health-related needs will improve 01/09/2023 1157 by Monica Becton, RN Outcome: Completed/Met 01/09/2023 1157 by Murriel Hopper, Donald Pore, RN Outcome: Progressing   Problem: Clinical Measurements: Goal: Ability to maintain clinical measurements within normal limits will improve 01/09/2023 1157 by Monica Becton, RN Outcome: Completed/Met 01/09/2023 1157 by Murriel Hopper, Donald Pore, RN Outcome: Progressing Goal: Will remain free from infection 01/09/2023 1157 by Monica Becton, RN Outcome:  Completed/Met 01/09/2023 1157 by Murriel Hopper, Donald Pore, RN Outcome: Progressing Goal: Diagnostic test results will improve 01/09/2023 1157 by Murriel Hopper, Donald Pore, RN Outcome: Completed/Met 01/09/2023 1157 by Murriel Hopper, Donald Pore, RN Outcome: Progressing Goal: Respiratory complications will improve 01/09/2023 1157 by Monica Becton, RN Outcome: Completed/Met 01/09/2023 1157 by Murriel Hopper, Donald Pore, RN Outcome: Progressing Goal: Cardiovascular complication will be avoided 01/09/2023 1157 by Monica Becton, RN Outcome: Completed/Met 01/09/2023 1157 by Monica Becton, RN Outcome: Progressing   Problem: Activity: Goal: Risk for activity intolerance will decrease 01/09/2023 1157 by Murriel Hopper, Donald Pore, RN Outcome: Completed/Met 01/09/2023 1157 by Murriel Hopper, Donald Pore, RN Outcome: Progressing   Problem: Nutrition: Goal: Adequate nutrition will be maintained 01/09/2023 1157 by Murriel Hopper, Donald Pore, RN Outcome: Completed/Met 01/09/2023 1157 by Murriel Hopper, Donald Pore, RN Outcome: Progressing   Problem: Coping: Goal: Level of anxiety will decrease 01/09/2023 1157 by Murriel Hopper, Donald Pore, RN Outcome: Completed/Met 01/09/2023 1157 by Murriel Hopper, Donald Pore, RN Outcome: Progressing   Problem: Elimination: Goal: Will not experience complications related to bowel  motility 01/09/2023 1157 by Murriel Hopper, Donald Pore, RN Outcome: Completed/Met 01/09/2023 1157 by Murriel Hopper, Donald Pore, RN Outcome: Progressing Goal: Will not experience complications related to urinary retention 01/09/2023 1157 by Monica Becton, RN Outcome: Completed/Met 01/09/2023 1157 by Murriel Hopper, Donald Pore, RN Outcome: Progressing   Problem: Pain Managment: Goal: General experience of comfort will improve 01/09/2023 1157 by Monica Becton, RN Outcome: Completed/Met 01/09/2023 1157 by Murriel Hopper, Donald Pore, RN Outcome: Progressing   Problem: Safety: Goal: Ability to remain free from injury will improve 01/09/2023 1157 by Murriel Hopper, Donald Pore, RN Outcome: Completed/Met 01/09/2023 1157 by Murriel Hopper, Donald Pore, RN Outcome: Progressing   Problem: Skin Integrity: Goal: Risk for impaired skin integrity will decrease 01/09/2023 1157 by Monica Becton, RN Outcome: Completed/Met 01/09/2023 1157 by Murriel Hopper, Donald Pore, RN Outcome: Progressing   Problem: Acute Rehab OT Goals (only OT should resolve) Goal: Pt. Will Perform Lower Body Dressing Outcome: Completed/Met Goal: Pt. Will Transfer To Toilet Outcome: Completed/Met Goal: Pt. Will Perform Toileting-Clothing Manipulation Outcome: Completed/Met   Problem: Acute Rehab PT Goals(only PT should resolve) Goal: Pt Will Ambulate Outcome: Completed/Met Goal: Pt Will Go Up/Down Stairs Outcome: Completed/Met Goal: Pt/caregiver will Perform Home Exercise Program Outcome: Completed/Met

## 2023-01-09 NOTE — Consult Note (Signed)
PHARMACY CONSULT NOTE - ELECTROLYTES  Pharmacy Consult for Electrolyte Monitoring and Replacement   Recent Labs: Height: 5\' 11"  (180.3 cm) Weight: 106.8 kg (235 lb 6.4 oz) IBW/kg (Calculated) : 75.3 Estimated Creatinine Clearance: 7.4 mL/min (A) (by C-G formula based on SCr of 10.36 mg/dL (H)). Potassium (mmol/L)  Date Value  01/09/2023 3.6  10/17/2012 4.2   Magnesium (mg/dL)  Date Value  16/01/9603 2.0  10/17/2012 1.9   Calcium (mg/dL)  Date Value  54/12/8117 8.0 (L)   Calcium, Total (mg/dL)  Date Value  14/78/2956 9.2   Albumin (g/dL)  Date Value  21/30/8657 3.9  10/17/2012 3.5   Phosphorus (mg/dL)  Date Value  84/69/6295 6.6 (H)   Sodium (mmol/L)  Date Value  01/09/2023 134 (L)  10/17/2012 135 (L)   Assessment  Tyler Wiley is a 77 y.o. male presenting with UTI and N/V. PMH significant for ESRD on PD, HTN, HLD, OSA, and CVA. Pharmacy has been consulted to monitor and replace electrolytes.  Diet: Renal  Goal of Therapy: Electrolytes WNL  Plan:  No electrolyte replacement indicated at this time Labs are stable overall. Consider decreasing frequency of lab checks to [phone]  Thank you for allowing pharmacy to be a part of this patient's care.  Bari Mantis PharmD Clinical Pharmacist 01/09/2023

## 2023-01-09 NOTE — Progress Notes (Signed)
PHARMACY - ANTICOAGULATION CONSULT NOTE  Pharmacy Consult for apixaban Indication: atrial fibrillation  Allergies  Allergen Reactions   Amlodipine Swelling   Cefuroxime     Other: Mouth ulcers, tongue swollen.  Tolerated Ancef.   Shrimp Extract     Sometimes breaks out in hives    Patient Measurements: Height: 5\' 11"  (180.3 cm) Weight: 106.8 kg (235 lb 6.4 oz) IBW/kg (Calculated) : 75.3 Heparin Dosing Weight:    Vital Signs: Temp: 98.9 F (37.2 C) (10/05 0818) Temp Source: Oral (10/05 0143) BP: 119/47 (10/05 0818) Pulse Rate: 56 (10/05 0818)  Labs: Recent Labs    01/06/23 1334 01/07/23 0445 01/07/23 0445 01/08/23 0339 01/09/23 0450  HGB  --  8.1*   < > 8.4* 7.8*  HCT  --  24.2*  --  24.1* 23.1*  PLT  --  80*  --  85* 82*  HEPARINUNFRC 0.53 0.44  --   --   --   CREATININE  --  9.65*  --  9.62* 10.36*   < > = values in this interval not displayed.    Estimated Creatinine Clearance: 7.4 mL/min (A) (by C-G formula based on SCr of 10.36 mg/dL (H)).   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 30 tablet 0 Past Week   carvedilol (COREG) 12.5 MG tablet Take 12.5 mg by mouth 2 (two) times daily with a meal.    01/05/2023   clopidogrel (PLAVIX) 75 MG tablet Take 75 mg by mouth every evening.    Past Week   clotrimazole-betamethasone (LOTRISONE) cream APPLY TO AFFECTED AREA TWICE A DAY 45 g 0 prn at unknown   glimepiride (AMARYL) 2 MG tablet  Take 2 mg by mouth daily with breakfast.   01/05/2023   pantoprazole (PROTONIX) 40 MG tablet Take 40 mg by mouth daily.   01/05/2023   torsemide (DEMADEX) 10 MG tablet Take 10 mg by mouth 2 (two) times daily.   Past Week   traMADol (ULTRAM) 50 MG tablet Take 50 mg by mouth every 8 (eight) hours as needed for moderate pain.      albuterol (VENTOLIN HFA) 108 (90 Base) MCG/ACT inhaler Inhale 1-2 puffs into the lungs every 4 (four) hours as needed for wheezing or shortness of breath. (Patient not taking: Reported on 01/06/2023)   Not Taking   amLODipine (NORVASC) 10 MG tablet Take 1 tablet (10 mg total) by mouth daily. (Patient not taking: Reported on 01/06/2023) 30 tablet 0 Not Taking   ciprofloxacin (CIPRO) 500 MG tablet Take 500 mg by mouth 2 (two) times daily. (Patient not taking: Reported on 01/06/2023)   Not Taking   doxycycline (VIBRAMYCIN) 100 MG capsule Take 1 capsule by mouth 2 (two) times daily. (Patient not taking: Reported on 01/06/2023)   Not Taking   fluorouracil (EFUDEX) 5 % cream Apply topically 2 (two) times daily. Starting October the  1st apply to the L nose BID x 7 days. (Patient not taking: Reported on 01/06/2023) 30 g 0 Not Taking   fluticasone (FLONASE) 50 MCG/ACT nasal spray Place 2 sprays into both nostrils daily. (Patient taking differently: Place 2 sprays into both nostrils daily as needed for allergies. ) 16 g 0    ondansetron (ZOFRAN-ODT) 8 MG disintegrating tablet Take 1-2 tablets (8-16 mg total) by mouth every 8 (eight) hours as needed for Nausea for up to 7 days      sitaGLIPtin (JANUVIA) 100 MG tablet Take 25 mg by mouth daily.       TRADJENTA 5 MG TABS tablet Take 5 mg by mouth daily. (Patient not taking: Reported on 01/06/2023)   Not Taking   Scheduled:   apixaban  5 mg Oral BID   atorvastatin  40 mg Oral q1800   gentamicin cream  1 Application Topical Daily   melatonin  2.5 mg Oral QHS   metoprolol succinate  25 mg Oral Daily   pantoprazole  40 mg Oral Daily   sodium  chloride flush  3 mL Intravenous Q12H   Infusions:   cefTRIAXone (ROCEPHIN)  IV 2 g (01/08/23 1238)   dialysis solution 1.5% low-MG/low-CA     dialysis solution 2.5% low-MG/low-CA     promethazine (PHENERGAN) injection (IM or IVPB) Stopped (01/07/23 1520)    Assessment: Tyler Wiley is a 77 yo male who presented to the ED with nausea, vomiting, light headedness and generally not feeling well. Pressures were low on arrival and found to be in new onset afib   Hgb 7.8  Plt 82  on Peritoneal dialysis  77 yo   wt 106.8kg  Goal of Therapy:  Monitor platelets by anticoagulation protocol: Yes   Plan:  Will order apixaban 5 mg po BID for Afib. ( See Cardiology note 01/07/23) F/u labs per protocol  Bari Mantis PharmD Clinical Pharmacist 01/09/2023

## 2023-01-09 NOTE — Plan of Care (Signed)
  Problem: Education: Goal: Ability to describe self-care measures that may prevent or decrease complications (Diabetes Survival Skills Education) will improve Outcome: Progressing Goal: Individualized Educational Video(s) Outcome: Progressing   Problem: Coping: Goal: Ability to adjust to condition or change in health will improve Outcome: Progressing   Problem: Fluid Volume: Goal: Ability to maintain a balanced intake and output will improve Outcome: Progressing   Problem: Health Behavior/Discharge Planning: Goal: Ability to identify and utilize available resources and services will improve Outcome: Progressing Goal: Ability to manage health-related needs will improve Outcome: Progressing   Problem: Metabolic: Goal: Ability to maintain appropriate glucose levels will improve Outcome: Progressing   Problem: Nutritional: Goal: Maintenance of adequate nutrition will improve Outcome: Progressing Goal: Progress toward achieving an optimal weight will improve Outcome: Progressing   Problem: Skin Integrity: Goal: Risk for impaired skin integrity will decrease Outcome: Progressing   Problem: Tissue Perfusion: Goal: Adequacy of tissue perfusion will improve Outcome: Progressing   Problem: Fluid Volume: Goal: Hemodynamic stability will improve Outcome: Progressing   Problem: Clinical Measurements: Goal: Diagnostic test results will improve Outcome: Progressing Goal: Signs and symptoms of infection will decrease Outcome: Progressing   Problem: Respiratory: Goal: Ability to maintain adequate ventilation will improve Outcome: Progressing   Problem: Education: Goal: Knowledge of General Education information will improve Description: Including pain rating scale, medication(s)/side effects and non-pharmacologic comfort measures Outcome: Progressing   Problem: Health Behavior/Discharge Planning: Goal: Ability to manage health-related needs will improve Outcome:  Progressing   Problem: Clinical Measurements: Goal: Ability to maintain clinical measurements within normal limits will improve Outcome: Progressing Goal: Will remain free from infection Outcome: Progressing Goal: Diagnostic test results will improve Outcome: Progressing Goal: Respiratory complications will improve Outcome: Progressing Goal: Cardiovascular complication will be avoided Outcome: Progressing   Problem: Activity: Goal: Risk for activity intolerance will decrease Outcome: Progressing   Problem: Nutrition: Goal: Adequate nutrition will be maintained Outcome: Progressing   Problem: Coping: Goal: Level of anxiety will decrease Outcome: Progressing   Problem: Elimination: Goal: Will not experience complications related to bowel motility Outcome: Progressing Goal: Will not experience complications related to urinary retention Outcome: Progressing   Problem: Pain Managment: Goal: General experience of comfort will improve Outcome: Progressing   Problem: Safety: Goal: Ability to remain free from injury will improve Outcome: Progressing

## 2023-01-09 NOTE — TOC CM/SW Note (Signed)
Occupational Therapy * Physical Therapy * Speech Therapy  DATE 01/09/23 PATIENT NAME Tyler Wiley. Boulden PATIENT MRN 08/29/1945  DIAGNOSIS/DIAGNOSIS CODE R11.2 DATE OF DISCHARGE 01/09/23  PRIMARY CARE PHYSICIAN Dr. Dareen Piano PCP PHONE/FAX [phone]    Dear Provider (Name: [facility] O[facility] Fax: [phone] ):   I certify that I have examined this patient and that occupational/physical/speech therapy is necessary on an outpatient basis.    The patient has expressed interest in completing their recommended course of therapy at your location.  Once a formal order from the patient's primary care physician has been obtained, please contact him/her to schedule an appointment for evaluation at your earliest convenience.  [ x ]  Physical Therapy Evaluate and Treat  [ x ]  Occupational Therapy Evaluate and Treat  The patient's primary care physician (listed above) must furnish and be responsible for a formal order such that the recommended services may be furnished while under the primary physician's care, and that the plan of care will be established and reviewed every 30 days (or more often if condition necessitates).

## 2023-01-09 NOTE — Discharge Instructions (Signed)
Referral was made to [facility] for Outpatient PT per your request, please call them if you do not hear from them to schedule an appt:  [facility]  [address]  [place], [place] [address] [phone]  [phone] (fax)

## 2023-01-09 NOTE — TOC Transition Note (Signed)
Transition of Care [facility]) - CM/SW Discharge Note   Patient Details  Name: Tyler Wiley MRN: [id number] Date of Birth: 05/27/1945  [facility] CM/SW Contact:  Liliana Cline, LCSW Phone Number: 01/09/2023, 11:00 AM   Clinical Narrative:    Patient to DC home today. Patient's wife reports they would like OPPT/OT at [facility]. Referral sent. No additional TOC needs identified.    Final next level of care: OP Rehab Barriers to Discharge: Barriers Resolved   Patient Goals and CMS Choice CMS Medicare.gov Compare Post Acute Care list provided to:: Patient Represenative (must comment) Choice offered to / list presented to : Spouse  Discharge Placement                      Patient and family notified of of transfer: 01/09/23  Discharge Plan and Services Additional resources added to the After Visit Summary for                                       Social Determinants of Health (SDOH) Interventions SDOH Screenings   Food Insecurity: No Food Insecurity (01/06/2023)  Housing: Low Risk  (01/06/2023)  Transportation Needs: No Transportation Needs (01/06/2023)  Utilities: Not At Risk (01/06/2023)  Financial Resource Strain: Low Risk  (12/10/2022)   Received from [facility]  Tobacco Use: Medium Risk (01/06/2023)     Readmission Risk Interventions     No data to display

## 2023-01-09 NOTE — Progress Notes (Signed)
[facility]  ROUNDING NOTE   Subjective:   Tyler Wiley is a 77 year old male with past medical conditions including hypertension, stroke, diabetes, dyslipidemia, cholecystectomy, and end-stage renal disease on peritoneal dialysis.  Patient presents to the emergency department complaining of weakness, nausea and vomiting.  Patient has been admitted for Pyelonephritis [N12] Nausea and vomiting [R11.2] Severe sepsis (HCC) [A41.9, R65.20] Atrial fibrillation, unspecified type [facility]) [I48.91]  Patient is known to our practice and receives peritoneal dialysis management through the [facility] clinic, supervised by Dr. Thedore Mins.    Patient seen laying in bed Wife at bedside Denies pain or discomfort   Objective:  Vital signs in last 24 hours:  Temp:  [98.3 F (36.8 C)-99 F (37.2 C)] 98.9 F (37.2 C) (10/05 0818) Pulse Rate:  [56-100] 56 (10/05 0818) Resp:  [16-20] 20 (10/05 0818) BP: (109-119)/(47-66) 119/47 (10/05 0818) SpO2:  [95 %-99 %] 97 % (10/05 0818) Weight:  [106.8 kg] 106.8 kg (10/05 0500)  Weight change: 4.777 kg Filed Weights   01/06/23 2030 01/08/23 0451 01/09/23 0500  Weight: 107.6 kg 102 kg 106.8 kg    Intake/Output: I/O last 3 completed shifts: In: 480.5 [P.O.:480; IV Piggyback:0.5] Out: 1447 [Urine:450; Other:997]   Intake/Output this shift:  Total I/O In: 240 [P.O.:240] Out: -   Physical Exam: General: NAD  Head: Normocephalic, atraumatic. Moist oral mucosal membranes  Eyes: Anicteric  Lungs:  Clear to auscultation, normal effort  Heart: Regular rate and rhythm  Abdomen:  Soft, nontender, mild distention  Extremities: No peripheral edema.  Neurologic: Alert and oriented, moving all four extremities  Skin: No lesions  Access: PD catheter    Basic Metabolic Panel: Recent Labs  Lab 01/05/23 1535 01/05/23 2004 01/06/23 1334 01/07/23 0445 01/08/23 0339 01/09/23 0450  NA 138  --   --  132* 133* 134*  K 4.4  --   --  3.5 3.6 3.6   CL 99  --   --  101 102 103  CO2 19*  --   --  17* 21* 20*  GLUCOSE 198*  --   --  148* 211* 115*  BUN 77*  --   --  75* 68* 71*  CREATININE 10.27*  --   --  9.65* 9.62* 10.36*  CALCIUM 9.0  --   --  8.3* 8.4* 8.0*  MG  --  1.3* 1.8 1.9 2.1 2.0  PHOS  --   --   --  6.6*  --   --     Liver Function Tests: Recent Labs  Lab 01/05/23 1535  AST 18  ALT 18  ALKPHOS 116  BILITOT 0.9  PROT 7.9  ALBUMIN 3.9   Recent Labs  Lab 01/05/23 1535  LIPASE 48   No results for input(s): "AMMONIA" in the last 168 hours.  CBC: Recent Labs  Lab 01/05/23 1535 01/06/23 0518 01/07/23 0445 01/08/23 0339 01/09/23 0450  WBC 13.0* 27.8* 16.8* 13.4* 11.1*  HGB 12.1* 9.4* 8.1* 8.4* 7.8*  HCT 36.9* 28.1* 24.2* 24.1* 23.1*  MCV 95.8 95.6 95.7 91.6 93.5  PLT 181 119* 80* 85* 82*    Cardiac Enzymes: No results for input(s): "CKTOTAL", "CKMB", "CKMBINDEX", "TROPONINI" in the last 168 hours.  BNP: Invalid input(s): "POCBNP"  CBG: Recent Labs  Lab 01/07/23 1728 01/07/23 2126 01/08/23 0742 01/08/23 1145 01/08/23 1719  GLUCAP 173* 208* 132* 225* 203*    Microbiology: Results for orders placed or performed during the hospital encounter of 01/05/23  Blood Culture (routine x  2)     Status: None (Preliminary result)   Collection Time: 01/05/23  4:37 PM   Specimen: BLOOD  Result Value Ref Range Status   Specimen Description BLOOD BLOOD RIGHT FOREARM  Final   Special Requests   Final    BOTTLES DRAWN AEROBIC AND ANAEROBIC Blood Culture adequate volume   Culture   Final    NO GROWTH 4 DAYS Performed at [facility], [address] [place], [place] [address]    Report Status PENDING  Incomplete  Blood Culture (routine x 2)     Status: None (Preliminary result)   Collection Time: 01/05/23  4:37 PM   Specimen: BLOOD  Result Value Ref Range Status   Specimen Description BLOOD LEFT ANTECUBITAL  Final   Special Requests   Final    BOTTLES DRAWN AEROBIC AND ANAEROBIC Blood  Culture adequate volume   Culture   Final    NO GROWTH 4 DAYS Performed at [facility], [address] [place], [place] [address]    Report Status PENDING  Incomplete  Body fluid culture w Gram Stain     Status: None   Collection Time: 01/05/23  6:55 PM   Specimen: Peritoneal Dialysate; Body Fluid  Result Value Ref Range Status   Specimen Description   Final    PERITONEAL DIALYSATE Performed at [facility], [address] [place], [place] [address]    Special Requests   Final    Immunocompromised Performed at [facility], [address] [place], [place] [address]    Gram Stain   Final    RARE WBC PRESENT, PREDOMINANTLY MONONUCLEAR NO ORGANISMS SEEN    Culture   Final    NO GROWTH 3 DAYS Performed at [facility], [address], [place], [place] [address]    Report Status 01/09/2023 FINAL  Final  Urine Culture     Status: Abnormal   Collection Time: 01/05/23 10:24 PM   Specimen: Urine, Random  Result Value Ref Range Status   Specimen Description   Final    URINE, RANDOM Performed at [facility], [address] [place], [place] [address]    Special Requests   Final    NONE Reflexed from [id number] Performed at [facility], [address] [place], [place] [address]    Culture >=100,000 COLONIES/mL ESCHERICHIA COLI (A)  Final   Report Status 01/08/2023 FINAL  Final   Organism ID, Bacteria ESCHERICHIA COLI (A)  Final      Susceptibility   Escherichia coli - MIC*    AMPICILLIN >=32 RESISTANT Resistant     CEFAZOLIN <=4 SENSITIVE Sensitive     CEFEPIME <=0.12 SENSITIVE Sensitive     CEFTRIAXONE <=0.25 SENSITIVE Sensitive     CIPROFLOXACIN <=0.25 SENSITIVE Sensitive     GENTAMICIN <=1 SENSITIVE Sensitive     IMIPENEM <=0.25 SENSITIVE Sensitive     NITROFURANTOIN <=16 SENSITIVE Sensitive     TRIMETH/SULFA >=320 RESISTANT Resistant     AMPICILLIN/SULBACTAM 16 INTERMEDIATE Intermediate     PIP/TAZO <=4 SENSITIVE  Sensitive     * >=100,000 COLONIES/mL ESCHERICHIA COLI  MRSA Next Gen by PCR, Nasal     Status: None   Collection Time: 01/06/23  8:00 AM   Specimen: Nasal Mucosa; Nasal Swab  Result Value Ref Range Status   MRSA by PCR Next Gen NOT DETECTED NOT DETECTED Final    Comment: (NOTE) The GeneXpert MRSA Assay (FDA approved for NASAL specimens only), is one component  of a comprehensive MRSA colonization surveillance program. It is not intended to diagnose MRSA infection nor to guide or monitor treatment for MRSA infections. Test performance is not FDA approved in patients less than 1 years old. Performed at [facility], [address] [place], [place] [address]     Coagulation Studies: No results for input(s): "LABPROT", "INR" in the last 72 hours.   Urinalysis: No results for input(s): "COLORURINE", "LABSPEC", "PHURINE", "GLUCOSEU", "HGBUR", "BILIRUBINUR", "KETONESUR", "PROTEINUR", "UROBILINOGEN", "NITRITE", "LEUKOCYTESUR" in the last 72 hours.  Invalid input(s): "APPERANCEUR"     Imaging: No results found.   Medications:    cefTRIAXone (ROCEPHIN)  IV 2 g (01/09/23 0914)   dialysis solution 1.5% low-MG/low-CA     dialysis solution 2.5% low-MG/low-CA     promethazine (PHENERGAN) injection (IM or IVPB) Stopped (01/07/23 1520)    apixaban  5 mg Oral BID   atorvastatin  40 mg Oral q1800   gentamicin cream  1 Application Topical Daily   melatonin  2.5 mg Oral QHS   metoprolol succinate  25 mg Oral Daily   pantoprazole  40 mg Oral Daily   sodium chloride flush  3 mL Intravenous Q12H   acetaminophen **OR** acetaminophen, albuterol, ondansetron (ZOFRAN) IV, promethazine (PHENERGAN) injection (IM or IVPB), sodium chloride flush, traMADol  Assessment/ Plan:  Mr. Tyler Wiley is a 77 y.o.  male with past medical conditions including hypertension, stroke, diabetes, dyslipidemia, cholecystectomy, and end-stage renal disease on peritoneal dialysis.  Patient presents to  the emergency department complaining of weakness, nausea and vomiting.  Patient has been admitted for Pyelonephritis [N12] Nausea and vomiting [R11.2] Severe sepsis (HCC) [A41.9, R65.20] Atrial fibrillation, unspecified type (HCC) [I48.91]   End-stage renal disease on peritoneal dialysis.  Patient completes nightly dialysis 6 nights a week.   - No PD received overnight and allow patient to rest - Recommend continuing Torsemide at discharge.   2.  Sepsis, suspected secondary to pyelonephritis.  Urine culture positive for E-coli. Clinical exam appears less likely peritonitis.  Negative cell count. PD cultures negative. Daily IV ceftriaxone.   3. Anemia of chronic kidney disease Lab Results  Component Value Date   HGB 7.8 (L) 01/09/2023    Hemoglobin 7.8.  Patient receives monthly Mircera outpatient.   4.  Hypertension with chronic kidney disease.  Home regimen includes amlodipine, carvedilol, and torsemide.  Receiving metoprolol only  Blood pressure stable. Continue Torsemide 10mg  BID  5. Secondary Hyperparathyroidism: with outpatient labs: PTH 765, phosphorus 5.8, calcium 8.9 on 12/09/22.   Lab Results  Component Value Date   CALCIUM 8.0 (L) 01/09/2023   PHOS 6.6 (H) 01/07/2023    Hyperphosphatemia noted. No binders.    LOS: 4 Marley Pakula 10/5/202411:15 AM

## 2023-01-10 LAB — CULTURE, BLOOD (ROUTINE X 2)
Culture: NO GROWTH
Culture: NO GROWTH
Special Requests: ADEQUATE
Special Requests: ADEQUATE

## 2023-02-05 ENCOUNTER — Ambulatory Visit: Payer: Medicare Other | Admitting: Urology

## 2023-02-10 ENCOUNTER — Encounter: Payer: Self-pay | Admitting: Urology

## 2023-02-10 ENCOUNTER — Ambulatory Visit: Payer: Medicare Other | Admitting: Urology

## 2023-02-10 VITALS — BP 144/83 | HR 71 | Ht 71.0 in | Wt 214.4 lb

## 2023-02-10 DIAGNOSIS — N400 Enlarged prostate without lower urinary tract symptoms: Secondary | ICD-10-CM | POA: Diagnosis not present

## 2023-02-10 DIAGNOSIS — N390 Urinary tract infection, site not specified: Secondary | ICD-10-CM

## 2023-02-10 DIAGNOSIS — N401 Enlarged prostate with lower urinary tract symptoms: Secondary | ICD-10-CM

## 2023-02-10 LAB — BLADDER SCAN AMB NON-IMAGING: Scan Result: 8

## 2023-02-10 NOTE — Progress Notes (Signed)
I, Maysun Anabel Bene, acting as a scribe for Riki Altes, MD., have documented all relevant documentation on the behalf of Riki Altes, MD, as directed by Riki Altes, MD while in the presence of Riki Altes, MD.  02/10/2023 2:32 PM   Tyler Wiley May 19, 1945 [id number]  Referring provider: Lauro Regulus, MD [address] [facility] [address] [place],  [place] [address]  Chief Complaint  Patient presents with   Other    HPI: Tyler Wiley is a 77 y.o. male referred for evaluation of recurrent UTIs.   Previously followed at [facility] in [place].  Underwent UroLift in 2020. He did have urinary retention post-procedure but subsequently had a successful voiding trial.  Presently notes occasional urinary hesitancy and urgency. He has had recurrent UTI/bacteria since January of 2024.  Positive urine cultures for E. coli January 2024, March 2024, September 2024 x3 and a positive urine culture for Pseudomonas 02/01/23. Hospitalized October 2024 for hypotension, nausea/vomiting, meeting the criteria for severe sepsis. Urine culture positive for E. coli during that hospitalization. CT showed mild right hydronephrosis without evidence of urinary retention or stone. He was seen by infectious disease during that hospitalization Previous symptoms have included lower abdominal/bladder discomfort and malodorous urine.  ESRD on peritoneal dialysis. He has no complaints today, but is on ciprofloxacin.    PMH: Past Medical History:  Diagnosis Date   Basal cell carcinoma 10/13/2021   L chin - tx with ED&C   CKD (chronic kidney disease) stage 4, GFR 15-29 ml/min (HCC)    Peritoneal dialysis   Diabetes mellitus without complication (HCC)    Gastritis    Hypertension    Renal disorder    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   L nose - LN2, 5FU/Calcipotriene cr   Stroke [facility])     Surgical History: Past Surgical History:  Procedure Laterality Date    CHOLECYSTECTOMY     CYSTOSCOPY WITH INSERTION OF UROLIFT N/A 01/20/2019   Procedure: CYSTOSCOPY WITH INSERTION OF UROLIFT;  Surgeon: Malen Gauze, MD;  Location: [facility];  Service: Urology;  Laterality: N/A;  30 MINS    Home Medications:  Allergies as of 02/10/2023       Reactions   Amlodipine Swelling   Cefuroxime    Other: Mouth ulcers, tongue swollen.  Tolerated Ancef.   Shrimp Extract    Sometimes breaks out in hives        Medication List        Accurate as of February 10, 2023  2:32 PM. If you have any questions, ask your nurse or doctor.          apixaban 5 MG Tabs tablet Commonly known as: ELIQUIS Take 1 tablet (5 mg total) by mouth 2 (two) times daily.   atorvastatin 40 MG tablet Commonly known as: LIPITOR Take 1 tablet (40 mg total) by mouth daily at 6 PM.   clotrimazole-betamethasone cream Commonly known as: LOTRISONE APPLY TO AFFECTED AREA TWICE A DAY   fluticasone 50 MCG/ACT nasal spray Commonly known as: Flonase Place 2 sprays into both nostrils daily as needed for allergies. Home med.   glimepiride 2 MG tablet Commonly known as: AMARYL Take 2 mg by mouth daily with breakfast.   metoprolol succinate 25 MG 24 hr tablet Commonly known as: TOPROL-XL Take 1 tablet (25 mg total) by mouth daily.   ondansetron 8 MG disintegrating tablet Commonly known as: ZOFRAN-ODT Take 1-2 tablets (8-16  mg total) by mouth every 8 (eight) hours as needed for Nausea for up to 7 days   pantoprazole 40 MG tablet Commonly known as: PROTONIX Take 40 mg by mouth daily.   sitaGLIPtin 100 MG tablet Commonly known as: JANUVIA Take 25 mg by mouth daily.   torsemide 10 MG tablet Commonly known as: DEMADEX Take 10 mg by mouth 2 (two) times daily.        Allergies:  Allergies  Allergen Reactions   Amlodipine Swelling   Cefuroxime     Other: Mouth ulcers, tongue swollen.  Tolerated Ancef.   Shrimp Extract     Sometimes breaks out in hives    Family  History: Family History  Problem Relation Age of Onset   CAD Father     Social History:  reports that he has quit smoking. His smoking use included cigarettes. He has never used smokeless tobacco. He reports current alcohol use. He reports that he does not use drugs.   Physical Exam: BP (!) 144/83   Pulse 71   Ht 5\' 11"  (1.803 m)   Wt 214 lb 6.4 oz (97.3 kg)   BMI 29.90 kg/m   Constitutional:  Alert, No acute distress. HEENT: [place] AT Respiratory: Normal respiratory effort, no increased work of breathing. Psychiatric: Normal mood and affect.   Pertinent Imaging: CT scan 01/05/2023 was personally reviewed and interpreted. There is mild right hydronephrosis/hydroureter to the UVJ. The bladder is not distended. No bladder calcifications are noted. UroLift implants all identified.  CT EXAM: CT ABDOMEN AND PELVIS WITHOUT CONTRAST   TECHNIQUE: Multidetector CT imaging of the abdomen and pelvis was performed following the standard protocol without IV contrast.   RADIATION DOSE REDUCTION: This exam was performed according to the departmental dose-optimization program which includes automated exposure control, adjustment of the mA and/or kV according to patient size and/or use of iterative reconstruction technique.   COMPARISON:  CT abdomen and pelvis dated October 17, 2012   FINDINGS: Lower chest: No acute abnormality.   Hepatobiliary: No focal liver abnormality is seen. Status post cholecystectomy. No biliary dilatation.   Pancreas: Unremarkable. No pancreatic ductal dilatation or surrounding inflammatory changes.   Spleen: Normal in size without focal abnormality.   Adrenals/Urinary Tract: Bilateral adrenal glands are unremarkable. Mild right hydronephrosis and hydroureter with asymmetric right perinephric stranding. No evidence of obstructing stone. Several cystic lesions of the right kidney are seen which are technically indeterminate, possibly due to streak artifact.  Reference mildly hyperattenuating lesion of the interpolar region measuring 3.5 cm. Additional bilateral renal lesions are seen which are consistent with simple cysts. Bladder is unremarkable.   Stomach/Bowel: Stomach is within normal limits. Appendix appears normal. No evidence of bowel wall thickening, distention, or inflammatory changes.   Vascular/Lymphatic: Aortic atherosclerosis. No enlarged abdominal or pelvic lymph nodes.   Reproductive: Fiducial markers noted in the prostate.   Other: Peritoneal dialysis catheter and trace abdominal ascites. No intra-abdominal free air.   Musculoskeletal: Diffuse osseous sclerosis, likely sequela of chronic renal disease.   IMPRESSION: 1. Mild right hydronephrosis and hydroureter with asymmetric right perinephric stranding. No evidence of obstructing stone. Findings may be due to recently passed stone or ascending infection. Recommend correlation with urinalysis. 2. Several cystic lesions of the right kidney are seen which are technically indeterminate, possibly due to streak artifact. Recommend further evaluation with nonemergent renal ultrasound. 3. Peritoneal dialysis catheter and trace abdominal ascites. 4. Aortic Atherosclerosis (ICD10-I70.0).     Electronically Signed   By: Tacey Ruiz  Strickland M.D.   On: 01/05/2023 19:07  Assessment & Plan:    1. Recurrent UTI Multiple positive cultures for E. coli with symptoms. Recent culture grew Pseudomonas.  PVR today was 8 mL.  Schedule cystoscopy for lower tract evaluation.    I have reviewed the above documentation for accuracy and completeness, and I agree with the above.   Riki Altes, MD  [facility] [address], [address] [place], [place] [address] [phone]

## 2023-02-11 ENCOUNTER — Encounter: Payer: Self-pay | Admitting: Urology

## 2023-03-01 ENCOUNTER — Encounter: Payer: Self-pay | Admitting: Urology

## 2023-03-01 NOTE — Telephone Encounter (Signed)
Thanks for talking with me on the phone. I am glad you are not having burning with urination or smell. See you on the 5th . Thanks

## 2023-03-11 ENCOUNTER — Encounter: Payer: Self-pay | Admitting: Urology

## 2023-03-11 ENCOUNTER — Ambulatory Visit: Payer: Medicare Other | Admitting: Urology

## 2023-03-11 VITALS — BP 146/72 | HR 61 | Ht 72.0 in | Wt 225.0 lb

## 2023-03-11 DIAGNOSIS — N390 Urinary tract infection, site not specified: Secondary | ICD-10-CM

## 2023-03-11 DIAGNOSIS — N400 Enlarged prostate without lower urinary tract symptoms: Secondary | ICD-10-CM

## 2023-03-11 DIAGNOSIS — R8271 Bacteriuria: Secondary | ICD-10-CM

## 2023-03-11 DIAGNOSIS — N3289 Other specified disorders of bladder: Secondary | ICD-10-CM | POA: Diagnosis not present

## 2023-03-11 DIAGNOSIS — N1330 Unspecified hydronephrosis: Secondary | ICD-10-CM | POA: Diagnosis not present

## 2023-03-11 DIAGNOSIS — N401 Enlarged prostate with lower urinary tract symptoms: Secondary | ICD-10-CM

## 2023-03-11 LAB — URINALYSIS, COMPLETE
Bilirubin, UA: NEGATIVE
Ketones, UA: NEGATIVE
Leukocytes,UA: NEGATIVE
Nitrite, UA: NEGATIVE
Specific Gravity, UA: 1.02 (ref 1.005–1.030)
Urobilinogen, Ur: 0.2 mg/dL (ref 0.2–1.0)
pH, UA: 6 (ref 5.0–7.5)

## 2023-03-11 LAB — MICROSCOPIC EXAMINATION

## 2023-03-11 NOTE — Progress Notes (Signed)
   03/11/23  CC:  Chief Complaint  Patient presents with   Cysto    HPI: Refer to my previous note 02/10/2023.  Started on Septra DS for MRSA bacteriuria by PCP last week.  UA today with 11-30 RBC/no pyuria  Blood pressure (!) 146/72, pulse 61, height 6' (1.829 m), weight 225 lb (102.1 kg). NED. A&Ox3.   No respiratory distress   Abd soft, NT, ND Normal phallus with bilateral descended testicles  Cystoscopy Procedure Note  Patient identification was confirmed, informed consent was obtained, and patient was prepped using Betadine solution.  Lidocaine jelly was administered per urethral meatus.     Pre-Procedure: - Inspection reveals a normal caliber urethral meatus.  Procedure: The flexible cystoscope was introduced without difficulty - No urethral strictures/lesions are present. -Moderate lateral lobe enlargement prostate  - Elevated bladder neck - Bilateral ureteral orifices identified - Bladder mucosa  reveals no ulcers, tumors, or lesions - No bladder stones - No trabeculation  Retroflexion shows no intravesical UroLift implants   Post-Procedure: - Patient tolerated the procedure well  Assessment/ Plan: Moderate BPH with bladder neck elevation No findings to suggest an etiology of his recurrent bacteriuria Prior UA in the ED did show mild right hydronephrosis without stone.  Will obtain a follow-up RUS    Riki Altes, MD

## 2023-03-23 ENCOUNTER — Ambulatory Visit
Admission: RE | Admit: 2023-03-23 | Discharge: 2023-03-23 | Disposition: A | Discharge status: Home / Self Care | Payer: Medicare Other | Source: Ambulatory Visit | Attending: Urology | Admitting: Urology

## 2023-03-23 DIAGNOSIS — N1330 Unspecified hydronephrosis: Secondary | ICD-10-CM | POA: Diagnosis present

## 2023-06-04 ENCOUNTER — Other Ambulatory Visit: Payer: Self-pay | Admitting: Urology

## 2023-06-29 ENCOUNTER — Ambulatory Visit: Admitting: Physician Assistant

## 2023-06-29 VITALS — BP 162/72 | HR 61 | Ht 72.0 in | Wt 225.0 lb

## 2023-06-29 DIAGNOSIS — N50812 Left testicular pain: Secondary | ICD-10-CM | POA: Diagnosis not present

## 2023-06-29 DIAGNOSIS — N50811 Right testicular pain: Secondary | ICD-10-CM

## 2023-06-29 LAB — URINALYSIS, COMPLETE
Bilirubin, UA: NEGATIVE
Ketones, UA: NEGATIVE
Leukocytes,UA: NEGATIVE
Nitrite, UA: NEGATIVE
Specific Gravity, UA: 1.025 (ref 1.005–1.030)
Urobilinogen, Ur: 0.2 mg/dL (ref 0.2–1.0)
pH, UA: 6.5 (ref 5.0–7.5)

## 2023-06-29 LAB — MICROSCOPIC EXAMINATION

## 2023-06-29 NOTE — Progress Notes (Signed)
 06/29/2023 5:15 PM   Tyler Wiley July 25, 1945 [id number]  CC: Chief Complaint  Patient presents with   Testicle Pain   HPI: Tyler Wiley is a 78 y.o. male with PMH ESRD on PD, BPH s/p UroLift in 2020, and recurrent UTI/bacteriuria who presents today for evaluation of testicular pain.  He is accompanied today by his wife, who contributes to HPI.   Today he reports chronic, intermittent testicular pain that responds to tramadol but interferes with his sleep.  It has been worse in the past 4 to 6 weeks.  Pain is bilateral but sometimes worse on the left.  He has chronic nausea, but denies fever or vomiting.  He has also had some mild intermittent dysuria consistent with past UTIs.  He denies testicular swelling.  PMH: Past Medical History:  Diagnosis Date   Basal cell carcinoma 10/13/2021   L chin - tx with ED&C   CKD (chronic kidney disease) stage 4, GFR 15-29 ml/min (HCC)    Peritoneal dialysis   Diabetes mellitus without complication (HCC)    Gastritis    Hypertension    Renal disorder    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   L nose - LN2, 5FU/Calcipotriene cr   Stroke [facility])     Surgical History: Past Surgical History:  Procedure Laterality Date   CHOLECYSTECTOMY     CYSTOSCOPY WITH INSERTION OF UROLIFT N/A 01/20/2019   Procedure: CYSTOSCOPY WITH INSERTION OF UROLIFT;  Surgeon: Malen Gauze, MD;  Location: [facility];  Service: Urology;  Laterality: N/A;  30 MINS    Home Medications:  Allergies as of 06/29/2023       Reactions   Amlodipine Swelling   Cefuroxime    Other: Mouth ulcers, tongue swollen.  Tolerated Ancef.   Shrimp Extract    Sometimes breaks out in hives        Medication List        Accurate as of June 29, 2023  5:15 PM. If you have any questions, ask your nurse or doctor.          STOP taking these medications    metoprolol succinate 25 MG 24 hr tablet Commonly known as: TOPROL-XL Stopped by: Carman Ching    sitaGLIPtin 100 MG tablet Commonly known as: JANUVIA Stopped by: Carman Ching       TAKE these medications    apixaban 5 MG Tabs tablet Commonly known as: ELIQUIS Take 1 tablet (5 mg total) by mouth 2 (two) times daily.   atorvastatin 40 MG tablet Commonly known as: LIPITOR Take 1 tablet (40 mg total) by mouth daily at 6 PM.   clotrimazole-betamethasone cream Commonly known as: LOTRISONE APPLY TO AFFECTED AREA TWICE A DAY   doxazosin 2 MG tablet Commonly known as: CARDURA Take 2 mg by mouth daily.   fluticasone 50 MCG/ACT nasal spray Commonly known as: Flonase Place 2 sprays into both nostrils daily as needed for allergies. Home med.   glimepiride 2 MG tablet Commonly known as: AMARYL Take 2 mg by mouth daily with breakfast.   ondansetron 8 MG disintegrating tablet Commonly known as: ZOFRAN-ODT Take 1-2 tablets (8-16 mg total) by mouth every 8 (eight) hours as needed for Nausea for up to 7 days   pantoprazole 40 MG tablet Commonly known as: PROTONIX Take 40 mg by mouth daily.   SULFAMETHOXAZOLE-TMP DS PO   torsemide 10 MG tablet Commonly known as: DEMADEX Take 10 mg by mouth 2 (two) times daily.  Allergies:  Allergies  Allergen Reactions   Amlodipine Swelling   Cefuroxime     Other: Mouth ulcers, tongue swollen.  Tolerated Ancef.   Shrimp Extract     Sometimes breaks out in hives    Family History: Family History  Problem Relation Age of Onset   CAD Father     Social History:   reports that he has quit smoking. His smoking use included cigarettes. He has never used smokeless tobacco. He reports current alcohol use. He reports that he does not use drugs.  Physical Exam: BP (!) 162/72   Pulse 61   Ht 6' (1.829 m)   Wt 225 lb (102.1 kg)   BMI 30.52 kg/m   Constitutional:  Alert and oriented, no acute distress, nontoxic appearing HEENT: [place], AT Cardiovascular: No clubbing, cyanosis, or edema Respiratory: Normal respiratory  effort, no increased work of breathing GU: Bilateral descended testicles.  Nonenlarged, nontender bilateral epididymides. Skin: No rashes, bruises or suspicious lesions Neurologic: Grossly intact, no focal deficits, moving all 4 extremities Psychiatric: Normal mood and affect  Assessment & Plan:   1. Pain in both testicles (Primary) Chronic, waxing and waning bilateral testicular pain, worse in the past 4 to 6 weeks associated with mild dysuria.  Physical exam with no evidence of epididymitis.  Will have him leave a UA on the way out today and contact him with results, treating for UTI if indicated. - Urinalysis, Complete - CULTURE, URINE COMPREHENSIVE  Return for Will call with results.  Carman Ching, PA-C  [facility] [place] [address], [address] [place], [place] [address] [phone]

## 2023-06-30 ENCOUNTER — Telehealth: Payer: Self-pay | Admitting: Physician Assistant

## 2023-06-30 ENCOUNTER — Other Ambulatory Visit: Payer: Self-pay

## 2023-06-30 MED ORDER — SULFAMETHOXAZOLE-TRIMETHOPRIM 400-80 MG PO TABS
1.0000 | ORAL_TABLET | Freq: Two times a day (BID) | ORAL | 0 refills | Status: AC
Start: 2023-06-30 — End: 2023-07-10

## 2023-06-30 NOTE — Telephone Encounter (Signed)
 Pt LM on triage line stating that he would like to know when and if an antibiotic is being sent to the pharmacy for him. He requests a call back.

## 2023-06-30 NOTE — Telephone Encounter (Signed)
 Pt would like to start abx and would like them sent to [facility] in [place].  He said he has tried to reach [place] through [employer].  He was in yesterday and had a u/a, culture.  He said he was asked if he would like to go ahead and start an antibiotic and he does.  He said as of lunch time today, nothing had been sent in.

## 2023-07-02 ENCOUNTER — Telehealth: Payer: Self-pay

## 2023-07-02 LAB — CULTURE, URINE COMPREHENSIVE

## 2023-07-02 NOTE — Telephone Encounter (Signed)
 it was negative. he may stop the antibiotics. Per sam notified patient. The patient understood

## 2023-07-02 NOTE — Telephone Encounter (Signed)
 Patient called and would like to know if the antibiotic sent to him was the correct antibiotic to treat his infection that he currently has. Please advise patient.

## 2023-09-09 ENCOUNTER — Emergency Department
Admission: EM | Admit: 2023-09-09 | Discharge: 2023-09-09 | Disposition: A | Discharge status: Home / Self Care | Attending: Emergency Medicine | Admitting: Emergency Medicine

## 2023-09-09 ENCOUNTER — Other Ambulatory Visit: Payer: Self-pay

## 2023-09-09 DIAGNOSIS — D72829 Elevated white blood cell count, unspecified: Secondary | ICD-10-CM | POA: Insufficient documentation

## 2023-09-09 DIAGNOSIS — Z7901 Long term (current) use of anticoagulants: Secondary | ICD-10-CM | POA: Diagnosis not present

## 2023-09-09 DIAGNOSIS — R55 Syncope and collapse: Secondary | ICD-10-CM | POA: Diagnosis present

## 2023-09-09 DIAGNOSIS — I120 Hypertensive chronic kidney disease with stage 5 chronic kidney disease or end stage renal disease: Secondary | ICD-10-CM | POA: Insufficient documentation

## 2023-09-09 DIAGNOSIS — D631 Anemia in chronic kidney disease: Secondary | ICD-10-CM | POA: Diagnosis not present

## 2023-09-09 DIAGNOSIS — E1122 Type 2 diabetes mellitus with diabetic chronic kidney disease: Secondary | ICD-10-CM | POA: Insufficient documentation

## 2023-09-09 DIAGNOSIS — Z992 Dependence on renal dialysis: Secondary | ICD-10-CM | POA: Insufficient documentation

## 2023-09-09 DIAGNOSIS — N186 End stage renal disease: Secondary | ICD-10-CM | POA: Diagnosis not present

## 2023-09-09 DIAGNOSIS — Z8673 Personal history of transient ischemic attack (TIA), and cerebral infarction without residual deficits: Secondary | ICD-10-CM | POA: Insufficient documentation

## 2023-09-09 LAB — URINALYSIS, ROUTINE W REFLEX MICROSCOPIC
Bilirubin Urine: NEGATIVE
Glucose, UA: 50 mg/dL — AB
Ketones, ur: NEGATIVE mg/dL
Nitrite: NEGATIVE
Protein, ur: >=300 mg/dL — AB
Specific Gravity, Urine: 1.015 (ref 1.005–1.030)
pH: 5 (ref 5.0–8.0)

## 2023-09-09 LAB — COMPREHENSIVE METABOLIC PANEL WITH GFR
ALT: 15 U/L (ref 0–44)
AST: 14 U/L — ABNORMAL LOW (ref 15–41)
Albumin: 3.3 g/dL — ABNORMAL LOW (ref 3.5–5.0)
Alkaline Phosphatase: 89 U/L (ref 38–126)
Anion gap: 14 (ref 5–15)
BUN: 56 mg/dL — ABNORMAL HIGH (ref 8–23)
CO2: 22 mmol/L (ref 22–32)
Calcium: 9.6 mg/dL (ref 8.9–10.3)
Chloride: 100 mmol/L (ref 98–111)
Creatinine, Ser: 10.44 mg/dL — ABNORMAL HIGH (ref 0.61–1.24)
GFR, Estimated: 5 mL/min — ABNORMAL LOW (ref >60)
Glucose, Bld: 252 mg/dL — ABNORMAL HIGH (ref 70–99)
Potassium: 3.7 mmol/L (ref 3.5–5.1)
Sodium: 136 mmol/L (ref 135–145)
Total Bilirubin: 0.6 mg/dL (ref 0.0–1.2)
Total Protein: 6.5 g/dL (ref 6.5–8.1)

## 2023-09-09 LAB — CBC
HCT: 30.2 % — ABNORMAL LOW (ref 39.0–52.0)
Hemoglobin: 10.2 g/dL — ABNORMAL LOW (ref 13.0–17.0)
MCH: 31.4 pg (ref 26.0–34.0)
MCHC: 33.8 g/dL (ref 30.0–36.0)
MCV: 92.9 fL (ref 80.0–100.0)
Platelets: 152 10*3/uL (ref 150–400)
RBC: 3.25 MIL/uL — ABNORMAL LOW (ref 4.22–5.81)
RDW: 14 % (ref 11.5–15.5)
WBC: 12.9 10*3/uL — ABNORMAL HIGH (ref 4.0–10.5)
nRBC: 0 % (ref 0.0–0.2)

## 2023-09-09 LAB — TROPONIN I (HIGH SENSITIVITY)
Troponin I (High Sensitivity): 34 ng/L — ABNORMAL HIGH (ref <18)
Troponin I (High Sensitivity): 31 ng/L — ABNORMAL HIGH (ref <18)

## 2023-09-09 NOTE — Discharge Instructions (Signed)
 Follow-up with your primary care provider.  Continue taking your normal medications as prescribed.  Return to the ER for new, worsening, or persistent severe weakness or dizziness, recurrent episodes of feeling like you are going to pass out, or any other new or worsening symptoms that concern you.

## 2023-09-09 NOTE — ED Triage Notes (Signed)
 Pt here via [facility] after a near syncopal episode. Pt went in to have his ears checked and then got faint. Pt blood pressure was low as well. Pt gets peritoneal dialysis. Pt states he feels better at the moment. Pt denies pain.

## 2023-09-09 NOTE — ED Provider Notes (Signed)
 [facility] Provider Note    Event Date/Time   First MD Initiated Contact with Patient 09/09/23 1216     (approximate)   History   Near Syncope   HPI  Tyler Wiley is a 78 y.o. male with history of atrial fibrillation on Eliquis , ESRD on PD, type 2 diabetes, anemia of chronic disease, hypertension, OSA, and CVA who presents with near syncope.  The patient states that he was at the ENT office and getting a hearing test when he suddenly started to feel lightheaded, saw stars, and then felt like he was about to pass out.  He did not actually lose consciousness.  He states that he is feeling better now.  He denies any nausea or vomiting, headache, chest pain, difficulty breathing, or fever.  He was in his usual state of health until this episode.  However he states that recently he has noted a few low blood pressure readings and has withheld one of his blood pressure medications.  I reviewed the past medical records.  The patient was admitted to the hospitalist service in October with septic shock due to complicated UTI and pyelonephritis.  His most recent outpatient visit with this cardiology on 3/31 for follow-up of his chronic conditions.   Physical Exam   Triage Vital Signs: ED Triage Vitals  Encounter Vitals Group     BP 09/09/23 1118 (!) 113/57     Systolic BP Percentile --      Diastolic BP Percentile --      Pulse Rate 09/09/23 1118 64     Resp 09/09/23 1118 18     Temp 09/09/23 1118 97.7 F (36.5 C)     Temp Source 09/09/23 1118 Oral     SpO2 09/09/23 1118 94 %     Weight 09/09/23 1119 225 lb 1.4 oz (102.1 kg)     Height 09/09/23 1119 6' (1.829 m)     Head Circumference --      Peak Flow --      Pain Score 09/09/23 1119 0     Pain Loc --      Pain Education --      Exclude from Growth Chart --     Most recent vital signs: Vitals:   09/09/23 1415 09/09/23 1445  BP:    Pulse: (!) 45   Resp: 19 15  Temp:    SpO2: 98%       General: Alert, well-appearing, no distress.  CV:  Good peripheral perfusion.  Normal heart sounds. Resp:  Normal effort.  Lungs CTAB. Abd:  No distention.  Other:  EOMI.  PERRLA.  Normal speech.  Motor intact in all extremities.  No significant peripheral edema.   ED Results / Procedures / Treatments   Labs (all labs ordered are listed, but only abnormal results are displayed) Labs Reviewed  COMPREHENSIVE METABOLIC PANEL WITH GFR - Abnormal; Notable for the following components:      Result Value   Glucose, Bld 252 (*)    BUN 56 (*)    Creatinine, Ser 10.44 (*)    Albumin 3.3 (*)    AST 14 (*)    GFR, Estimated 5 (*)    All other components within normal limits  CBC - Abnormal; Notable for the following components:   WBC 12.9 (*)    RBC 3.25 (*)    Hemoglobin 10.2 (*)    HCT 30.2 (*)    All other components within normal limits  URINALYSIS,  ROUTINE W REFLEX MICROSCOPIC - Abnormal; Notable for the following components:   Color, Urine YELLOW (*)    APPearance HAZY (*)    Glucose, UA 50 (*)    Hgb urine dipstick SMALL (*)    Protein, ur >=300 (*)    Leukocytes,Ua TRACE (*)    Bacteria, UA RARE (*)    All other components within normal limits  TROPONIN I (HIGH SENSITIVITY) - Abnormal; Notable for the following components:   Troponin I (High Sensitivity) 34 (*)    All other components within normal limits  TROPONIN I (HIGH SENSITIVITY) - Abnormal; Notable for the following components:   Troponin I (High Sensitivity) 31 (*)    All other components within normal limits  CBG MONITORING, ED     EKG  ED ECG REPORT I, Lind Repine, the attending physician, personally viewed and interpreted this ECG.  Date: 09/09/2023 EKG Time: 1117 Rate: 71 Rhythm: Atrial fibrillation QRS Axis: normal Intervals: normal ST/T Wave abnormalities: Nonspecific ST abnormalities Narrative Interpretation: no evidence of acute  ischemia    RADIOLOGY    PROCEDURES:  Critical Care performed: No  Procedures   MEDICATIONS ORDERED IN ED: Medications - No data to display   IMPRESSION / MDM / ASSESSMENT AND PLAN / ED COURSE  I reviewed the triage vital signs and the nursing notes.  78 year old male with PMH as noted above presents with near syncope today while at the ENT office.  The symptoms have now mostly resolved.  On exam his vital signs are normal except for borderline low blood pressure.  Neurologic exam is nonfocal.  The physical exam is otherwise unremarkable for acute findings.  EKG is nonischemic.  Differential diagnosis includes, but is not limited to, dehydration/hypovolemia, side effect of antihypertensive, orthostatic hypotension, UTI or other infection, less likely cardiac etiology.  We will obtain basic labs, cardiac enzymes, urinalysis, and reassess.  Patient's presentation is most consistent with acute presentation with potential threat to life or bodily function.  The patient is on the cardiac monitor to evaluate for evidence of arrhythmia and/or significant heart rate changes.   ----------------------------------------- 3:23 PM on 09/09/2023 -----------------------------------------  Lab workup is overall reassuring.  CMP shows creatinine of 10.4 which is consistent with the patient's baseline.  Electrolytes are unremarkable.  CBC shows mild leukocytosis and anemia both of which appear to be chronic.  Initial troponin is 34, consistent with a patient with ESRD.  Repeat is 31.  There is no evidence of ACS or other acute cardiac etiology of the patient's presentation.  Urinalysis shows a few WBCs but no convincing findings for UTI.  This also appears to be a chronic finding.  I considered whether the patient may benefit from inpatient admission given his age and comorbidities, and I offered this to the patient.  However, given that he did not have true syncope, his symptoms have improved, and  his workup is reassuring, he is appropriate for discharge home.  The patient himself would prefer to go home.  I counseled him on the results of the workup.  I gave strict return precautions, and he expressed understanding.   FINAL CLINICAL IMPRESSION(S) / ED DIAGNOSES   Final diagnoses:  Near syncope     Rx / DC Orders   ED Discharge Orders     None        Note:  This document was prepared using Dragon voice recognition software and may include unintentional dictation errors.    Lind Repine, MD 09/09/23 1525

## 2023-09-09 NOTE — ED Triage Notes (Signed)
 First Nurse Note: Patient to ED via [facility] from the ENT office. PT had his ears cleaned this AM. Pt had near syncope episode at office. PT does peritoneal dialysis at home.   105/83- 86/53 when standing 55 HR 94% RA

## 2023-09-23 ENCOUNTER — Ambulatory Visit: Payer: Medicare Other | Admitting: Dermatology

## 2023-10-06 ENCOUNTER — Ambulatory Visit: Admitting: Dermatology

## 2023-10-06 ENCOUNTER — Encounter: Payer: Self-pay | Admitting: Dermatology

## 2023-10-06 DIAGNOSIS — D692 Other nonthrombocytopenic purpura: Secondary | ICD-10-CM

## 2023-10-06 DIAGNOSIS — C44319 Basal cell carcinoma of skin of other parts of face: Secondary | ICD-10-CM | POA: Diagnosis not present

## 2023-10-06 DIAGNOSIS — W908XXA Exposure to other nonionizing radiation, initial encounter: Secondary | ICD-10-CM

## 2023-10-06 DIAGNOSIS — L03031 Cellulitis of right toe: Secondary | ICD-10-CM | POA: Diagnosis not present

## 2023-10-06 DIAGNOSIS — G629 Polyneuropathy, unspecified: Secondary | ICD-10-CM

## 2023-10-06 DIAGNOSIS — L03115 Cellulitis of right lower limb: Secondary | ICD-10-CM

## 2023-10-06 DIAGNOSIS — Z79899 Other long term (current) drug therapy: Secondary | ICD-10-CM

## 2023-10-06 DIAGNOSIS — Z8589 Personal history of malignant neoplasm of other organs and systems: Secondary | ICD-10-CM

## 2023-10-06 DIAGNOSIS — Z7189 Other specified counseling: Secondary | ICD-10-CM

## 2023-10-06 DIAGNOSIS — Z85828 Personal history of other malignant neoplasm of skin: Secondary | ICD-10-CM

## 2023-10-06 DIAGNOSIS — L578 Other skin changes due to chronic exposure to nonionizing radiation: Secondary | ICD-10-CM

## 2023-10-06 DIAGNOSIS — C4491 Basal cell carcinoma of skin, unspecified: Secondary | ICD-10-CM

## 2023-10-06 DIAGNOSIS — L03011 Cellulitis of right finger: Secondary | ICD-10-CM

## 2023-10-06 DIAGNOSIS — D492 Neoplasm of unspecified behavior of bone, soft tissue, and skin: Secondary | ICD-10-CM

## 2023-10-06 HISTORY — DX: Basal cell carcinoma of skin, unspecified: C44.91

## 2023-10-06 MED ORDER — MUPIROCIN 2 % EX OINT: 1.0000 | TOPICAL_OINTMENT | Freq: Every day | CUTANEOUS | 1 refills | Status: DC

## 2023-10-06 NOTE — Progress Notes (Signed)
 Follow-Up Visit   Subjective  Tyler Wiley is a 78 y.o. male who presents for the following: check spot L jaw, gets nicked by shaving, itchy prn, check R hand, R foot, R great toe, 3wks, started as a red dots Clindamycin 300mg  po tid x 10 days (pt was already on Cipro for UTI), salt water  soaks, pt does not think he has gotten bitten by anything, no cultures performed  Patient accompanied by wife who contributes to history.  The following portions of the chart were reviewed this encounter and updated as appropriate: medications, allergies, medical history  Review of Systems:  No other skin or systemic complaints except as noted in HPI or Assessment and Plan.  Objective  Well appearing patient in no apparent distress; mood and affect are within normal limits.  A focused examination was performed of the following areas: Face, hands, feet  Relevant exam findings are noted in the Assessment and Plan.        L mandible 2.2 x 1.5cm pearly plaque   Assessment & Plan   CELLULITIS vs ECTHYMA May have been precipitated with injury when cleaning feet vs trauma associated with neuropathy R 4th finger, R dorsum foot, R great toe Exam: crusted erosions R 4th finger, R dorsum foot, R great toe Treatment Plan: Patient has completed Clindamycin 300mg  10 day course and a Cipro course Discussed if recurrence recommend aerobic culture Start Mupirocin  oint qd to open sores right hand, right foot, R great toe  RAYNAUD'S vs NEUROPATHY  fingertips Exam: pinkness fingertips Treatment Plan: Observe Pt should discuss with his PCP  Purpura - Chronic; persistent and recurrent.  Treatable, but not curable. - Violaceous macules and patches - Benign - Related to trauma, age, sun damage and/or use of blood thinners, chronic use of topical and/or oral steroids - Observe - Can use OTC arnica containing moisturizer such as Dermend Bruise Formula if desired - Call for worsening or other  concerns  ACTINIC DAMAGE - chronic, secondary to cumulative UV radiation exposure/sun exposure over time - diffuse scaly erythematous macules with underlying dyspigmentation - Recommend daily broad spectrum sunscreen SPF 30+ to sun-exposed areas, reapply every 2 hours as needed.  - Recommend staying in the shade or wearing long sleeves, sun glasses (UVA+UVB protection) and wide brim hats (4-inch brim around the entire circumference of the hat). - Call for new or changing lesions.   HISTORY OF BASAL CELL CARCINOMA OF THE SKIN - No evidence of recurrence today - Recommend regular full body skin exams - Recommend daily broad spectrum sunscreen SPF 30+ to sun-exposed areas, reapply every 2 hours as needed.  - Call if any new or changing lesions are noted between office visits  - L chin  HISTORY OF SQUAMOUS CELL CARCINOMA OF THE SKIN - No evidence of recurrence today - No lymphadenopathy - Recommend regular full body skin exams - Recommend daily broad spectrum sunscreen SPF 30+ to sun-exposed areas, reapply every 2 hours as needed.  - Call if any new or changing lesions are noted between office visits - L nose  NEOPLASM OF SKIN L mandible Epidermal / dermal shaving  Lesion diameter (cm):  2.2 Informed consent: discussed and consent obtained   Timeout: patient name, date of birth, surgical site, and procedure verified   Procedure prep:  Patient was prepped and draped in usual sterile fashion Prep type:  Isopropyl alcohol Anesthesia: the lesion was anesthetized in a standard fashion   Anesthetic:  1% lidocaine  w/ epinephrine 1-100,000 buffered  w/ 8.4% NaHCO3 Instrument used: flexible razor blade   Hemostasis achieved with: pressure, aluminum chloride and electrodesiccation   Outcome: patient tolerated procedure well   Post-procedure details: sterile dressing applied and wound care instructions given   Dressing type: bandage and bacitracin    Destruction of lesion Complexity:  extensive   Destruction method: electrodesiccation and curettage   Informed consent: discussed and consent obtained   Timeout:  patient name, date of birth, surgical site, and procedure verified Procedure prep:  Patient was prepped and draped in usual sterile fashion Prep type:  Isopropyl alcohol Anesthesia: the lesion was anesthetized in a standard fashion   Anesthetic:  1% lidocaine  w/ epinephrine 1-100,000 buffered w/ 8.4% NaHCO3 Curettage performed in three different directions: Yes   Electrodesiccation performed over the curetted area: Yes   Lesion length (cm):  2.2 Lesion width (cm):  1.5 Margin per side (cm):  0.2 Final wound size (cm):  2.6 Hemostasis achieved with:  pressure, aluminum chloride and electrodesiccation Outcome: patient tolerated procedure well with no complications   Post-procedure details: sterile dressing applied and wound care instructions given   Dressing type: bandage and bacitracin    Specimen 1 - Surgical pathology Differential Diagnosis: R/O BCc  Check Margins: yes 2.2 x 1.5cm pearly plaque EDC today ACTINIC SKIN DAMAGE   HISTORY OF BASAL CELL CARCINOMA   HISTORY OF SQUAMOUS CELL CARCINOMA   PURPURA (HCC)   CELLULITIS OF TOE OF RIGHT FOOT    Return in about 9 months (around 07/06/2024) for TBSE, Hx of BCC, Hx of SCC, Hx of AKs.  I, Grayce Saunas, RMA, am acting as scribe for Alm Rhyme, MD .   Documentation: I have reviewed the above documentation for accuracy and completeness, and I agree with the above.  Alm Rhyme, MD

## 2023-10-06 NOTE — Patient Instructions (Addendum)

## 2023-10-11 LAB — SURGICAL PATHOLOGY

## 2023-10-12 ENCOUNTER — Encounter: Payer: Self-pay | Admitting: Dermatology

## 2023-10-12 ENCOUNTER — Ambulatory Visit: Payer: Self-pay | Admitting: Dermatology

## 2023-10-12 NOTE — Telephone Encounter (Addendum)
 Tried calling patient regarding results. No answer. Lm for patient to return call.   ----- Message from Alm Rhyme sent at 10/12/2023 12:39 PM EDT ----- FINAL DIAGNOSIS        1. Skin, L mandible :       BASAL CELL CARCINOMA, MICRONODULAR PATTERN   Cancer = BCC Already treated Recheck next visit ----- Message ----- From: Interface, Lab In Three Zero One Sent: 10/12/2023  11:03 AM EDT To: Alm JAYSON Rhyme, MD

## 2023-10-13 ENCOUNTER — Encounter: Payer: Self-pay | Admitting: Dermatology

## 2023-10-13 NOTE — Telephone Encounter (Signed)
 Left message for patient to return our call. Aw

## 2023-10-13 NOTE — Telephone Encounter (Signed)
 Patient advised of BX results. aw

## 2023-10-26 ENCOUNTER — Other Ambulatory Visit: Payer: Self-pay

## 2023-10-26 ENCOUNTER — Emergency Department

## 2023-10-26 ENCOUNTER — Encounter: Payer: Self-pay | Admitting: Emergency Medicine

## 2023-10-26 ENCOUNTER — Emergency Department
Admission: EM | Admit: 2023-10-26 | Discharge: 2023-10-26 | Disposition: A | Discharge status: Home / Self Care | Attending: Emergency Medicine | Admitting: Emergency Medicine

## 2023-10-26 DIAGNOSIS — M79604 Pain in right leg: Secondary | ICD-10-CM

## 2023-10-26 DIAGNOSIS — W01198A Fall on same level from slipping, tripping and stumbling with subsequent striking against other object, initial encounter: Secondary | ICD-10-CM | POA: Diagnosis not present

## 2023-10-26 DIAGNOSIS — M25551 Pain in right hip: Secondary | ICD-10-CM | POA: Insufficient documentation

## 2023-10-26 DIAGNOSIS — E1122 Type 2 diabetes mellitus with diabetic chronic kidney disease: Secondary | ICD-10-CM | POA: Diagnosis not present

## 2023-10-26 DIAGNOSIS — W19XXXA Unspecified fall, initial encounter: Secondary | ICD-10-CM

## 2023-10-26 DIAGNOSIS — I120 Hypertensive chronic kidney disease with stage 5 chronic kidney disease or end stage renal disease: Secondary | ICD-10-CM | POA: Insufficient documentation

## 2023-10-26 DIAGNOSIS — S0003XA Contusion of scalp, initial encounter: Secondary | ICD-10-CM | POA: Diagnosis not present

## 2023-10-26 DIAGNOSIS — S0990XA Unspecified injury of head, initial encounter: Secondary | ICD-10-CM

## 2023-10-26 DIAGNOSIS — S80211A Abrasion, right knee, initial encounter: Secondary | ICD-10-CM | POA: Diagnosis not present

## 2023-10-26 DIAGNOSIS — Z992 Dependence on renal dialysis: Secondary | ICD-10-CM | POA: Insufficient documentation

## 2023-10-26 DIAGNOSIS — N186 End stage renal disease: Secondary | ICD-10-CM | POA: Diagnosis not present

## 2023-10-26 NOTE — ED Provider Triage Note (Signed)
 Emergency Medicine Provider Triage Evaluation Note  Tyler Wiley , a 78 y.o. male  was evaluated in triage.  Pt complains of mechanical fall tripping over a stool sustaining head injury, denies LOC.  Endorses blood thinners.  Also complains of right knee and right hip pain.  Denies chest pain shortness of breath.  Patient is a dialysis patient.   Review of Systems  Positive:  Negative:   Physical Exam  BP (!) 124/103   Pulse 73   Temp 98.1 F (36.7 C) (Oral)   Resp 17   Ht 6' (1.829 m)   Wt 96.6 kg   SpO2 100%   BMI 28.89 kg/m  Gen:   Awake, no distress   Resp:  Normal effort  MSK:   Moves extremities without difficulty  Other:  F ROM of neck without difficulty.  Limited exam of hip as patient is sitting in wheelchair.  Nontender to palpation over right patella.  Medical Decision Making  Medically screening exam initiated at 3:40 PM.  Appropriate orders placed.  DASAN HARDMAN was informed that the remainder of the evaluation will be completed by another provider, this initial triage assessment does not replace that evaluation, and the importance of remaining in the ED until their evaluation is complete.    Margrette, Roxy Mastandrea A, PA-C 10/26/23 1542

## 2023-10-26 NOTE — ED Triage Notes (Signed)
 Patient to ED via POV for a fall. Pt reports he tripped over a step stool. PT reports hitting head on floor- denies LOC, does take blood thinners. Dialysis pt- last treatment last night. C/o right hip and right knee.

## 2023-10-26 NOTE — Discharge Instructions (Signed)

## 2023-10-26 NOTE — ED Provider Notes (Signed)
 [facility] Provider Note    Event Date/Time   First MD Initiated Contact with Patient 10/26/23 1725     (approximate)   History   Fall   HPI  Tyler Wiley is a 78 y.o. male  with a past medical history of TIA, hypertension, type 2 diabetes, paroxysmal atrial fibrillation, ESRD with dialysis presents to the emergency department following a fall that occurred at home today.  Patient states he tripped over a stepstool in his home and fell forward hitting his right knee and right hip, chin and frontal forehead on the floor.  Patient does take Eliquis .  Patient endorses right hip and knee pain.  Denies headache, loss of consciousness, vision changes, any other concerns.     Physical Exam   Triage Vital Signs: ED Triage Vitals  Encounter Vitals Group     BP 10/26/23 1537 (!) 124/103     Girls Systolic BP Percentile --      Girls Diastolic BP Percentile --      Boys Systolic BP Percentile --      Boys Diastolic BP Percentile --      Pulse Rate 10/26/23 1537 73     Resp 10/26/23 1537 17     Temp 10/26/23 1537 98.1 F (36.7 C)     Temp Source 10/26/23 1537 Oral     SpO2 10/26/23 1537 100 %     Weight 10/26/23 1538 213 lb (96.6 kg)     Height 10/26/23 1538 6' (1.829 m)     Head Circumference --      Peak Flow --      Pain Score 10/26/23 1538 6     Pain Loc --      Pain Education --      Exclude from Growth Chart --     Most recent vital signs: Vitals:   10/26/23 1537  BP: (!) 124/103  Pulse: 73  Resp: 17  Temp: 98.1 F (36.7 C)  SpO2: 100%    General: Awake, in no acute distress. Appears stated age. Head: Normocephalic.  Small contusion noted to the right parietal region. Eyes: PERRLA. EOMs intact.  Ears/Nose/Throat: TMs intact b/l. Nares patent, no nasal discharge. Oropharynx moist, no erythema or exudate. Dentition intact. Neck: Supple, no nuchal rigidity. CV: Regular rate, 73 bpm. Peripheral pulses 2+ and symmetric. No  edema. Respiratory: No respiratory distress. Normal respiratory effort. GI: Soft, non-distended, non-tender. No rebound or guarding.  MSK:  Reduced range of motion due to pain along the IT band on the right leg, the patient is able to perform 5 out of 5 strength of bilateral legs.  Passive range of motion possible with all right hip motions. Skin:Warm, dry, intact.  Small abrasion noted to the right knee, covered with Band-Aid. Neurological: A&Ox4 to person, place, time, and situation. Cranial nerves II-XII grossly intact. Sensation intact. Strength symmetric. No focal deficits.   ED Results / Procedures / Treatments   Labs (all labs ordered are listed, but only abnormal results are displayed) Labs Reviewed - No data to display   EKG     RADIOLOGY X-rays of right knee and hip ordered, CT of cervical head and spine ordered.  I independently viewed the x-rays of the right hip and knee and radiologist's report.  I agree with the radiologist's report that there is no acute fracture or dislocation of the right hip or knee.   PROCEDURES:  Critical Care performed: No   Procedures   MEDICATIONS  ORDERED IN ED: Medications - No data to display   IMPRESSION / MDM / ASSESSMENT AND PLAN / ED COURSE  I reviewed the triage vital signs and the nursing notes.                              Differential diagnosis includes, but is not limited to, mechanical fall, subdural or epidural hematoma, patellar fracture, knee abrasion, hip fracture, pelvic fracture  Patient's presentation is most consistent with acute presentation with potential threat to life or bodily function.  Patient is a 78 year old male who presented today for mechanical fall.  Patient reported right knee and hip pain as well as hitting his head.  CT of the head and cervical spine without any acute findings.  X-rays of the right knee with minor joint effusion, but no acute fracture or dislocation.  X-ray of right hip shows  osteopenia/osteoporosis but no acute fracture.  Physical exam is reassuring.  Patient told to use Tylenol  at home for pain.  He should follow-up with his primary care provider regarding today's ER visit.  He can return to the emergency department or follow-up with his PCP for any new, worsening or concerning symptoms.  Patient was given the opportunity to ask questions; all questions were answered. Emergency department return precautions were discussed with the patient.  Patient is in agreement to the treatment plan.  Patient is stable for discharge.    FINAL CLINICAL IMPRESSION(S) / ED DIAGNOSES   Final diagnoses:  Fall, initial encounter  Abrasion of right knee, initial encounter  Right leg pain  Minor head injury, initial encounter     Rx / DC Orders   ED Discharge Orders     None        Note:  This document was prepared using Dragon voice recognition software and may include unintentional dictation errors.     Sheron Salm, PA-C 10/26/23 1906    Jacolyn Pae, MD 10/26/23 2210

## 2023-11-17 ENCOUNTER — Telehealth: Payer: Self-pay

## 2023-11-17 ENCOUNTER — Ambulatory Visit: Admitting: Physician Assistant

## 2023-11-17 VITALS — BP 150/62 | HR 40 | Ht 72.0 in | Wt 211.0 lb

## 2023-11-17 DIAGNOSIS — R3912 Poor urinary stream: Secondary | ICD-10-CM | POA: Diagnosis not present

## 2023-11-17 DIAGNOSIS — R339 Retention of urine, unspecified: Secondary | ICD-10-CM

## 2023-11-17 LAB — URINALYSIS, COMPLETE
Bilirubin, UA: NEGATIVE
Glucose, UA: NEGATIVE
Ketones, UA: NEGATIVE
Leukocytes,UA: NEGATIVE
Nitrite, UA: NEGATIVE
Specific Gravity, UA: 1.015 (ref 1.005–1.030)
Urobilinogen, Ur: 0.2 mg/dL (ref 0.2–1.0)
pH, UA: 6 (ref 5.0–7.5)

## 2023-11-17 LAB — MICROSCOPIC EXAMINATION: Bacteria, UA: NONE SEEN

## 2023-11-17 LAB — BLADDER SCAN AMB NON-IMAGING: Scan Result: 99

## 2023-11-17 NOTE — Telephone Encounter (Signed)
 Returned patients call. Patient states he can not urinate any time he tries to go since last night. He is a dialysis patient that still makes urine. He has had urolift in the past. He is coming in now for a 9am we scheduled to see sam to see if he is in retention.

## 2023-11-17 NOTE — Progress Notes (Signed)
 11/17/2023 9:19 AM   Tyler Wiley [birth year] [id number]  CC: Chief Complaint  Patient presents with   Urinary Retention   HPI: Tyler Wiley is a 78 y.o. male with PMH ESRD on PD, BPH s/p UroLift in 2020, and recurrent UTI/bacteriuria who presents today for evaluation of possible urinary retention.  He is accompanied today by his wife, who contributes to HPI.  Today he reports he has been unable to void for the past 5 to 6 hours.  He feels the urge to go, but admits that some of that may be because he feels that he is due to void.  He typically voids every 2-3 hours overnight.  He still makes urine, with typical output 600 to 700 mL overnight.  He admits to 6+ months of weak stream.  He will occasionally feel like he cannot void, possibly associated with constipation, and ultimately starts urinating within a couple of hours.  Bladder scan on arrival 99mL.  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  Tolerated Ancef .   Shrimp Extract    Sometimes breaks out in hives        Medication List        Accurate as of  November 17, 2023  9:19 AM. If you have any questions, ask your nurse or doctor.          apixaban  5 MG Tabs tablet Commonly known as: ELIQUIS  Take 1 tablet (5 mg total) by mouth 2 (two) times daily.   atorvastatin  40 MG tablet Commonly known as: LIPITOR Take 1 tablet (40 mg total) by mouth daily at 6 PM.   carvedilol  6.25 MG tablet Commonly known as: COREG  Take 6.25 mg by mouth.   clotrimazole-betamethasone cream Commonly known as: LOTRISONE APPLY TO AFFECTED AREA TWICE A DAY   doxazosin 2 MG tablet Commonly known as: CARDURA Take 2 mg by mouth daily.   fluticasone  50 MCG/ACT nasal spray Commonly known as: Flonase  Place 2 sprays into both nostrils daily as needed for allergies. Home med.   glimepiride  2 MG tablet Commonly known as: AMARYL  Take 2 mg by mouth daily with breakfast.   mupirocin  ointment 2 % Commonly known as: BACTROBAN  Apply 1 Application topically daily. qd to open sores on right hand, right foot and right great toe   ondansetron  8 MG disintegrating tablet Commonly known as: ZOFRAN -ODT Take 1-2 tablets (8-16 mg total) by mouth every 8 (eight) hours as needed for Nausea for up to 7  days   pantoprazole  40 MG tablet Commonly known as: PROTONIX  Take 40 mg by mouth daily.   SULFAMETHOXAZOLE -TMP DS PO   torsemide  10 MG tablet Commonly known as: DEMADEX  Take 10 mg by mouth 2 (two) times daily.        Allergies:  Allergies  Allergen Reactions   Amlodipine  Swelling   Cefuroxime     Other: Mouth ulcers, tongue swollen.  Tolerated Ancef .   Shrimp Extract     Sometimes breaks out in hives    Family History: Family History  Problem Relation Age of Onset   CAD Father     Social History:   reports that he has quit smoking. His smoking use included cigarettes. He has never used smokeless tobacco. He reports current alcohol use. He reports that he does not use drugs.  Physical Exam: BP (!) 150/62   Pulse (!) 40   Ht 6' (1.829 m)   Wt 211 lb  (95.7 kg)   BMI 28.62 kg/m   Constitutional:  Alert and oriented, no acute distress, nontoxic appearing HEENT: [place], AT Cardiovascular: No clubbing, cyanosis, or edema Respiratory: Normal respiratory effort, no increased work of breathing Skin: No rashes, bruises or suspicious lesions Neurologic: Grossly intact, no focal deficits, moving all 4 extremities Psychiatric: Normal mood and affect  Laboratory Data: Results for orders placed or performed in visit on 11/17/23  Bladder Scan (Post Void Residual) in office   Collection Time: 11/17/23  9:11 AM  Result Value Ref Range   Scan Result 99    In and Out Catheterization  Patient is present today for a I & O catheterization due to possible urinary retention. Patient was cleaned and prepped in a sterile fashion with betadine . A 14FR cath was inserted no complications were noted , of urine return was noted, urine was yellow in color. A clean urine sample was collected for UA. Bladder was drained and catheter was removed without difficulty.    Performed by: Elzada Pytel, PA-C   Assessment & Plan:   1. Weak urinary stream (Primary) PVR on arrival WNL, however I suspected this might be inaccurate due to his PD.  I offered him I&O catheterization and he agreed, measured residual 200 mL.  Will send urine specimen for UA and contact with results.  Differential includes cystitis versus decreased UOP due to ESRD.  I did not recommend indwelling Foley based on his residual today and he agreed.  Will plan for cystoscopy for further evaluation of his obstructive symptoms. - Bladder Scan (Post Void Residual) in office - Urinalysis, Complete   Return in about 4 weeks (around 12/15/2023) for Cysto with Dr. Twylla.  Lucie Hones, PA-C  [facility] [address] [place], [place] [address] [phone]

## 2023-11-18 ENCOUNTER — Other Ambulatory Visit (INDEPENDENT_AMBULATORY_CARE_PROVIDER_SITE_OTHER): Payer: Self-pay | Admitting: Nurse Practitioner

## 2023-11-18 ENCOUNTER — Ambulatory Visit: Payer: Self-pay | Admitting: Physician Assistant

## 2023-11-18 DIAGNOSIS — S61401A Unspecified open wound of right hand, initial encounter: Secondary | ICD-10-CM

## 2023-11-18 DIAGNOSIS — I96 Gangrene, not elsewhere classified: Secondary | ICD-10-CM

## 2023-11-19 ENCOUNTER — Ambulatory Visit (INDEPENDENT_AMBULATORY_CARE_PROVIDER_SITE_OTHER): Payer: Self-pay

## 2023-11-19 ENCOUNTER — Telehealth (INDEPENDENT_AMBULATORY_CARE_PROVIDER_SITE_OTHER): Payer: Self-pay

## 2023-11-19 ENCOUNTER — Encounter (INDEPENDENT_AMBULATORY_CARE_PROVIDER_SITE_OTHER): Payer: Self-pay | Admitting: Vascular Surgery

## 2023-11-19 ENCOUNTER — Ambulatory Visit (INDEPENDENT_AMBULATORY_CARE_PROVIDER_SITE_OTHER): Payer: Self-pay | Admitting: Vascular Surgery

## 2023-11-19 VITALS — BP 152/83 | HR 64 | Ht 72.0 in | Wt 211.0 lb

## 2023-11-19 DIAGNOSIS — I480 Paroxysmal atrial fibrillation: Secondary | ICD-10-CM | POA: Diagnosis not present

## 2023-11-19 DIAGNOSIS — S61401A Unspecified open wound of right hand, initial encounter: Secondary | ICD-10-CM

## 2023-11-19 DIAGNOSIS — I10 Essential (primary) hypertension: Secondary | ICD-10-CM | POA: Diagnosis not present

## 2023-11-19 DIAGNOSIS — E1122 Type 2 diabetes mellitus with diabetic chronic kidney disease: Secondary | ICD-10-CM

## 2023-11-19 DIAGNOSIS — N186 End stage renal disease: Secondary | ICD-10-CM

## 2023-11-19 DIAGNOSIS — I96 Gangrene, not elsewhere classified: Secondary | ICD-10-CM | POA: Diagnosis not present

## 2023-11-19 DIAGNOSIS — I4891 Unspecified atrial fibrillation: Secondary | ICD-10-CM

## 2023-11-19 DIAGNOSIS — I7025 Atherosclerosis of native arteries of other extremities with ulceration: Secondary | ICD-10-CM | POA: Diagnosis not present

## 2023-11-19 DIAGNOSIS — Z992 Dependence on renal dialysis: Secondary | ICD-10-CM

## 2023-11-19 DIAGNOSIS — I739 Peripheral vascular disease, unspecified: Secondary | ICD-10-CM | POA: Insufficient documentation

## 2023-11-19 DIAGNOSIS — L98499 Non-pressure chronic ulcer of skin of other sites with unspecified severity: Secondary | ICD-10-CM | POA: Insufficient documentation

## 2023-11-19 NOTE — H&P (View-Only) (Signed)
 Patient ID: Tyler Wiley, male   DOB: 07/13/1945, 78 y.o.   MRN: [id number]  Chief Complaint  Patient presents with   New Patient (Initial Visit)     np. UE arterial + ABI + consult. Unspecified open wound     HPI Tyler Wiley is a 78 y.o. male.  I am asked to see the patient by Dr. Lenon for evaluation of ulceration on the ring finger of the right hand and right great toe.  These are both very painful although the hand is much worse than the foot.  They have been healing extremely slowly.  The patient is a longstanding dialysis patient doing peritoneal dialysis for the past 5 years.  He has not had an access in either upper extremity.  He denies any fevers or chills.  No signs of systemic infection.  To evaluate his lower extremity perfusion noninvasive studies were performed in the office.  His ABIs were falsely elevated due to medial calcification but his waveforms were strong and his digit pressures were normal bilaterally in the lower extremities consistent with no arterial insufficiency. Upper extremity arterial studies were also performed.  In the right arm, there was markedly diminished perfusion in the digital vessels.  The flow was triphasic down through the radial and ulnar arteries and this was more consistent with diminished flow in the palmar arch or digital vessels.  On the left, there was somewhat diminished perfusion in the digital vessels but much better than on the right side.  Again, the flow is triphasic down to the radial and ulnar arteries.   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    Social History   Tobacco Use   Smoking status: Former    Types: Cigarettes   Smokeless tobacco: Never  Substance Use Topics   Alcohol use: Yes    Comment: rare   Drug use: Never     Allergies  Allergen Reactions   Amlodipine  Swelling   Cefuroxime     Other: Mouth ulcers, tongue swollen.  Tolerated Ancef .   Shrimp Extract     Sometimes breaks out in hives    Current Outpatient Medications  Medication Sig Dispense Refill   apixaban  (ELIQUIS ) 5 MG TABS tablet Take 1 tablet (5 mg total) by mouth 2 (two) times daily. 60 tablet 2   atorvastatin  (LIPITOR) 40 MG tablet Take 1 tablet (40 mg total) by mouth daily at 6 PM. 30 tablet 0   carvedilol  (COREG ) 6.25 MG tablet Take 6.25 mg by mouth.  clotrimazole-betamethasone (LOTRISONE) cream APPLY TO AFFECTED AREA TWICE A DAY 45 g 0   fluticasone  (FLONASE ) 50 MCG/ACT nasal spray Place 2 sprays into both nostrils daily as needed for allergies. Home med.     glimepiride  (AMARYL ) 2 MG tablet Take 2 mg by mouth daily with breakfast.     mupirocin  ointment (BACTROBAN ) 2 % Apply 1 Application topically daily. qd to open sores on right hand, right foot and right great toe 22 g 1   ondansetron  (ZOFRAN -ODT) 8 MG disintegrating tablet Take 1-2 tablets (8-16 mg total) by mouth every 8 (eight) hours as needed for Nausea for up to 7 days     pantoprazole  (PROTONIX ) 40 MG tablet Take 40 mg by mouth daily.     Sulfamethoxazole -Trimethoprim  (SULFAMETHOXAZOLE -TMP DS PO)      torsemide  (DEMADEX ) 10 MG tablet Take 10 mg  by mouth 2 (two) times daily.     doxazosin (CARDURA) 2 MG tablet Take 2 mg by mouth daily.     No current facility-administered medications for this visit.      REVIEW OF SYSTEMS (Negative unless checked)  Constitutional: [] Weight loss  [] Fever  [] Chills Cardiac: [] Chest pain   [] Chest pressure   [] Palpitations   [] Shortness of breath when laying flat   [] Shortness of breath at rest   [x] Shortness of breath with exertion. Vascular:  [] Pain in legs with walking   [] Pain in legs at rest   [] Pain in legs when laying flat   [] Claudication   [] Pain in feet when walking  [] Pain in feet at rest  [] Pain in feet when laying flat   [] History of DVT   [] Phlebitis   [] Swelling in legs   [] Varicose veins   [] Non-healing ulcers Pulmonary:   [] Uses home oxygen   [] Productive cough   [] Hemoptysis   [] Wheeze  [] COPD   [] Asthma Neurologic:  [] Dizziness  [] Blackouts   [] Seizures   [] History of stroke   [] History of TIA  [] Aphasia   [] Temporary blindness   [] Dysphagia   [x] Weakness or numbness in arms   [] Weakness or numbness in legs Musculoskeletal:  [x] Arthritis   [] Joint swelling   [x] Joint pain   [] Low back pain Hematologic:  [] Easy bruising  [] Easy bleeding   [] Hypercoagulable state   [x] Anemic  [] Hepatitis Gastrointestinal:  [] Blood in stool   [] Vomiting blood  [] Gastroesophageal reflux/heartburn   [] Abdominal pain Genitourinary:  [x] Chronic kidney disease   [] Difficult urination  [] Frequent urination  [] Burning with urination   [] Hematuria Skin:  [] Rashes   [x] Ulcers   [x] Wounds Psychological:  [] History of anxiety   []  History of major depression.    Physical Exam BP (!) 152/83   Pulse 64   Ht 6' (1.829 m)   Wt 211 lb (95.7 kg)   BMI 28.62 kg/m  Gen:  WD/WN, NAD Head: [place]/AT, No temporalis wasting. Ear/Nose/Throat: Hearing grossly intact, nares w/o erythema or drainage, oropharynx w/o Erythema/Exudate Eyes: Conjunctiva clear, sclera non-icteric  Neck: trachea midline.  No JVD.  Pulmonary:   Good air movement, respirations not labored, no use of accessory muscles  Cardiac: RRR, no JVD Vascular:  Vessel Right Left  Radial Palpable Palpable                          PT Palpable  Palpable   DP Palpable  Palpable    Gastrointestinal:. No masses, surgical incisions, or scars. Musculoskeletal: M/S 5/5 throughout.  Right great toe has a black eschar on the distal aspect  of the toe.  There is good capillary refill in the foot.  The right fourth finger has both a black eschar at the tip as well as about a 1 cm circular ulceration in the midportion of the finger between the interphalangeal joints.  This is on the top of the finger and is pale with atrophy in the region.  No clear exposed bone with a black eschar present. Neurologic: Sensation grossly intact in extremities.  Symmetrical.  Speech is fluent. Motor exam as listed above. Psychiatric: Judgment intact, Mood & affect appropriate for pt's clinical situation. Dermatologic: Right hand and foot wounds as above.    Radiology VAS US  UPPER EXTREMITY ARTERIAL DUPLEX Result Date: 11/22/2023  UPPER EXTREMITY DUPLEX STUDY Patient Name:  NUEL DEJAYNES  Date of Exam:   11/19/2023 Medical Rec #: [id number]      Accession #:    [id number] Date of Birth: July 06, 1945       Patient Gender: M Patient Age:   82 years Exam Location:  [facility] Procedure:      VAS US  UPPER EXTREMITY ARTERIAL DUPLEX Referring Phys: ORVIN DARING --------------------------------------------------------------------------------  Indications: Right hand wound.  Performing Technologist: Leafy Gibes RVS  Examination Guidelines: A complete evaluation includes B-mode imaging, spectral Doppler, color Doppler, and power Doppler as needed of all accessible portions of each vessel. Bilateral testing is considered an integral part of a complete examination. Limited examinations for reoccurring indications may be performed as noted.  Right Doppler Findings:  +--------------+----------+---------+--------+--------+ Site          PSV (cm/s)Waveform StenosisComments +--------------+----------+---------+--------+--------+ Subclavian Mid71        triphasic                 +--------------+----------+---------+--------+--------+ Axillary      46        triphasic                 +--------------+----------+---------+--------+--------+ Brachial Prox 83        triphasic                 +--------------+----------+---------+--------+--------+ Brachial Dist 78        triphasic                 +--------------+----------+---------+--------+--------+ Radial Dist   44        triphasic                 +--------------+----------+---------+--------+--------+ Ulnar Dist    28        triphasic                 +--------------+----------+---------+--------+--------+ Palmar Arch   14        Dampened                  +--------------+----------+---------+--------+--------+ Abnormal PPG waveforms noted in the bilateral upper digits (right worse than left).  Summary:  Right: Normal arterial flow noted throughout the right upper        extremity with dampened flow noted in the palmar artery. *See table(s) above for measurements and observations.  Electronically signed by Selinda Gu MD on 11/22/2023 at 7:43:51 AM.    Final    VAS US  ABI WITH/WO TBI Result Date: 11/22/2023  LOWER EXTREMITY DOPPLER STUDY Patient Name:  GABINO HAGIN  Date of Exam:   11/19/2023 Medical Rec #: [id number]      Accession #:    [id number] Date of Birth: [birth year]       Patient Gender:  M Patient Age:   89 years Exam Location:  [facility] Procedure:      VAS US  ABI WITH/WO TBI Referring Phys: --------------------------------------------------------------------------------  Indications: Peripheral artery disease.  Performing Technologist: Leafy Gibes RVS  Examination Guidelines: A complete evaluation includes at minimum, Doppler waveform signals and systolic blood  pressure reading at the level of bilateral brachial, anterior tibial, and posterior tibial arteries, when vessel segments are accessible. Bilateral testing is considered an integral part of a complete examination. Photoelectric Plethysmograph (PPG) waveforms and toe systolic pressure readings are included as required and additional duplex testing as needed. Limited examinations for reoccurring indications may be performed as noted.  ABI Findings: +---------+------------------+-----+---------+-------+ Right    Rt Pressure (mmHg)IndexWaveform Comment +---------+------------------+-----+---------+-------+ Brachial 148                                     +---------+------------------+-----+---------+-------+ PTA      249               1.60 triphasic        +---------+------------------+-----+---------+-------+ DP       241               1.54 triphasic        +---------+------------------+-----+---------+-------+ Great Toe143               0.92 Abnormal         +---------+------------------+-----+---------+-------+ +---------+------------------+-----+--------+-------+ Left     Lt Pressure (mmHg)IndexWaveformComment +---------+------------------+-----+--------+-------+ Brachial 156                                    +---------+------------------+-----+--------+-------+ PTA      231               1.48 biphasic        +---------+------------------+-----+--------+-------+ DP       224               1.44 biphasic        +---------+------------------+-----+--------+-------+ Great Toe156               1.00 Abnormal        +---------+------------------+-----+--------+-------+  Summary: Bilateral: Bilateral Doppler waveforms and TBIs suggest normal perfusion to the bilateral lower extremities. Bilateral ankle pressures demonstrated medial calcification.  *See table(s) above for measurements and observations.  Electronically signed by Selinda Gu MD on 11/22/2023 at 7:43:27  AM.    Final    CT Cervical Spine Wo Contrast Result Date: 10/26/2023 CLINICAL DATA:  fall EXAM: CT CERVICAL SPINE WITHOUT CONTRAST TECHNIQUE: Multidetector CT imaging of the cervical spine was performed without intravenous contrast. Multiplanar CT image reconstructions were also generated. RADIATION DOSE REDUCTION: This exam was performed according to the departmental dose-optimization program which includes automated exposure control, adjustment of the mA and/or kV according to patient size and/or use of iterative reconstruction technique. COMPARISON:  None Available. FINDINGS: Alignment: Straightening of the normal cervical lordosis. Skull base and vertebrae: No acute fracture. No primary bone lesion or focal pathologic process. Soft tissues and spinal canal: There is no paraspinous hematoma or soft tissue swelling present. There is moderate calcific atheromatous disease within the carotid bulbs bilaterally. Disc levels: There is mild chronic degenerative disc disease at C5-6 without significant spinal canal or neural foraminal stenosis. The left facets at C2-3 are fused. Upper chest: The lung apices are  clear. Other: None. IMPRESSION: 1. Mild multilevel degenerative disc disease and facet arthrosis, without evidence of acute traumatic injury. Electronically Signed   By: Evalene Coho M.D.   On: 10/26/2023 17:17   CT Head Wo Contrast Result Date: 10/26/2023 CLINICAL DATA:  Head trauma, minor (Age >= 65y) EXAM: CT HEAD WITHOUT CONTRAST TECHNIQUE: Contiguous axial images were obtained from the base of the skull through the vertex without intravenous contrast. RADIATION DOSE REDUCTION: This exam was performed according to the departmental dose-optimization program which includes automated exposure control, adjustment of the mA and/or kV according to patient size and/or use of iterative reconstruction technique. COMPARISON:  CT of the head dated May 09, 2018. FINDINGS: Brain: Chronic encephalomalacia  changes are again demonstrated the right parietal lobe. There is no evidence of acute intracranial injury. The brain is otherwise unremarkable. No evidence of hemorrhage, mass or hydrocephalus. Vascular: Mild to moderate calcific atheromatous disease within the carotid siphons. Skull: Intact and unremarkable. Sinuses/Orbits: Mild circumferential mucosal disease within the left maxillary sinus. Mild ethmoid air cell disease. Other: None. IMPRESSION: 1. Chronic encephalomalacia changes within the right parietal lobe. No evidence of acute intracranial injury. Electronically Signed   By: Evalene Coho M.D.   On: 10/26/2023 17:10   DG Knee Complete 4 Views Right Result Date: 10/26/2023 CLINICAL DATA:  Mechanical fall tripping over a stool. Right hip and knee pain. EXAM: RIGHT KNEE - COMPLETE 4+ VIEW COMPARISON:  None Available. FINDINGS: The bones are subjectively under mineralized. No evidence of acute fracture. No dislocation. Joint spaces are preserved. Trace joint effusion. Extensive peripheral vascular calcifications. IMPRESSION: 1. No acute fracture or dislocation of the right knee. Trace joint effusion. 2. Extensive peripheral vascular calcifications. Electronically Signed   By: Andrea Gasman M.D.   On: 10/26/2023 16:22   DG Hip Unilat W or Wo Pelvis 2-3 Views Right Result Date: 10/26/2023 CLINICAL DATA:  Mechanical fall tripping over a stool. Right hip and knee pain. EXAM: DG HIP (WITH OR WITHOUT PELVIS) 2-3V RIGHT COMPARISON:  None Available. FINDINGS: The bones are subjectively under mineralized. No acute fracture of the pelvis or right hip. No hip dislocation. Pubic rami are intact. Pubic symphysis and sacroiliac joints are congruent. Extensive vascular calcifications. IMPRESSION: 1. No acute fracture of the pelvis or right hip. 2. Osteopenia/osteoporosis. Electronically Signed   By: Andrea Gasman M.D.   On: 10/26/2023 16:19    Labs Recent Results (from the past 2160 hours)  Comprehensive  metabolic panel     Status: Abnormal   Collection Time: 09/09/23 11:21 AM  Result Value Ref Range   Sodium 136 135 - 145 mmol/L   Potassium 3.7 3.5 - 5.1 mmol/L   Chloride 100 98 - 111 mmol/L   CO2 22 22 - 32 mmol/L   Glucose, Bld 252 (H) 70 - 99 mg/dL    Comment: Glucose reference range applies only to samples taken after fasting for at least 8 hours.   BUN 56 (H) 8 - 23 mg/dL   Creatinine, Ser 89.55 (H) 0.61 - 1.24 mg/dL   Calcium  9.6 8.9 - 10.3 mg/dL   Total Protein 6.5 6.5 - 8.1 g/dL   Albumin 3.3 (L) 3.5 - 5.0 g/dL   AST 14 (L) 15 - 41 U/L   ALT 15 0 - 44 U/L   Alkaline Phosphatase 89 38 - 126 U/L   Total Bilirubin 0.6 0.0 - 1.2 mg/dL   GFR, Estimated 5 (L) >60 mL/min    Comment: (NOTE) Calculated using the  CKD-EPI Creatinine Equation (2021)    Anion gap 14 5 - 15    Comment: Performed at [facility], [address] [place], [place] [address]  CBC     Status: Abnormal   Collection Time: 09/09/23 11:21 AM  Result Value Ref Range   WBC 12.9 (H) 4.0 - 10.5 K/uL   RBC 3.25 (L) 4.22 - 5.81 MIL/uL   Hemoglobin 10.2 (L) 13.0 - 17.0 g/dL   HCT 69.7 (L) 60.9 - 47.9 %   MCV 92.9 80.0 - 100.0 fL   MCH 31.4 26.0 - 34.0 pg   MCHC 33.8 30.0 - 36.0 g/dL   RDW 85.9 88.4 - 84.4 %   Platelets 152 150 - 400 K/uL   nRBC 0.0 0.0 - 0.2 %    Comment: Performed at [facility], [address], [place], [place] [address]  Troponin I (High Sensitivity)     Status: Abnormal   Collection Time: 09/09/23 11:21 AM  Result Value Ref Range   Troponin I (High Sensitivity) 34 (H) <18 ng/L    Comment: (NOTE) Elevated high sensitivity troponin I (hsTnI) values and significant  changes across serial measurements may suggest ACS but many other  chronic and acute conditions are known to elevate hsTnI results.  Refer to the Links section for chest pain algorithms and additional  guidance. Performed at [facility], [address] [place], [place] [address]    Urinalysis, Routine w reflex microscopic -Urine, Clean Catch     Status: Abnormal   Collection Time: 09/09/23  1:48 PM  Result Value Ref Range   Color, Urine YELLOW (A) YELLOW   APPearance HAZY (A) CLEAR   Specific Gravity, Urine 1.015 1.005 - 1.030   pH 5.0 5.0 - 8.0   Glucose, UA 50 (A) NEGATIVE mg/dL   Hgb urine dipstick SMALL (A) NEGATIVE   Bilirubin Urine NEGATIVE NEGATIVE   Ketones, ur NEGATIVE NEGATIVE mg/dL   Protein, ur >=699 (A) NEGATIVE mg/dL   Nitrite NEGATIVE NEGATIVE   Leukocytes,Ua TRACE (A) NEGATIVE   RBC / HPF 0-5 0 - 5 RBC/hpf   WBC, UA 6-10 0 - 5 WBC/hpf   Bacteria, UA RARE (A) NONE SEEN   Squamous Epithelial / HPF 0-5 0 - 5 /HPF   Mucus PRESENT     Comment: Performed at [facility], [address], [place], [place] [address]  Troponin I (High Sensitivity)     Status: Abnormal   Collection Time: 09/09/23  2:31 PM  Result Value Ref Range   Troponin I (High Sensitivity) 31 (H) <18 ng/L    Comment: (NOTE) Elevated high sensitivity troponin I (hsTnI) values and significant  changes across serial measurements may suggest ACS but many other  chronic and acute conditions are known to elevate hsTnI results.  Refer to the Links section for chest pain algorithms and additional  guidance. Performed at [facility], [address] [place] [place], [place] [address]   Surgical pathology     Status: None   Collection Time: 10/06/23 12:00 AM  Result Value Ref Range   SURGICAL PATHOLOGY      SURGICAL PATHOLOGY [facility] [address] [place], [place] [address] Telephone [phone] or [phone] Fax [phone]  REPORT OF DERMATOPATHOLOGY   Accession #: [id number] Patient Name: KENNIEL, BERGSMA Visit # : [id number]  MRN: [id number] Physician: Hester Lenis DOB/Age [birth year] (Age: 60) Gender: M Collected Date: 10/06/2023 Received Date: 10/07/2023  FINAL DIAGNOSIS  1. Skin, L mandible :       BASAL  CELL CARCINOMA, MICRONODULAR PATTERN       DATE SIGNED OUT: 10/11/2023 ELECTRONIC SIGNATURE : MATTOCH M.D., OCTAVIA , Pathologist  MICROSCOPIC DESCRIPTION 1. There is a proliferation of atypical basaloid cells arranged predominantly in a micronodular pattern in the dermis.  CASE COMMENTS STAINS USED IN DIAGNOSIS: H&E H&E H&E H&E    CLINICAL HISTORY  SPECIMEN(S) OBTAINED 1. Skin, L Mandible  SPECIMEN COMMENTS: 1. 2.2 x 1.5cm pearly plaque, check margins, EDC today SPECIMEN CLINICAL INFORMATION : 1. Neoplasm of skin, R/O BCC    Gross Description 1. Formalin fixed specimen received:  18 X 13 X 1 MM, (7 P) (2 B) ( nn ) margins inked.=a,b All identifiable tissue submitted; apparent keratin      debris/crust fragments left in container.      a=3      b=4      Additional formalin fixed specimen received in bottle:  15 X 12 X 1 MM, (6 P) (2      B) margins inked. = c,d    ( nn )      c-d=3        Report signed out from the following location(s) [facility] [address] [place], [place] [address] CLIA #: [id number]  [facility] [address] [place], [place] [address] CLIA #: [id number]   Bladder Scan (Post Void Residual) in office     Status: None   Collection Time: 11/17/23  9:11 AM  Result Value Ref Range   Scan Result 99   Urinalysis, Complete     Status: Abnormal   Collection Time: 11/17/23  9:47 AM  Result Value Ref Range   Specific Gravity, UA 1.015 1.005 - 1.030   pH, UA 6.0 5.0 - 7.5   Color, UA Yellow Yellow   Appearance Ur Clear Clear   Leukocytes,UA Negative Negative   Protein,UA 3+ (A) Negative/Trace   Glucose, UA Negative Negative   Ketones, UA Negative Negative   RBC, UA 1+ (A) Negative   Bilirubin, UA Negative Negative   Urobilinogen, Ur 0.2 0.2 - 1.0 mg/dL   Nitrite, UA Negative Negative   Microscopic Examination See below:     Comment: Microscopic was indicated and was performed.  Microscopic  Examination     Status: Abnormal   Collection Time: 11/17/23  9:47 AM   Urine  Result Value Ref Range   WBC, UA 0-5 0 - 5 /hpf   RBC, Urine 3-10 (A) 0 - 2 /hpf   Epithelial Cells (non renal) 0-10 0 - 10 /hpf   Bacteria, UA None seen None seen/Few    Assessment/Plan:  HTN (hypertension) blood pressure control important in reducing the progression of atherosclerotic disease. On appropriate oral medications.   Paroxysmal atrial fibrillation (HCC) On Eliquis .  Type 2 diabetes mellitus with chronic kidney disease on chronic dialysis, without long-term current use of insulin  (HCC) blood glucose control important in reducing the progression of atherosclerotic disease. Also, involved in wound healing. On appropriate medications.   ESRD needing dialysis (HCC) Increase his atherosclerotic risk and also decrease his wound healing potential.  Skin ulcer of hand (HCC) The patient has markedly diminished perfusion to his right hand.  With ulceration, this is a critical and limb threatening situation.  This appears to be largely small vessel disease, and I am not sure there will be much we can do to help, but an angiogram still  appropriate for further evaluation and potential revascularization.  This is a very serious and limb threatening situation and he understands this.  Angiogram will be scheduled in the near future at his convenience.  PAD (peripheral artery disease) (HCC) ABIs showed triphasic waveforms and normal ABIs bilaterally.  His lower extremity perfusion appears to be adequate at this point.  Atherosclerosis of native arteries of the extremities with ulceration (HCC) His ABIs were falsely elevated due to medial calcification but his waveforms were strong and his digit pressures were normal bilaterally in the lower extremities consistent with no arterial insufficiency.  Continue local wound care.  No role for lower extremity intervention at this time.      Selinda Gu 11/22/2023,  9:51 AM   This note was created with Dragon medical transcription system.  Any errors from dictation are unintentional.

## 2023-11-19 NOTE — Assessment & Plan Note (Signed)
 Increase his atherosclerotic risk and also decrease his wound healing potential.

## 2023-11-19 NOTE — Assessment & Plan Note (Signed)
 blood pressure control important in reducing the progression of atherosclerotic disease. On appropriate oral medications.

## 2023-11-19 NOTE — Telephone Encounter (Signed)
 Spoke with the patient and he is scheduled with Dr. Marea on 11/25/23 with a 2:15 pm arrival time to the [facility] for a right arm angiogram. Pre-procedure instructions were discussed and will be sent to Mychart and mailed.

## 2023-11-19 NOTE — H&P (View-Only) (Signed)
 Patient ID: Tyler Wiley, male   DOB: [birth year], 78 y.o.   MRN: [id number]  Chief Complaint  Patient presents with   New Patient (Initial Visit)     np. UE arterial + ABI + consult. Unspecified open wound     HPI Tyler Wiley is a 78 y.o. male.  I am asked to see the patient by Dr. Lenon for evaluation of ulceration on the ring finger of the right hand and right great toe.  These are both very painful although the hand is much worse than the foot.  They have been healing extremely slowly.  The patient is a longstanding dialysis patient doing peritoneal dialysis for the past 5 years.  He has not had an access in either upper extremity.  He denies any fevers or chills.  No signs of systemic infection.  To evaluate his lower extremity perfusion noninvasive studies were performed in the office.  His ABIs were falsely elevated due to medial calcification but his waveforms were strong and his digit pressures were normal bilaterally in the lower extremities consistent with no arterial insufficiency. Upper extremity arterial studies were also performed.  In the right arm, there was markedly diminished perfusion in the digital vessels.  The flow was triphasic down through the radial and ulnar arteries and this was more consistent with diminished flow in the palmar arch or digital vessels.  On the left, there was somewhat diminished perfusion in the digital vessels but much better than on the right side.  Again, the flow is triphasic down to the radial and ulnar arteries.   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    Social History   Tobacco Use   Smoking status: Former    Types: Cigarettes   Smokeless tobacco: Never  Substance Use Topics   Alcohol use: Yes    Comment: rare   Drug use: Never     Allergies  Allergen Reactions   Amlodipine  Swelling   Cefuroxime     Other: Mouth ulcers, tongue swollen.  Tolerated Ancef .   Shrimp Extract     Sometimes breaks out in hives    Current Outpatient Medications  Medication Sig Dispense Refill   apixaban  (ELIQUIS ) 5 MG TABS tablet Take 1 tablet (5 mg total) by mouth 2 (two) times daily. 60 tablet 2   atorvastatin  (LIPITOR) 40 MG tablet Take 1 tablet (40 mg total) by mouth daily at 6 PM. 30 tablet 0   carvedilol  (COREG ) 6.25 MG tablet Take 6.25 mg by mouth.  clotrimazole-betamethasone (LOTRISONE) cream APPLY TO AFFECTED AREA TWICE A DAY 45 g 0   fluticasone  (FLONASE ) 50 MCG/ACT nasal spray Place 2 sprays into both nostrils daily as needed for allergies. Home med.     glimepiride  (AMARYL ) 2 MG tablet Take 2 mg by mouth daily with breakfast.     mupirocin  ointment (BACTROBAN ) 2 % Apply 1 Application topically daily. qd to open sores on right hand, right foot and right great toe 22 g 1   ondansetron  (ZOFRAN -ODT) 8 MG disintegrating tablet Take 1-2 tablets (8-16 mg total) by mouth every 8 (eight) hours as needed for Nausea for up to 7 days     pantoprazole  (PROTONIX ) 40 MG tablet Take 40 mg by mouth daily.     Sulfamethoxazole -Trimethoprim  (SULFAMETHOXAZOLE -TMP DS PO)      torsemide  (DEMADEX ) 10 MG tablet Take 10 mg  by mouth 2 (two) times daily.     doxazosin (CARDURA) 2 MG tablet Take 2 mg by mouth daily.     No current facility-administered medications for this visit.      REVIEW OF SYSTEMS (Negative unless checked)  Constitutional: [] Weight loss  [] Fever  [] Chills Cardiac: [] Chest pain   [] Chest pressure   [] Palpitations   [] Shortness of breath when laying flat   [] Shortness of breath at rest   [x] Shortness of breath with exertion. Vascular:  [] Pain in legs with walking   [] Pain in legs at rest   [] Pain in legs when laying flat   [] Claudication   [] Pain in feet when walking  [] Pain in feet at rest  [] Pain in feet when laying flat   [] History of DVT   [] Phlebitis   [] Swelling in legs   [] Varicose veins   [] Non-healing ulcers Pulmonary:   [] Uses home oxygen   [] Productive cough   [] Hemoptysis   [] Wheeze  [] COPD   [] Asthma Neurologic:  [] Dizziness  [] Blackouts   [] Seizures   [] History of stroke   [] History of TIA  [] Aphasia   [] Temporary blindness   [] Dysphagia   [x] Weakness or numbness in arms   [] Weakness or numbness in legs Musculoskeletal:  [x] Arthritis   [] Joint swelling   [x] Joint pain   [] Low back pain Hematologic:  [] Easy bruising  [] Easy bleeding   [] Hypercoagulable state   [x] Anemic  [] Hepatitis Gastrointestinal:  [] Blood in stool   [] Vomiting blood  [] Gastroesophageal reflux/heartburn   [] Abdominal pain Genitourinary:  [x] Chronic kidney disease   [] Difficult urination  [] Frequent urination  [] Burning with urination   [] Hematuria Skin:  [] Rashes   [x] Ulcers   [x] Wounds Psychological:  [] History of anxiety   []  History of major depression.    Physical Exam BP (!) 152/83   Pulse 64   Ht 6' (1.829 m)   Wt 211 lb (95.7 kg)   BMI 28.62 kg/m  Gen:  WD/WN, NAD Head: [place]/AT, No temporalis wasting. Ear/Nose/Throat: Hearing grossly intact, nares w/o erythema or drainage, oropharynx w/o Erythema/Exudate Eyes: Conjunctiva clear, sclera non-icteric  Neck: trachea midline.  No JVD.  Pulmonary:   Good air movement, respirations not labored, no use of accessory muscles  Cardiac: RRR, no JVD Vascular:  Vessel Right Left  Radial Palpable Palpable                          PT Palpable  Palpable   DP Palpable  Palpable    Gastrointestinal:. No masses, surgical incisions, or scars. Musculoskeletal: M/S 5/5 throughout.  Right great toe has a black eschar on the distal aspect  of the toe.  There is good capillary refill in the foot.  The right fourth finger has both a black eschar at the tip as well as about a 1 cm circular ulceration in the midportion of the finger between the interphalangeal joints.  This is on the top of the finger and is pale with atrophy in the region.  No clear exposed bone with a black eschar present. Neurologic: Sensation grossly intact in extremities.  Symmetrical.  Speech is fluent. Motor exam as listed above. Psychiatric: Judgment intact, Mood & affect appropriate for pt's clinical situation. Dermatologic: Right hand and foot wounds as above.    Radiology VAS US  UPPER EXTREMITY ARTERIAL DUPLEX Result Date: 11/22/2023  UPPER EXTREMITY DUPLEX STUDY Patient Name:  Tyler Wiley  Date of Exam:   11/19/2023 Medical Rec #: [id number]      Accession #:    [id number] Date of Birth: July 06, 1945       Patient Gender: M Patient Age:   82 years Exam Location:  [facility] Procedure:      VAS US  UPPER EXTREMITY ARTERIAL DUPLEX Referring Phys: ORVIN DARING --------------------------------------------------------------------------------  Indications: Right hand wound.  Performing Technologist: Leafy Gibes RVS  Examination Guidelines: A complete evaluation includes B-mode imaging, spectral Doppler, color Doppler, and power Doppler as needed of all accessible portions of each vessel. Bilateral testing is considered an integral part of a complete examination. Limited examinations for reoccurring indications may be performed as noted.  Right Doppler Findings:  +--------------+----------+---------+--------+--------+ Site          PSV (cm/s)Waveform StenosisComments +--------------+----------+---------+--------+--------+ Subclavian Mid71        triphasic                 +--------------+----------+---------+--------+--------+ Axillary      46        triphasic                 +--------------+----------+---------+--------+--------+ Brachial Prox 83        triphasic                 +--------------+----------+---------+--------+--------+ Brachial Dist 78        triphasic                 +--------------+----------+---------+--------+--------+ Radial Dist   44        triphasic                 +--------------+----------+---------+--------+--------+ Ulnar Dist    28        triphasic                 +--------------+----------+---------+--------+--------+ Palmar Arch   14        Dampened                  +--------------+----------+---------+--------+--------+ Abnormal PPG waveforms noted in the bilateral upper digits (right worse than left).  Summary:  Right: Normal arterial flow noted throughout the right upper        extremity with dampened flow noted in the palmar artery. *See table(s) above for measurements and observations.  Electronically signed by Selinda Gu MD on 11/22/2023 at 7:43:51 AM.    Final    VAS US  ABI WITH/WO TBI Result Date: 11/22/2023  LOWER EXTREMITY DOPPLER STUDY Patient Name:  Tyler Wiley  Date of Exam:   11/19/2023 Medical Rec #: [id number]      Accession #:    [id number] Date of Birth: [birth year]       Patient Gender:  M Patient Age:   31 years Exam Location:  [facility] Procedure:      VAS US  ABI WITH/WO TBI Referring Phys: --------------------------------------------------------------------------------  Indications: Peripheral artery disease.  Performing Technologist: Leafy Gibes RVS  Examination Guidelines: A complete evaluation includes at minimum, Doppler waveform signals and systolic blood  pressure reading at the level of bilateral brachial, anterior tibial, and posterior tibial arteries, when vessel segments are accessible. Bilateral testing is considered an integral part of a complete examination. Photoelectric Plethysmograph (PPG) waveforms and toe systolic pressure readings are included as required and additional duplex testing as needed. Limited examinations for reoccurring indications may be performed as noted.  ABI Findings: +---------+------------------+-----+---------+-------+ Right    Rt Pressure (mmHg)IndexWaveform Comment +---------+------------------+-----+---------+-------+ Brachial 148                                     +---------+------------------+-----+---------+-------+ PTA      249               1.60 triphasic        +---------+------------------+-----+---------+-------+ DP       241               1.54 triphasic        +---------+------------------+-----+---------+-------+ Great Toe143               0.92 Abnormal         +---------+------------------+-----+---------+-------+ +---------+------------------+-----+--------+-------+ Left     Lt Pressure (mmHg)IndexWaveformComment +---------+------------------+-----+--------+-------+ Brachial 156                                    +---------+------------------+-----+--------+-------+ PTA      231               1.48 biphasic        +---------+------------------+-----+--------+-------+ DP       224               1.44 biphasic        +---------+------------------+-----+--------+-------+ Great Toe156               1.00 Abnormal        +---------+------------------+-----+--------+-------+  Summary: Bilateral: Bilateral Doppler waveforms and TBIs suggest normal perfusion to the bilateral lower extremities. Bilateral ankle pressures demonstrated medial calcification.  *See table(s) above for measurements and observations.  Electronically signed by Selinda Gu MD on 11/22/2023 at 7:43:27  AM.    Final    CT Cervical Spine Wo Contrast Result Date: 10/26/2023 CLINICAL DATA:  fall EXAM: CT CERVICAL SPINE WITHOUT CONTRAST TECHNIQUE: Multidetector CT imaging of the cervical spine was performed without intravenous contrast. Multiplanar CT image reconstructions were also generated. RADIATION DOSE REDUCTION: This exam was performed according to the departmental dose-optimization program which includes automated exposure control, adjustment of the mA and/or kV according to patient size and/or use of iterative reconstruction technique. COMPARISON:  None Available. FINDINGS: Alignment: Straightening of the normal cervical lordosis. Skull base and vertebrae: No acute fracture. No primary bone lesion or focal pathologic process. Soft tissues and spinal canal: There is no paraspinous hematoma or soft tissue swelling present. There is moderate calcific atheromatous disease within the carotid bulbs bilaterally. Disc levels: There is mild chronic degenerative disc disease at C5-6 without significant spinal canal or neural foraminal stenosis. The left facets at C2-3 are fused. Upper chest: The lung apices are  clear. Other: None. IMPRESSION: 1. Mild multilevel degenerative disc disease and facet arthrosis, without evidence of acute traumatic injury. Electronically Signed   By: Evalene Coho M.D.   On: 10/26/2023 17:17   CT Head Wo Contrast Result Date: 10/26/2023 CLINICAL DATA:  Head trauma, minor (Age >= 65y) EXAM: CT HEAD WITHOUT CONTRAST TECHNIQUE: Contiguous axial images were obtained from the base of the skull through the vertex without intravenous contrast. RADIATION DOSE REDUCTION: This exam was performed according to the departmental dose-optimization program which includes automated exposure control, adjustment of the mA and/or kV according to patient size and/or use of iterative reconstruction technique. COMPARISON:  CT of the head dated May 09, 2018. FINDINGS: Brain: Chronic encephalomalacia  changes are again demonstrated the right parietal lobe. There is no evidence of acute intracranial injury. The brain is otherwise unremarkable. No evidence of hemorrhage, mass or hydrocephalus. Vascular: Mild to moderate calcific atheromatous disease within the carotid siphons. Skull: Intact and unremarkable. Sinuses/Orbits: Mild circumferential mucosal disease within the left maxillary sinus. Mild ethmoid air cell disease. Other: None. IMPRESSION: 1. Chronic encephalomalacia changes within the right parietal lobe. No evidence of acute intracranial injury. Electronically Signed   By: Evalene Coho M.D.   On: 10/26/2023 17:10   DG Knee Complete 4 Views Right Result Date: 10/26/2023 CLINICAL DATA:  Mechanical fall tripping over a stool. Right hip and knee pain. EXAM: RIGHT KNEE - COMPLETE 4+ VIEW COMPARISON:  None Available. FINDINGS: The bones are subjectively under mineralized. No evidence of acute fracture. No dislocation. Joint spaces are preserved. Trace joint effusion. Extensive peripheral vascular calcifications. IMPRESSION: 1. No acute fracture or dislocation of the right knee. Trace joint effusion. 2. Extensive peripheral vascular calcifications. Electronically Signed   By: Andrea Gasman M.D.   On: 10/26/2023 16:22   DG Hip Unilat W or Wo Pelvis 2-3 Views Right Result Date: 10/26/2023 CLINICAL DATA:  Mechanical fall tripping over a stool. Right hip and knee pain. EXAM: DG HIP (WITH OR WITHOUT PELVIS) 2-3V RIGHT COMPARISON:  None Available. FINDINGS: The bones are subjectively under mineralized. No acute fracture of the pelvis or right hip. No hip dislocation. Pubic rami are intact. Pubic symphysis and sacroiliac joints are congruent. Extensive vascular calcifications. IMPRESSION: 1. No acute fracture of the pelvis or right hip. 2. Osteopenia/osteoporosis. Electronically Signed   By: Andrea Gasman M.D.   On: 10/26/2023 16:19    Labs Recent Results (from the past 2160 hours)  Comprehensive  metabolic panel     Status: Abnormal   Collection Time: 09/09/23 11:21 AM  Result Value Ref Range   Sodium 136 135 - 145 mmol/L   Potassium 3.7 3.5 - 5.1 mmol/L   Chloride 100 98 - 111 mmol/L   CO2 22 22 - 32 mmol/L   Glucose, Bld 252 (H) 70 - 99 mg/dL    Comment: Glucose reference range applies only to samples taken after fasting for at least 8 hours.   BUN 56 (H) 8 - 23 mg/dL   Creatinine, Ser 89.55 (H) 0.61 - 1.24 mg/dL   Calcium  9.6 8.9 - 10.3 mg/dL   Total Protein 6.5 6.5 - 8.1 g/dL   Albumin 3.3 (L) 3.5 - 5.0 g/dL   AST 14 (L) 15 - 41 U/L   ALT 15 0 - 44 U/L   Alkaline Phosphatase 89 38 - 126 U/L   Total Bilirubin 0.6 0.0 - 1.2 mg/dL   GFR, Estimated 5 (L) >60 mL/min    Comment: (NOTE) Calculated using the  CKD-EPI Creatinine Equation (2021)    Anion gap 14 5 - 15    Comment: Performed at [facility], [address] [place], [place] [address]  CBC     Status: Abnormal   Collection Time: 09/09/23 11:21 AM  Result Value Ref Range   WBC 12.9 (H) 4.0 - 10.5 K/uL   RBC 3.25 (L) 4.22 - 5.81 MIL/uL   Hemoglobin 10.2 (L) 13.0 - 17.0 g/dL   HCT 69.7 (L) 60.9 - 47.9 %   MCV 92.9 80.0 - 100.0 fL   MCH 31.4 26.0 - 34.0 pg   MCHC 33.8 30.0 - 36.0 g/dL   RDW 85.9 88.4 - 84.4 %   Platelets 152 150 - 400 K/uL   nRBC 0.0 0.0 - 0.2 %    Comment: Performed at [facility], [address], [place], [place] [address]  Troponin I (High Sensitivity)     Status: Abnormal   Collection Time: 09/09/23 11:21 AM  Result Value Ref Range   Troponin I (High Sensitivity) 34 (H) <18 ng/L    Comment: (NOTE) Elevated high sensitivity troponin I (hsTnI) values and significant  changes across serial measurements may suggest ACS but many other  chronic and acute conditions are known to elevate hsTnI results.  Refer to the Links section for chest pain algorithms and additional  guidance. Performed at [facility], [address] [place], [place] [address]    Urinalysis, Routine w reflex microscopic -Urine, Clean Catch     Status: Abnormal   Collection Time: 09/09/23  1:48 PM  Result Value Ref Range   Color, Urine YELLOW (A) YELLOW   APPearance HAZY (A) CLEAR   Specific Gravity, Urine 1.015 1.005 - 1.030   pH 5.0 5.0 - 8.0   Glucose, UA 50 (A) NEGATIVE mg/dL   Hgb urine dipstick SMALL (A) NEGATIVE   Bilirubin Urine NEGATIVE NEGATIVE   Ketones, ur NEGATIVE NEGATIVE mg/dL   Protein, ur >=699 (A) NEGATIVE mg/dL   Nitrite NEGATIVE NEGATIVE   Leukocytes,Ua TRACE (A) NEGATIVE   RBC / HPF 0-5 0 - 5 RBC/hpf   WBC, UA 6-10 0 - 5 WBC/hpf   Bacteria, UA RARE (A) NONE SEEN   Squamous Epithelial / HPF 0-5 0 - 5 /HPF   Mucus PRESENT     Comment: Performed at [facility], [address], [place], [place] [address]  Troponin I (High Sensitivity)     Status: Abnormal   Collection Time: 09/09/23  2:31 PM  Result Value Ref Range   Troponin I (High Sensitivity) 31 (H) <18 ng/L    Comment: (NOTE) Elevated high sensitivity troponin I (hsTnI) values and significant  changes across serial measurements may suggest ACS but many other  chronic and acute conditions are known to elevate hsTnI results.  Refer to the Links section for chest pain algorithms and additional  guidance. Performed at [facility], [address] [place] [place], [place] [address]   Surgical pathology     Status: None   Collection Time: 10/06/23 12:00 AM  Result Value Ref Range   SURGICAL PATHOLOGY      SURGICAL PATHOLOGY [facility] [address] [place], [place] [address] Telephone [phone] or [phone] Fax [phone]  REPORT OF DERMATOPATHOLOGY   Accession #: [id number] Patient Name: Tyler Wiley, Tyler Wiley Visit # : [id number]  MRN: [id number] Physician: Hester Lenis DOB/Age [birth year] (Age: 60) Gender: M Collected Date: 10/06/2023 Received Date: 10/07/2023  FINAL DIAGNOSIS  1. Skin, L mandible :       BASAL  CELL CARCINOMA, MICRONODULAR PATTERN       DATE SIGNED OUT: 10/11/2023 ELECTRONIC SIGNATURE : MATTOCH M.D., OCTAVIA , Pathologist  MICROSCOPIC DESCRIPTION 1. There is a proliferation of atypical basaloid cells arranged predominantly in a micronodular pattern in the dermis.  CASE COMMENTS STAINS USED IN DIAGNOSIS: H&E H&E H&E H&E    CLINICAL HISTORY  SPECIMEN(S) OBTAINED 1. Skin, L Mandible  SPECIMEN COMMENTS: 1. 2.2 x 1.5cm pearly plaque, check margins, EDC today SPECIMEN CLINICAL INFORMATION : 1. Neoplasm of skin, R/O BCC    Gross Description 1. Formalin fixed specimen received:  18 X 13 X 1 MM, (7 P) (2 B) ( nn ) margins inked.=a,b All identifiable tissue submitted; apparent keratin      debris/crust fragments left in container.      a=3      b=4      Additional formalin fixed specimen received in bottle:  15 X 12 X 1 MM, (6 P) (2      B) margins inked. = c,d    ( nn )      c-d=3        Report signed out from the following location(s) [facility] [address] [place], [place] [address] CLIA #: [id number]  [facility] [address] [place], [place] [address] CLIA #: [id number]   Bladder Scan (Post Void Residual) in office     Status: None   Collection Time: 11/17/23  9:11 AM  Result Value Ref Range   Scan Result 99   Urinalysis, Complete     Status: Abnormal   Collection Time: 11/17/23  9:47 AM  Result Value Ref Range   Specific Gravity, UA 1.015 1.005 - 1.030   pH, UA 6.0 5.0 - 7.5   Color, UA Yellow Yellow   Appearance Ur Clear Clear   Leukocytes,UA Negative Negative   Protein,UA 3+ (A) Negative/Trace   Glucose, UA Negative Negative   Ketones, UA Negative Negative   RBC, UA 1+ (A) Negative   Bilirubin, UA Negative Negative   Urobilinogen, Ur 0.2 0.2 - 1.0 mg/dL   Nitrite, UA Negative Negative   Microscopic Examination See below:     Comment: Microscopic was indicated and was performed.  Microscopic  Examination     Status: Abnormal   Collection Time: 11/17/23  9:47 AM   Urine  Result Value Ref Range   WBC, UA 0-5 0 - 5 /hpf   RBC, Urine 3-10 (A) 0 - 2 /hpf   Epithelial Cells (non renal) 0-10 0 - 10 /hpf   Bacteria, UA None seen None seen/Few    Assessment/Plan:  HTN (hypertension) blood pressure control important in reducing the progression of atherosclerotic disease. On appropriate oral medications.   Paroxysmal atrial fibrillation (HCC) On Eliquis .  Type 2 diabetes mellitus with chronic kidney disease on chronic dialysis, without long-term current use of insulin  (HCC) blood glucose control important in reducing the progression of atherosclerotic disease. Also, involved in wound healing. On appropriate medications.   ESRD needing dialysis (HCC) Increase his atherosclerotic risk and also decrease his wound healing potential.  Skin ulcer of hand (HCC) The patient has markedly diminished perfusion to his right hand.  With ulceration, this is a critical and limb threatening situation.  This appears to be largely small vessel disease, and I am not sure there will be much we can do to help, but an angiogram still  appropriate for further evaluation and potential revascularization.  This is a very serious and limb threatening situation and he understands this.  Angiogram will be scheduled in the near future at his convenience.  PAD (peripheral artery disease) (HCC) ABIs showed triphasic waveforms and normal ABIs bilaterally.  His lower extremity perfusion appears to be adequate at this point.  Atherosclerosis of native arteries of the extremities with ulceration (HCC) His ABIs were falsely elevated due to medial calcification but his waveforms were strong and his digit pressures were normal bilaterally in the lower extremities consistent with no arterial insufficiency.  Continue local wound care.  No role for lower extremity intervention at this time.      Selinda Gu 11/22/2023,  9:51 AM   This note was created with Dragon medical transcription system.  Any errors from dictation are unintentional.

## 2023-11-19 NOTE — Progress Notes (Unsigned)
 Patient ID: Tyler Wiley, male   DOB: Oct 03, 1945, 78 y.o.   MRN: [id number]  No chief complaint on file.   HPI Tyler Wiley is a 78 y.o. male.  I am asked to see the patient by Dr. Lenon for evaluation of ***.     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    Social History   Tobacco Use   Smoking status: Former    Types: Cigarettes   Smokeless tobacco: Never  Substance Use Topics   Alcohol use: Yes    Comment: rare   Drug use: Never     Allergies  Allergen Reactions   Amlodipine  Swelling   Cefuroxime     Other: Mouth ulcers, tongue swollen.  Tolerated Ancef .   Shrimp Extract     Sometimes breaks out in hives    Current Outpatient Medications  Medication Sig Dispense Refill   apixaban  (ELIQUIS ) 5 MG TABS tablet Take 1 tablet (5 mg total) by mouth 2 (two) times daily. 60 tablet 2   atorvastatin  (LIPITOR) 40 MG tablet Take 1 tablet (40 mg total) by mouth daily at 6 PM. 30 tablet 0   carvedilol  (COREG ) 6.25 MG tablet Take 6.25 mg by mouth.     clotrimazole-betamethasone (LOTRISONE) cream APPLY TO AFFECTED AREA TWICE A DAY 45 g 0   doxazosin (CARDURA) 2  MG tablet Take 2 mg by mouth daily.     fluticasone  (FLONASE ) 50 MCG/ACT nasal spray Place 2 sprays into both nostrils daily as needed for allergies. Home med.     glimepiride  (AMARYL ) 2 MG tablet Take 2 mg by mouth daily with breakfast.     mupirocin  ointment (BACTROBAN ) 2 % Apply 1 Application topically daily. qd to open sores on right hand, right foot and right great toe 22 g 1   ondansetron  (ZOFRAN -ODT) 8 MG disintegrating tablet Take 1-2 tablets (8-16 mg total) by mouth every 8 (eight) hours as needed for Nausea for up to 7 days     pantoprazole  (PROTONIX ) 40 MG tablet Take 40 mg by mouth daily.     Sulfamethoxazole -Trimethoprim  (SULFAMETHOXAZOLE -TMP DS PO)      torsemide  (DEMADEX ) 10 MG tablet Take 10 mg by mouth 2 (two) times daily.     No current facility-administered medications for this visit.      REVIEW OF SYSTEMS (Negative unless checked)  Constitutional: [] Weight loss  [] Fever  [] Chills Cardiac: [] Chest pain   [] Chest pressure   [] Palpitations   [] Shortness of breath when laying flat   [] Shortness  of breath at rest   [x] Shortness of breath with exertion. Vascular:  [] Pain in legs with walking   [] Pain in legs at rest   [] Pain in legs when laying flat   [] Claudication   [] Pain in feet when walking  [] Pain in feet at rest  [] Pain in feet when laying flat   [] History of DVT   [] Phlebitis   [] Swelling in legs   [] Varicose veins   [] Non-healing ulcers Pulmonary:   [] Uses home oxygen   [] Productive cough   [] Hemoptysis   [] Wheeze  [] COPD   [] Asthma Neurologic:  [] Dizziness  [] Blackouts   [] Seizures   [] History of stroke   [] History of TIA  [] Aphasia   [] Temporary blindness   [] Dysphagia   [x] Weakness or numbness in arms   [] Weakness or numbness in legs Musculoskeletal:  [x] Arthritis   [] Joint swelling   [x] Joint pain   [] Low back pain Hematologic:  [] Easy bruising  [] Easy bleeding   [] Hypercoagulable state   [x] Anemic  [] Hepatitis Gastrointestinal:  [] Blood in stool   [] Vomiting blood   [] Gastroesophageal reflux/heartburn   [] Abdominal pain Genitourinary:  [x] Chronic kidney disease   [] Difficult urination  [] Frequent urination  [] Burning with urination   [] Hematuria Skin:  [] Rashes   [x] Ulcers   [x] Wounds Psychological:  [] History of anxiety   []  History of major depression.    Physical Exam There were no vitals taken for this visit. Gen:  WD/WN, NAD Head: /AT, No temporalis wasting. Prominent temp pulse not noted. Ear/Nose/Throat: Hearing grossly intact, nares w/o erythema or drainage, oropharynx w/o Erythema/Exudate Eyes: Conjunctiva clear, sclera non-icteric  Neck: trachea midline.  No JVD.  Pulmonary:  Good air movement, respirations not labored, no use of accessory muscles  Cardiac: RRR, no JVD Vascular:  Vessel Right Left  Radial Palpable Palpable                          PT    DP     Gastrointestinal:. No masses, surgical incisions, or scars. Musculoskeletal: M/S 5/5 throughout.  Extremities without ischemic changes.  No deformity or atrophy. *** edema. Neurologic: Sensation grossly intact in extremities.  Symmetrical.  Speech is fluent. Motor exam as listed above. Psychiatric: Judgment intact, Mood & affect appropriate for pt's clinical situation. Dermatologic: No rashes or ulcers noted.  No cellulitis or open wounds.    Radiology CT Cervical Spine Wo Contrast Result Date: 10/26/2023 CLINICAL DATA:  fall EXAM: CT CERVICAL SPINE WITHOUT CONTRAST TECHNIQUE: Multidetector CT imaging of the cervical spine was performed without intravenous contrast. Multiplanar CT image reconstructions were also generated. RADIATION DOSE REDUCTION: This exam was performed according to the departmental dose-optimization program which includes automated exposure control, adjustment of the mA and/or kV according to patient size and/or use of iterative reconstruction technique. COMPARISON:  None Available. FINDINGS: Alignment: Straightening of the normal cervical lordosis.  Skull base and vertebrae: No acute fracture. No primary bone lesion or focal pathologic process. Soft tissues and spinal canal: There is no paraspinous hematoma or soft tissue swelling present. There is moderate calcific atheromatous disease within the carotid bulbs bilaterally. Disc levels: There is mild chronic degenerative disc disease at C5-6 without significant spinal canal or neural foraminal stenosis. The left facets at C2-3 are fused. Upper chest: The lung apices are clear. Other: None. IMPRESSION: 1. Mild multilevel degenerative disc disease and facet arthrosis, without evidence of acute traumatic injury. Electronically Signed   By: Evalene Coho M.D.   On: 10/26/2023 17:17  of the toe.  There is good capillary refill in the foot.  The right fourth finger has both a black eschar at the tip as well as about a 1 cm circular ulceration in the midportion of the finger between the interphalangeal joints.  This is on the top of the finger and is pale with atrophy in the region.  No clear exposed bone with a black eschar present. Neurologic: Sensation grossly intact in extremities.  Symmetrical.  Speech is fluent. Motor exam as listed above. Psychiatric: Judgment intact, Mood & affect appropriate for pt's clinical situation. Dermatologic: Right hand and foot wounds as above.    Radiology VAS US  UPPER EXTREMITY ARTERIAL DUPLEX Result Date: 11/22/2023  UPPER EXTREMITY DUPLEX STUDY Patient Name:  Tyler Wiley  Date of Exam:   11/19/2023 Medical Rec #: [id number]      Accession #:    [id number] Date of Birth: July 06, 1945       Patient Gender: M Patient Age:   82 years Exam Location:  [facility] Procedure:      VAS US  UPPER EXTREMITY ARTERIAL DUPLEX Referring Phys: ORVIN DARING --------------------------------------------------------------------------------  Indications: Right hand wound.  Performing Technologist: Leafy Gibes RVS  Examination Guidelines: A complete evaluation includes B-mode imaging, spectral Doppler, color Doppler, and power Doppler as needed of all accessible portions of each vessel. Bilateral testing is considered an integral part of a complete examination. Limited examinations for reoccurring indications may be performed as noted.  Right Doppler Findings:  +--------------+----------+---------+--------+--------+ Site          PSV (cm/s)Waveform StenosisComments +--------------+----------+---------+--------+--------+ Subclavian Mid71        triphasic                 +--------------+----------+---------+--------+--------+ Axillary      46        triphasic                 +--------------+----------+---------+--------+--------+ Brachial Prox 83        triphasic                 +--------------+----------+---------+--------+--------+ Brachial Dist 78        triphasic                 +--------------+----------+---------+--------+--------+ Radial Dist   44        triphasic                 +--------------+----------+---------+--------+--------+ Ulnar Dist    28        triphasic                 +--------------+----------+---------+--------+--------+ Palmar Arch   14        Dampened                  +--------------+----------+---------+--------+--------+ Abnormal PPG waveforms noted in the bilateral upper digits (right worse than left).  Summary:  Right: Normal arterial flow noted throughout the right upper        extremity with dampened flow noted in the palmar artery. *See table(s) above for measurements and observations.  Electronically signed by Selinda Gu MD on 11/22/2023 at 7:43:51 AM.    Final    VAS US  ABI WITH/WO TBI Result Date: 11/22/2023  LOWER EXTREMITY DOPPLER STUDY Patient Name:  Tyler Wiley  Date of Exam:   11/19/2023 Medical Rec #: [id number]      Accession #:    [id number] Date of Birth: [birth year]       Patient Gender:  M Patient Age:   31 years Exam Location:  [facility] Procedure:      VAS US  ABI WITH/WO TBI Referring Phys: --------------------------------------------------------------------------------  Indications: Peripheral artery disease.  Performing Technologist: Leafy Gibes RVS  Examination Guidelines: A complete evaluation includes at minimum, Doppler waveform signals and systolic blood  pressure reading at the level of bilateral brachial, anterior tibial, and posterior tibial arteries, when vessel segments are accessible. Bilateral testing is considered an integral part of a complete examination. Photoelectric Plethysmograph (PPG) waveforms and toe systolic pressure readings are included as required and additional duplex testing as needed. Limited examinations for reoccurring indications may be performed as noted.  ABI Findings: +---------+------------------+-----+---------+-------+ Right    Rt Pressure (mmHg)IndexWaveform Comment +---------+------------------+-----+---------+-------+ Brachial 148                                     +---------+------------------+-----+---------+-------+ PTA      249               1.60 triphasic        +---------+------------------+-----+---------+-------+ DP       241               1.54 triphasic        +---------+------------------+-----+---------+-------+ Great Toe143               0.92 Abnormal         +---------+------------------+-----+---------+-------+ +---------+------------------+-----+--------+-------+ Left     Lt Pressure (mmHg)IndexWaveformComment +---------+------------------+-----+--------+-------+ Brachial 156                                    +---------+------------------+-----+--------+-------+ PTA      231               1.48 biphasic        +---------+------------------+-----+--------+-------+ DP       224               1.44 biphasic        +---------+------------------+-----+--------+-------+ Great Toe156               1.00 Abnormal        +---------+------------------+-----+--------+-------+  Summary: Bilateral: Bilateral Doppler waveforms and TBIs suggest normal perfusion to the bilateral lower extremities. Bilateral ankle pressures demonstrated medial calcification.  *See table(s) above for measurements and observations.  Electronically signed by Selinda Gu MD on 11/22/2023 at 7:43:27  AM.    Final    CT Cervical Spine Wo Contrast Result Date: 10/26/2023 CLINICAL DATA:  fall EXAM: CT CERVICAL SPINE WITHOUT CONTRAST TECHNIQUE: Multidetector CT imaging of the cervical spine was performed without intravenous contrast. Multiplanar CT image reconstructions were also generated. RADIATION DOSE REDUCTION: This exam was performed according to the departmental dose-optimization program which includes automated exposure control, adjustment of the mA and/or kV according to patient size and/or use of iterative reconstruction technique. COMPARISON:  None Available. FINDINGS: Alignment: Straightening of the normal cervical lordosis. Skull base and vertebrae: No acute fracture. No primary bone lesion or focal pathologic process. Soft tissues and spinal canal: There is no paraspinous hematoma or soft tissue swelling present. There is moderate calcific atheromatous disease within the carotid bulbs bilaterally. Disc levels: There is mild chronic degenerative disc disease at C5-6 without significant spinal canal or neural foraminal stenosis. The left facets at C2-3 are fused. Upper chest: The lung apices are  INGERLISA , Pathologist  MICROSCOPIC DESCRIPTION 1. There is a proliferation of atypical basaloid cells arranged predominantly in a micronodular pattern in the dermis.  CASE COMMENTS STAINS USED IN DIAGNOSIS: H&E H&E H&E H&E    CLINICAL HISTORY  SPECIMEN(S) OBTAINED 1. Skin, L Mandible  SPECIMEN COMMENTS: 1. 2.2 x 1.5cm pearly plaque, check margins, EDC today SPECIMEN CLINICAL INFORMATION : 1. Neoplasm of skin, R/O BCC    Gross Description 1. Formalin fixed specimen received:  18 X 13 X 1 MM, (7 P) (2 B) ( nn )  margins inked.=a,b All identifiable tissue submitted; apparent keratin      debris/crust fragments left in container.      a=3      b=4      Additional formalin fixed specimen received in bottle:  15 X 12 X 1 MM, (6 P) (2      B) margins inked. = c,d    ( nn )      c-d=3        Report signed out from the following location(s) [place]. [facility] [address] [place], [place] [address] CLIA #: [id number]  [facility] [address] [place], [place] [address] CLIA #: [id number]   Bladder Scan (Post Void Residual) in office     Status: None   Collection Time: 11/17/23  9:11 AM  Result Value Ref Range   Scan Result 99   Urinalysis, Complete     Status: Abnormal   Collection Time: 11/17/23  9:47 AM  Result Value Ref Range   Specific Gravity, UA 1.015 1.005 - 1.030   pH, UA 6.0 5.0 - 7.5   Color, UA Yellow Yellow   Appearance Ur Clear Clear   Leukocytes,UA Negative Negative   Protein,UA 3+ (A) Negative/Trace   Glucose, UA Negative Negative   Ketones, UA Negative Negative   RBC, UA 1+ (A) Negative   Bilirubin, UA Negative Negative   Urobilinogen, Ur 0.2 0.2 - 1.0 mg/dL   Nitrite, UA Negative Negative   Microscopic Examination See below:     Comment: Microscopic was indicated and was performed.  Microscopic Examination     Status: Abnormal   Collection Time: 11/17/23  9:47 AM   Urine  Result Value Ref Range   WBC, UA 0-5 0 - 5 /hpf   RBC, Urine 3-10 (A) 0 - 2 /hpf   Epithelial Cells (non renal) 0-10 0 - 10 /hpf   Bacteria, UA None seen None seen/Few    Assessment/Plan:  HTN (hypertension) blood pressure control important in reducing the progression of atherosclerotic disease. On appropriate oral medications.   Paroxysmal atrial fibrillation (HCC) On Eliquis .  Type 2 diabetes mellitus with chronic kidney disease on chronic dialysis, without long-term current use of insulin  (HCC) blood glucose control important in reducing the  progression of atherosclerotic disease. Also, involved in wound healing. On appropriate medications.   ESRD needing dialysis (HCC) Increase his atherosclerotic risk and also decrease his wound healing potential.  Skin ulcer of hand (HCC) The patient has markedly diminished perfusion to his right hand.  With ulceration, this is a critical and limb threatening situation.  This appears to be largely small vessel disease, and I am not sure there will be much we can do to help, but an angiogram still appropriate for further evaluation and potential revascularization.  This is a very serious and limb threatening situation and he understands this.  Angiogram will be scheduled in the near future at  his convenience.  PAD (peripheral artery disease) (HCC) ABIs showed triphasic waveforms and normal ABIs bilaterally.  His lower extremity perfusion appears to be adequate at this point.      Selinda Gu 11/19/2023, 11:37 AM   This note was created with Dragon medical transcription system.  Any errors from dictation are unintentional.

## 2023-11-19 NOTE — Assessment & Plan Note (Signed)
 blood glucose control important in reducing the progression of atherosclerotic disease. Also, involved in wound healing. On appropriate medications.

## 2023-11-19 NOTE — Assessment & Plan Note (Signed)
 The patient has markedly diminished perfusion to his right hand.  With ulceration, this is a critical and limb threatening situation.  This appears to be largely small vessel disease, and I am not sure there will be much we can do to help, but an angiogram still appropriate for further evaluation and potential revascularization.  This is a very serious and limb threatening situation and he understands this.  Angiogram will be scheduled in the near future at his convenience.

## 2023-11-19 NOTE — Assessment & Plan Note (Signed)
 On Eliquis 

## 2023-11-19 NOTE — Assessment & Plan Note (Signed)
 ABIs showed triphasic waveforms and normal ABIs bilaterally.  His lower extremity perfusion appears to be adequate at this point.

## 2023-11-22 ENCOUNTER — Telehealth: Payer: Self-pay

## 2023-11-22 ENCOUNTER — Ambulatory Visit: Admitting: Physician Assistant

## 2023-11-22 VITALS — BP 121/69 | HR 79 | Ht 72.0 in | Wt 210.5 lb

## 2023-11-22 DIAGNOSIS — R3912 Poor urinary stream: Secondary | ICD-10-CM | POA: Diagnosis not present

## 2023-11-22 LAB — BLADDER SCAN AMB NON-IMAGING

## 2023-11-22 NOTE — Telephone Encounter (Signed)
 Returned patients call. Pt c/o urgency without being able to urinate since 2am. Pateint states he is not in pain just discomfort but tolerable. I was able to book a same day appt with Sam PA at 2pm today.

## 2023-11-22 NOTE — Assessment & Plan Note (Signed)
 His ABIs were falsely elevated due to medial calcification but his waveforms were strong and his digit pressures were normal bilaterally in the lower extremities consistent with no arterial insufficiency.  Continue local wound care.  No role for lower extremity intervention at this time.

## 2023-11-22 NOTE — Patient Instructions (Signed)
 Angiogram  An angiogram is a procedure used to examine the blood vessels. In this procedure, contrast dye is injected through a soft tube (catheter) into an artery. X-rays are then taken, which show if there is a blockage or problem in a blood vessel. The catheter may be inserted in: The groin area. This is the most common. The fold of the arm, near the elbow. The wrist. Tell a health care provider about: Any allergies you have, including allergies to medicines, shellfish, contrast dye, or iodine. All medicines you are taking, including vitamins, herbs, eye drops, creams, and over-the-counter medicines. Any problems you or family members have had with anesthesia. Any bleeding problems you have. Any surgeries you have had. Any medical conditions you have or have had, including any kidney problems or kidney failure. Whether you are pregnant or may be pregnant. Whether you are breastfeeding. Any condition that may increase your stress and prevent you from lying still. This includes anxiety disorders or chronic pain. What are the risks? Your health care provider will talk with you about risks. These may include: Infection. Bleeding and bruising. Allergic reactions to medicines or dyes, including the contrast dye used. Damage to nearby structures or organs, including damage to blood vessels and kidney damage from the contrast dye. Blood clots that can lead to a stroke or heart attack. Death. What happens before the procedure? When to stop eating and drinking Follow instructions from your health care provider about what you may eat and drink before your procedure. These may include: 8 hours before your procedure Stop eating most foods. Do not eat meat, fried foods, or fatty foods. Eat only light foods, such as toast or crackers. All liquids are okay except energy drinks and alcohol. 6 hours before your procedure Stop eating. Drink only clear liquids, such as water, clear fruit juice,  black coffee, plain tea, and sports drinks. Do not drink energy drinks or alcohol. 2 hours before your procedure Stop drinking all liquids. You may be allowed to take medicines with small sips of water. If you do not follow your health care provider's instructions, your procedure may be delayed or canceled. Medicines Ask your health care provider about: Changing or stopping your regular medicines. These include any diabetes medicines or blood thinners you take. Taking medicines such as aspirin  and ibuprofen. These medicines can thin your blood. Do not take them unless your health care provider tells you to. Taking over-the-counter medicines, vitamins, herbs, and supplements. Surgery safety Ask your health care provider: How your insertion site will be marked. What steps will be taken to help prevent infection. These may include: Removing hair at the insertion site. Washing skin with a germ-killing soap. General instructions Do not use any products that contain nicotine or tobacco for at least 4 weeks before the procedure. These products include cigarettes, chewing tobacco, and vaping devices, such as e-cigarettes. If you need help quitting, ask your health care provider. You may have blood samples taken. If you will be going home right after the procedure, plan to have a responsible adult: Take you home from the hospital or clinic. You will not be allowed to drive. Care for you for the time you are told. What happens during the procedure? You will lie on your back on an X-ray table. You may be strapped to the table if it is tilted. An IV will be inserted into one of your veins. Electrodes may be placed on your chest to monitor your heart rate during the procedure.  You will be given one or both of the following: A sedative. This helps you relax. Anesthesia. This will numb the area where the catheter will be inserted. A small incision will be made for catheter insertion. The catheter  will be inserted into an artery using a guide wire. An X-ray procedure (fluoroscopy) will be used to help guide the catheter to the blood vessel to be examined. A contrast dye will then be injected into the catheter, and X-rays will be taken. The contrast will help to show where any narrowing or blockages are located in the blood vessels. You may feel flushed as the contrast dye is injected. Tell your health care provider if you develop chest pain or trouble breathing. After the fluoroscopy is complete, the catheter will be removed. Pressure will be applied to stop bleeding. A closure device may also be used. A bandage (dressing) will be placed over the site where the catheter was inserted. The procedure may vary among health care providers and hospitals. What happens after the procedure? Your blood pressure, heart rate, breathing rate, and blood oxygen level will be monitored until you leave the hospital or clinic. You will be kept in bed lying flat for a period of time. If the catheter was inserted through your leg, you will be instructed not to bend or cross your legs. The insertion area and the pulse in your feet or wrist will be checked often. You will be instructed to drink plenty of fluids. This will help wash the contrast dye out of your body. You may have more blood tests and X-rays. You may also have a test that records the electrical activity of your heart (electrocardiogram, or ECG). If you were given a sedative, do not drive or operate machinery until your health care provider says that it is safe. You may have to avoid lifting. Ask your health care provider how much you can safely lift. It is up to you to get your test results. Ask your health care provider, or the department that is doing the test, when your results will be ready. This information is not intended to replace advice given to you by your health care provider. Make sure you discuss any questions you have with your health  care provider. Document Revised: 10/15/2021 Document Reviewed: 10/15/2021 Elsevier Patient Education  2024 [employer].

## 2023-11-22 NOTE — Progress Notes (Signed)
 11/22/2023 5:14 PM   Jackee KATHEE Parkin 08-01-1945 [id number]  CC: Chief Complaint  Patient presents with   Establish Care   Urinary Retention   HPI: ANTWINE AGOSTO is a 78 y.o. male with PMH ESRD on PD, BPH s/p UroLift in 2020, and recurrent UTI/bacteriuria who presents today for evaluation of possible urinary retention.  He is accompanied today by his wife, who contributes to HPI.   I saw him in clinic last week for the same.  Measured residual was 200 mL.  UA was rather bland.  We decided to manage this conservatively and plan for cystoscopy with Dr. Twylla, which is scheduled in about 3 weeks.  Today he reports continued intermittent difficulty urinating and inconsistent urinary output.  He is scheduled for right upper extremity angiography with Dr. Marea in 3 days.  PVR 16 mL.  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  Tolerated Ancef .   Shrimp Extract    Sometimes breaks out in hives        Medication List        Accurate as of November 22, 2023  5:14 PM. If you have any questions, ask your nurse or doctor.           STOP taking these medications    doxazosin 2 MG tablet Commonly known as: CARDURA Stopped by: Onetha Gaffey       TAKE these medications    apixaban  5 MG Tabs tablet Commonly known as: ELIQUIS  Take 1 tablet (5 mg total) by mouth 2 (two) times daily.   atorvastatin  40 MG tablet Commonly known as: LIPITOR Take 1 tablet (40 mg total) by mouth daily at 6 PM.   carvedilol  6.25 MG tablet Commonly known as: COREG  Take 6.25 mg by mouth.   clotrimazole-betamethasone cream Commonly known as: LOTRISONE APPLY TO AFFECTED AREA TWICE A DAY   fluticasone  50 MCG/ACT nasal spray Commonly known as: Flonase  Place 2 sprays into both nostrils daily as needed for allergies. Home med.   glimepiride  2 MG tablet Commonly known as: AMARYL  Take 2 mg by mouth daily with breakfast.   mupirocin  ointment 2 % Commonly known as: BACTROBAN  Apply 1 Application topically daily. qd to open sores on right hand, right foot and right great toe   ondansetron  8 MG disintegrating tablet Commonly known as: ZOFRAN -ODT Take 1-2 tablets (8-16 mg total) by mouth every 8 (eight) hours as needed for Nausea for up to 7 days   pantoprazole  40  MG tablet Commonly known as: PROTONIX  Take 40 mg by mouth daily.   SULFAMETHOXAZOLE -TMP DS PO   torsemide  10 MG tablet Commonly known as: DEMADEX  Take 10 mg by mouth 2 (two) times daily.        Allergies:  Allergies  Allergen Reactions   Amlodipine  Swelling   Cefuroxime     Other: Mouth ulcers, tongue swollen.  Tolerated Ancef .   Shrimp Extract     Sometimes breaks out in hives    Family History: Family History  Problem Relation Age of Onset   CAD Father     Social History:   reports that he has quit smoking. His smoking use included cigarettes. He has never used smokeless tobacco. He reports current alcohol use. He reports that he does not use drugs.  Physical Exam: BP 121/69 (BP Location: Left Arm, Patient Position: Sitting, Cuff  Size: Normal)   Pulse 79   Ht 6' (1.829 m)   Wt 210 lb 8 oz (95.5 kg)   SpO2 96%   BMI 28.55 kg/m   Constitutional:  Alert and oriented, no acute distress, nontoxic appearing HEENT: [place], AT Cardiovascular: No clubbing, cyanosis, or edema Respiratory: Normal respiratory effort, no increased work of breathing Skin: No rashes, bruises or suspicious lesions Neurologic: Grossly intact, no focal deficits, moving all 4 extremities Psychiatric: Normal mood and affect  Laboratory Data: Results for orders placed or performed in visit on 11/22/23  Bladder Scan (Post Void Residual) in office   Collection Time: 11/22/23  2:06 PM  Result Value Ref Range   Scan Result 16ml    Assessment & Plan:   1. Weak urinary stream (Primary) PVR again WNL today, though as previously I think he is at high risk for inaccurate bladder scans due to PD.  With ongoing, intermittent difficulty voiding and weak stream, we discussed options including Foley catheter versus I&O catheterization as needed until we can get him in for cystoscopy in several weeks.  His wife is a retired Insurance underwriter and very comfortable with performing I&O catheterization.  They have decided to move forward with this option, which I feel is reasonable.  I gave them catheter supplies to take home Physicist, medical standard 16 coud) and they will let me know how often they are needing them so we can make an appropriate order.  They know they can call me if he needs a Foley placed around the time of his upcoming angiography. - Bladder Scan (Post Void Residual) in office  Return for Keep follow-up as scheduled.  Lucie Hones, PA-C  [facility] [address], [address] [place], [place] [address] [phone]

## 2023-11-25 ENCOUNTER — Ambulatory Visit
Admission: RE | Admit: 2023-11-25 | Discharge: 2023-11-25 | Disposition: A | Discharge status: Home / Self Care | Attending: Vascular Surgery | Admitting: Vascular Surgery

## 2023-11-25 DIAGNOSIS — L97909 Non-pressure chronic ulcer of unspecified part of unspecified lower leg with unspecified severity: Secondary | ICD-10-CM

## 2023-11-25 NOTE — Interval H&P Note (Signed)
 History and Physical Interval Note:  11/25/2023 1:50 PM  Tyler Wiley  has presented today for surgery, with the diagnosis of R Arm Angiogram    ASO w ulceration.  The various methods of treatment have been discussed with the patient and family. After consideration of risks, benefits and other options for treatment, the patient has consented to  Procedure(s): Upper Extremity Angiography (Right) as a surgical intervention.  The patient's history has been reviewed, patient examined, no change in status, stable for surgery.  I have reviewed the patient's chart and labs.  Questions were answered to the patient's satisfaction.     Gwynn Chalker

## 2023-11-26 ENCOUNTER — Telehealth (INDEPENDENT_AMBULATORY_CARE_PROVIDER_SITE_OTHER): Payer: Self-pay

## 2023-11-26 NOTE — Telephone Encounter (Signed)
 The patient and his home health nurse has called about the patient's procedure which was rescheduled by [facility] on 11/25/23 to 0/08/28/25 with Dr. Marea. The patient was apparently given instructions before he left the [facility] and got home and was unclear about his time and of arrival and medications to discontinue as well. Patient was informed after I spoke with [facility] to get a arrival time and it was explained to the patient and nurse that his pre-procedure instructions are the same as before.

## 2023-12-02 ENCOUNTER — Ambulatory Visit
Admission: RE | Admit: 2023-12-02 | Discharge: 2023-12-02 | Disposition: A | Discharge status: Home / Self Care | Source: Ambulatory Visit | Attending: Vascular Surgery | Admitting: Vascular Surgery

## 2023-12-02 ENCOUNTER — Other Ambulatory Visit: Payer: Self-pay

## 2023-12-02 ENCOUNTER — Ambulatory Visit: Admit: 2023-12-02 | Admitting: Vascular Surgery

## 2023-12-02 ENCOUNTER — Encounter
Admission: RE | Disposition: A | Discharge status: Home / Self Care | Payer: Self-pay | Source: Ambulatory Visit | Attending: Vascular Surgery

## 2023-12-02 ENCOUNTER — Encounter: Payer: Self-pay | Admitting: Vascular Surgery

## 2023-12-02 DIAGNOSIS — L97519 Non-pressure chronic ulcer of other part of right foot with unspecified severity: Secondary | ICD-10-CM | POA: Diagnosis not present

## 2023-12-02 DIAGNOSIS — Z7901 Long term (current) use of anticoagulants: Secondary | ICD-10-CM | POA: Diagnosis not present

## 2023-12-02 DIAGNOSIS — N186 End stage renal disease: Secondary | ICD-10-CM

## 2023-12-02 DIAGNOSIS — E1151 Type 2 diabetes mellitus with diabetic peripheral angiopathy without gangrene: Secondary | ICD-10-CM | POA: Insufficient documentation

## 2023-12-02 DIAGNOSIS — I7025 Atherosclerosis of native arteries of other extremities with ulceration: Secondary | ICD-10-CM

## 2023-12-02 DIAGNOSIS — L98499 Non-pressure chronic ulcer of skin of other sites with unspecified severity: Secondary | ICD-10-CM

## 2023-12-02 DIAGNOSIS — L97909 Non-pressure chronic ulcer of unspecified part of unspecified lower leg with unspecified severity: Secondary | ICD-10-CM

## 2023-12-02 DIAGNOSIS — E11621 Type 2 diabetes mellitus with foot ulcer: Secondary | ICD-10-CM | POA: Insufficient documentation

## 2023-12-02 DIAGNOSIS — Z87891 Personal history of nicotine dependence: Secondary | ICD-10-CM | POA: Insufficient documentation

## 2023-12-02 DIAGNOSIS — Z992 Dependence on renal dialysis: Secondary | ICD-10-CM | POA: Diagnosis not present

## 2023-12-02 DIAGNOSIS — I480 Paroxysmal atrial fibrillation: Secondary | ICD-10-CM | POA: Diagnosis not present

## 2023-12-02 DIAGNOSIS — Z7984 Long term (current) use of oral hypoglycemic drugs: Secondary | ICD-10-CM | POA: Diagnosis not present

## 2023-12-02 DIAGNOSIS — I120 Hypertensive chronic kidney disease with stage 5 chronic kidney disease or end stage renal disease: Secondary | ICD-10-CM | POA: Diagnosis not present

## 2023-12-02 DIAGNOSIS — Z79899 Other long term (current) drug therapy: Secondary | ICD-10-CM | POA: Diagnosis not present

## 2023-12-02 DIAGNOSIS — E1122 Type 2 diabetes mellitus with diabetic chronic kidney disease: Secondary | ICD-10-CM | POA: Insufficient documentation

## 2023-12-02 HISTORY — PX: UPPER EXTREMITY ANGIOGRAPHY: CATH118270

## 2023-12-02 LAB — GLUCOSE, CAPILLARY
Glucose-Capillary: 53 mg/dL — ABNORMAL LOW (ref 70–99)
Glucose-Capillary: 102 mg/dL — ABNORMAL HIGH (ref 70–99)

## 2023-12-02 LAB — POTASSIUM (ARMC VASCULAR LAB ONLY): Potassium (ARMC vascular lab): 4.2 mmol/L (ref 3.5–5.1)

## 2023-12-02 SURGERY — UPPER EXTREMITY ANGIOGRAPHY
Anesthesia: Moderate Sedation

## 2023-12-02 SURGERY — UPPER EXTREMITY ANGIOGRAPHY
Anesthesia: Moderate Sedation | Laterality: Right

## 2023-12-02 MED ORDER — MIDAZOLAM HCL 2 MG/ML PO SYRP: 8.0000 mg | ORAL_SOLUTION | Freq: Once | ORAL | Status: DC | PRN

## 2023-12-02 MED ORDER — HYDROMORPHONE HCL 1 MG/ML IJ SOLN: 1.0000 mg | Freq: Once | INTRAMUSCULAR | Status: DC | PRN

## 2023-12-02 MED ORDER — HEPARIN SODIUM (PORCINE) 1000 UNIT/ML IJ SOLN
INTRAMUSCULAR | Status: DC | PRN
  Administered 2023-12-02: 3000 [IU] via INTRAVENOUS

## 2023-12-02 MED ORDER — HEPARIN SODIUM (PORCINE) 1000 UNIT/ML IJ SOLN
INTRAMUSCULAR | Status: AC
  Filled 2023-12-02: qty 10

## 2023-12-02 MED ORDER — IODIXANOL 320 MG/ML IV SOLN
INTRAVENOUS | Status: DC | PRN
  Administered 2023-12-02: 50 mL

## 2023-12-02 MED ORDER — FENTANYL CITRATE (PF) 100 MCG/2ML IJ SOLN
INTRAMUSCULAR | Status: DC | PRN
  Administered 2023-12-02: 25 ug via INTRAVENOUS
  Administered 2023-12-02: 50 ug via INTRAVENOUS

## 2023-12-02 MED ORDER — DIPHENHYDRAMINE HCL 50 MG/ML IJ SOLN
INTRAMUSCULAR | Status: AC
  Filled 2023-12-02: qty 1

## 2023-12-02 MED ORDER — FAMOTIDINE 20 MG PO TABS
ORAL_TABLET | ORAL | Status: AC
  Filled 2023-12-02: qty 2

## 2023-12-02 MED ORDER — MIDAZOLAM HCL 2 MG/2ML IJ SOLN
INTRAMUSCULAR | Status: DC | PRN
  Administered 2023-12-02 (×2): 1 mg via INTRAVENOUS

## 2023-12-02 MED ORDER — FENTANYL CITRATE PF 50 MCG/ML IJ SOSY
PREFILLED_SYRINGE | INTRAMUSCULAR | Status: AC
  Filled 2023-12-02: qty 1

## 2023-12-02 MED ORDER — DIPHENHYDRAMINE HCL 50 MG/ML IJ SOLN
50.0000 mg | Freq: Once | INTRAMUSCULAR | Status: AC | PRN
  Administered 2023-12-02: 50 mg via INTRAVENOUS

## 2023-12-02 MED ORDER — DEXTROSE 50 % IV SOLN
INTRAVENOUS | Status: AC
  Filled 2023-12-02: qty 50

## 2023-12-02 MED ORDER — METHYLPREDNISOLONE SODIUM SUCC 125 MG IJ SOLR
INTRAMUSCULAR | Status: AC
  Filled 2023-12-02: qty 2

## 2023-12-02 MED ORDER — SODIUM CHLORIDE 0.9 % IV SOLN: INTRAVENOUS | Status: DC

## 2023-12-02 MED ORDER — METHYLPREDNISOLONE SODIUM SUCC 125 MG IJ SOLR
125.0000 mg | Freq: Once | INTRAMUSCULAR | Status: AC | PRN
  Administered 2023-12-02: 125 mg via INTRAVENOUS

## 2023-12-02 MED ORDER — HEPARIN (PORCINE) IN NACL 1000-0.9 UT/500ML-% IV SOLN
INTRAVENOUS | Status: DC | PRN
  Administered 2023-12-02: 1000 mL

## 2023-12-02 MED ORDER — LIDOCAINE-EPINEPHRINE (PF) 1 %-1:200000 IJ SOLN
INTRAMUSCULAR | Status: DC | PRN
  Administered 2023-12-02: 10 mL

## 2023-12-02 MED ORDER — ONDANSETRON HCL 4 MG/2ML IJ SOLN: 4.0000 mg | Freq: Four times a day (QID) | INTRAMUSCULAR | Status: DC | PRN

## 2023-12-02 MED ORDER — DEXTROSE 50 % IV SOLN
25.0000 mL | Freq: Once | INTRAVENOUS | Status: AC
  Administered 2023-12-02: 25 mL via INTRAVENOUS

## 2023-12-02 MED ORDER — FAMOTIDINE 20 MG PO TABS: 40.0000 mg | ORAL_TABLET | Freq: Once | ORAL | Status: DC | PRN

## 2023-12-02 MED ORDER — VANCOMYCIN HCL 1000 MG/200ML IV SOLN
1000.0000 mg | INTRAVENOUS | Status: AC
  Administered 2023-12-02: 1000 mg via INTRAVENOUS
  Filled 2023-12-02: qty 200

## 2023-12-02 MED ORDER — MIDAZOLAM HCL 2 MG/2ML IJ SOLN
INTRAMUSCULAR | Status: AC
  Filled 2023-12-02: qty 2

## 2023-12-02 SURGICAL SUPPLY — 12 items
CATH ANGIO 5F PIGTAIL 100CM (CATHETERS) IMPLANT
CATH BEACON 5 .035 100 H1 TIP (CATHETERS) IMPLANT
CATH CXI SUPP ANG 4FR 135 (CATHETERS) IMPLANT
COVER PROBE ULTRASOUND 5X96 (MISCELLANEOUS) IMPLANT
DEVICE STARCLOSE SE CLOSURE (Vascular Products) IMPLANT
DEVICE TORQUE (MISCELLANEOUS) IMPLANT
GLIDEWIRE ANGLED SS 035X260CM (WIRE) IMPLANT
PACK ANGIOGRAPHY (CUSTOM PROCEDURE TRAY) IMPLANT
SHEATH BRITE TIP 5FRX11 (SHEATH) IMPLANT
SYR MEDRAD MARK 7 150ML (SYRINGE) IMPLANT
TUBING CONTRAST HIGH PRESS 72 (TUBING) IMPLANT
WIRE J 3MM .035X145CM (WIRE) IMPLANT

## 2023-12-02 NOTE — Interval H&P Note (Signed)
 History and Physical Interval Note:  12/02/2023 8:17 AM  Tyler Wiley  has presented today for surgery, with the diagnosis of ulcer.  The various methods of treatment have been discussed with the patient and family. After consideration of risks, benefits and other options for treatment, the patient has consented to  Procedure(s): Upper Extremity Angiography (N/A) as a surgical intervention.  The patient's history has been reviewed, patient examined, no change in status, stable for surgery.  I have reviewed the patient's chart and labs.  Questions were answered to the patient's satisfaction.     Allure Greaser

## 2023-12-02 NOTE — Op Note (Signed)
 OPERATIVE REPORT     PREOPERATIVE DIAGNOSIS: 1.  Ischemic ulceration right hand 2.  End-stage renal disease   POSTOPERATIVE DIAGNOSIS: Same as above   PROCEDURE PERFORMED: 1. Ultrasound guidance vascular access to right femoral artery. 2. Catheter placement to right radial artery and right ulnar arteries     from right femoral approach. 3. Thoracic aortogram and selective right upper extremity angiogram     including selective images of the radial and ulnar arteries. 4. StarClose closure device right femoral artery.   SURGEON:  Selinda GORMAN Gu, MD   ANESTHESIA:  Local with moderate conscious sedation for 31 minutes using 2 mg of Versed  and 75 mcg of Fentanyl    BLOOD LOSS:  Minimal.   FLUOROSCOPY TIME:  4.9 minutes   INDICATION FOR PROCEDURE:  This is a 78 y.o.male who presented to our office with an ischemic ulceration on the right hand.  Noninvasive studies suggested small vessel disease in the right hand.  To further evaluate this to determine what options would be possible to treat the malperfusion, angiogram of the left upper extremity is indicated.  Risks and benefits are discussed.  Informed consent was obtained.   DESCRIPTION OF PROCEDURE:  The patient was brought to the vascular suite.  Moderate conscious sedation was administered during a face to face encounter with the patient throughout the procedure with my supervision of the RN administering medicines and monitoring the patient's vital signs, pulse oximetry, telemetry and mental status throughout from the start of the procedure until the patient was taken to the recovery room.  Groins were shaved and prepped and sterile surgical field was created.  The right femoral head was localized with fluoroscopy and the right femoral artery was then visualized with ultrasound and found to be widely patent.  It was then accessed under direct ultrasound guidance without difficulty with a Seldinger needle and a permanent image was  recorded.  A J-wire and 5-French sheath were then placed.  Pigtail catheter was placed into the ascending aorta and a thoracic aortogram was then performed in the LAO projection. This demonstrated normal origins to the great vessels without significant proximal stenoses and a normal configuration of the great vessels.  The patient was given 3000 units of intravenous heparin  and a H1 catheter was used to selectively cannulate the innominate artery and then the right subclavian artery without difficulty.  This was then sequentially advanced to the brachial artery and to the brachial bifurcation.  There were no significant lesions within the innominate artery, right subclavian artery, right axillary artery, or right brachial artery.  I then exchanged for a CXI catheter and first cannulated the right ulnar artery.  This was a fairly large vessel and I advanced to the proximal to midportion of the ulnar artery.  Imaging demonstrated no significant stenosis within the proximal or mid ulnar artery although the vessel was highly calcific.  At the wrist, there was a high-grade stenosis of the ulnar artery and then there was very poor filling of the hand and what appeared to be an incomplete palmar arch.  Essentially no digital flow was seen with an ulnar injection.  I then pulled back and cannulated the right radial artery without difficulty with a CXI catheter and the Glidewire.  Selective imaging was then performed.  The radial artery itself did not demonstrate any significant stenosis throughout its course in the arm but as it went into the hand there was a moderate stenosis leading into the palmar artery and arch.  Again this was incomplete.  There was very sluggish filling of the digital vessels with severe disease in the small vessels in the hand.  There was no obvious filling of the second digital vessels.  Patient's motion made our imaging somewhat limited, but overall opacification was fairly good.  I did not  feel there was much I could do from an endovascular standpoint and will refer the patient to a hand surgeon. The diagnostic catheter was removed.  Oblique arteriogram was performed of the right femoral artery and StarClose closure device deployed in the usual fashion with excellent hemostatic result. The patient tolerated the procedure well and was taken to the recovery room in stable condition.    Selinda Gu 12/02/2023 10:29 AM

## 2023-12-20 ENCOUNTER — Telehealth: Payer: Self-pay | Admitting: Physician Assistant

## 2023-12-20 NOTE — Telephone Encounter (Signed)
 Pt would like for you to give him a call at your earliest convenience.  He said you have helped him so much in the past and he wants to let you know what's going on with him.  He's been in hospital at [facility] for 3 day and have to have a finger removed due to circulation issues.

## 2023-12-21 ENCOUNTER — Other Ambulatory Visit: Admitting: Urology

## 2023-12-24 NOTE — Telephone Encounter (Signed)
 Call returned. Keep plans for cysto next month.

## 2024-01-10 ENCOUNTER — Other Ambulatory Visit: Payer: Self-pay | Admitting: Dermatology

## 2024-01-24 ENCOUNTER — Other Ambulatory Visit: Payer: Self-pay | Admitting: Dermatology

## 2024-01-25 ENCOUNTER — Other Ambulatory Visit: Admitting: Urology

## 2024-01-27 ENCOUNTER — Other Ambulatory Visit: Payer: Self-pay | Admitting: Dermatology

## 2024-02-15 ENCOUNTER — Other Ambulatory Visit: Admitting: Urology

## 2024-02-18 ENCOUNTER — Inpatient Hospital Stay

## 2024-02-18 ENCOUNTER — Other Ambulatory Visit: Payer: Self-pay

## 2024-02-18 ENCOUNTER — Inpatient Hospital Stay
Admission: EM | Admit: 2024-02-18 | Discharge: 2024-02-22 | DRG: 239 | Disposition: A | Discharge status: Home Health | Attending: Internal Medicine | Admitting: Internal Medicine

## 2024-02-18 ENCOUNTER — Emergency Department

## 2024-02-18 DIAGNOSIS — Z91013 Allergy to seafood: Secondary | ICD-10-CM

## 2024-02-18 DIAGNOSIS — N186 End stage renal disease: Secondary | ICD-10-CM | POA: Diagnosis present

## 2024-02-18 DIAGNOSIS — D631 Anemia in chronic kidney disease: Secondary | ICD-10-CM | POA: Diagnosis present

## 2024-02-18 DIAGNOSIS — Z794 Long term (current) use of insulin: Secondary | ICD-10-CM | POA: Diagnosis not present

## 2024-02-18 DIAGNOSIS — A480 Gas gangrene: Secondary | ICD-10-CM | POA: Diagnosis present

## 2024-02-18 DIAGNOSIS — E872 Acidosis, unspecified: Secondary | ICD-10-CM | POA: Diagnosis present

## 2024-02-18 DIAGNOSIS — Z7984 Long term (current) use of oral hypoglycemic drugs: Secondary | ICD-10-CM | POA: Diagnosis not present

## 2024-02-18 DIAGNOSIS — Z992 Dependence on renal dialysis: Secondary | ICD-10-CM

## 2024-02-18 DIAGNOSIS — E1169 Type 2 diabetes mellitus with other specified complication: Secondary | ICD-10-CM | POA: Diagnosis present

## 2024-02-18 DIAGNOSIS — I739 Peripheral vascular disease, unspecified: Secondary | ICD-10-CM | POA: Diagnosis present

## 2024-02-18 DIAGNOSIS — K219 Gastro-esophageal reflux disease without esophagitis: Secondary | ICD-10-CM | POA: Diagnosis present

## 2024-02-18 DIAGNOSIS — M86171 Other acute osteomyelitis, right ankle and foot: Secondary | ICD-10-CM | POA: Diagnosis present

## 2024-02-18 DIAGNOSIS — Z888 Allergy status to other drugs, medicaments and biological substances status: Secondary | ICD-10-CM

## 2024-02-18 DIAGNOSIS — L03115 Cellulitis of right lower limb: Secondary | ICD-10-CM | POA: Diagnosis present

## 2024-02-18 DIAGNOSIS — N400 Enlarged prostate without lower urinary tract symptoms: Secondary | ICD-10-CM | POA: Diagnosis present

## 2024-02-18 DIAGNOSIS — I2510 Atherosclerotic heart disease of native coronary artery without angina pectoris: Secondary | ICD-10-CM | POA: Diagnosis present

## 2024-02-18 DIAGNOSIS — E871 Hypo-osmolality and hyponatremia: Secondary | ICD-10-CM | POA: Diagnosis present

## 2024-02-18 DIAGNOSIS — E785 Hyperlipidemia, unspecified: Secondary | ICD-10-CM | POA: Diagnosis present

## 2024-02-18 DIAGNOSIS — Z8673 Personal history of transient ischemic attack (TIA), and cerebral infarction without residual deficits: Secondary | ICD-10-CM

## 2024-02-18 DIAGNOSIS — Z7901 Long term (current) use of anticoagulants: Secondary | ICD-10-CM | POA: Diagnosis not present

## 2024-02-18 DIAGNOSIS — E1122 Type 2 diabetes mellitus with diabetic chronic kidney disease: Secondary | ICD-10-CM | POA: Diagnosis present

## 2024-02-18 DIAGNOSIS — Z8249 Family history of ischemic heart disease and other diseases of the circulatory system: Secondary | ICD-10-CM

## 2024-02-18 DIAGNOSIS — I480 Paroxysmal atrial fibrillation: Secondary | ICD-10-CM | POA: Diagnosis present

## 2024-02-18 DIAGNOSIS — E1140 Type 2 diabetes mellitus with diabetic neuropathy, unspecified: Secondary | ICD-10-CM | POA: Diagnosis present

## 2024-02-18 DIAGNOSIS — N2581 Secondary hyperparathyroidism of renal origin: Secondary | ICD-10-CM | POA: Diagnosis present

## 2024-02-18 DIAGNOSIS — I7025 Atherosclerosis of native arteries of other extremities with ulceration: Secondary | ICD-10-CM | POA: Diagnosis present

## 2024-02-18 DIAGNOSIS — Z85828 Personal history of other malignant neoplasm of skin: Secondary | ICD-10-CM

## 2024-02-18 DIAGNOSIS — Z79899 Other long term (current) drug therapy: Secondary | ICD-10-CM

## 2024-02-18 DIAGNOSIS — Z89021 Acquired absence of right finger(s): Secondary | ICD-10-CM

## 2024-02-18 DIAGNOSIS — I120 Hypertensive chronic kidney disease with stage 5 chronic kidney disease or end stage renal disease: Secondary | ICD-10-CM | POA: Diagnosis present

## 2024-02-18 DIAGNOSIS — Z87891 Personal history of nicotine dependence: Secondary | ICD-10-CM

## 2024-02-18 DIAGNOSIS — E11628 Type 2 diabetes mellitus with other skin complications: Secondary | ICD-10-CM | POA: Diagnosis present

## 2024-02-18 DIAGNOSIS — E1152 Type 2 diabetes mellitus with diabetic peripheral angiopathy with gangrene: Secondary | ICD-10-CM | POA: Diagnosis present

## 2024-02-18 DIAGNOSIS — D696 Thrombocytopenia, unspecified: Secondary | ICD-10-CM | POA: Diagnosis present

## 2024-02-18 DIAGNOSIS — Z9049 Acquired absence of other specified parts of digestive tract: Secondary | ICD-10-CM

## 2024-02-18 HISTORY — DX: Unspecified atrial fibrillation: I48.91

## 2024-02-18 HISTORY — DX: Polyneuropathy, unspecified: G62.9

## 2024-02-18 LAB — MAGNESIUM: Magnesium: 3.4 mg/dL — ABNORMAL HIGH (ref 1.7–2.4)

## 2024-02-18 LAB — COMPREHENSIVE METABOLIC PANEL WITH GFR
ALT: 21 U/L (ref 0–44)
AST: 25 U/L (ref 15–41)
Albumin: 2.9 g/dL — ABNORMAL LOW (ref 3.5–5.0)
Alkaline Phosphatase: 86 U/L (ref 38–126)
Anion gap: 19 — ABNORMAL HIGH (ref 5–15)
BUN: 48 mg/dL — ABNORMAL HIGH (ref 8–23)
CO2: 22 mmol/L (ref 22–32)
Calcium: 9.7 mg/dL (ref 8.9–10.3)
Chloride: 90 mmol/L — ABNORMAL LOW (ref 98–111)
Creatinine, Ser: 10.1 mg/dL — ABNORMAL HIGH (ref 0.61–1.24)
GFR, Estimated: 5 mL/min — ABNORMAL LOW (ref >60)
Glucose, Bld: 220 mg/dL — ABNORMAL HIGH (ref 70–99)
Potassium: 4.9 mmol/L (ref 3.5–5.1)
Sodium: 131 mmol/L — ABNORMAL LOW (ref 135–145)
Total Bilirubin: 0.3 mg/dL (ref 0.0–1.2)
Total Protein: 6 g/dL — ABNORMAL LOW (ref 6.5–8.1)

## 2024-02-18 LAB — PROTIME-INR
INR: 1.3 — ABNORMAL HIGH (ref 0.8–1.2)
Prothrombin Time: 17.2 s — ABNORMAL HIGH (ref 11.4–15.2)

## 2024-02-18 LAB — APTT: aPTT: 41 s — ABNORMAL HIGH (ref 24–36)

## 2024-02-18 LAB — C-REACTIVE PROTEIN: CRP: 16 mg/dL — ABNORMAL HIGH (ref <1.0)

## 2024-02-18 LAB — SEDIMENTATION RATE: Sed Rate: 85 mm/h — ABNORMAL HIGH (ref 0–20)

## 2024-02-18 LAB — GLUCOSE, CAPILLARY: Glucose-Capillary: 146 mg/dL — ABNORMAL HIGH (ref 70–99)

## 2024-02-18 LAB — CBC WITH DIFFERENTIAL/PLATELET
Abs Immature Granulocytes: 0.2 K/uL — ABNORMAL HIGH (ref 0.00–0.07)
Basophils Absolute: 0.1 K/uL (ref 0.0–0.1)
Basophils Relative: 0 %
Eosinophils Absolute: 0.5 K/uL (ref 0.0–0.5)
Eosinophils Relative: 3 %
HCT: 31.4 % — ABNORMAL LOW (ref 39.0–52.0)
Hemoglobin: 10.4 g/dL — ABNORMAL LOW (ref 13.0–17.0)
Immature Granulocytes: 1 %
Lymphocytes Relative: 11 %
Lymphs Abs: 1.6 K/uL (ref 0.7–4.0)
MCH: 31.1 pg (ref 26.0–34.0)
MCHC: 33.1 g/dL (ref 30.0–36.0)
MCV: 94 fL (ref 80.0–100.0)
Monocytes Absolute: 1.5 K/uL — ABNORMAL HIGH (ref 0.1–1.0)
Monocytes Relative: 10 %
Neutro Abs: 11 K/uL — ABNORMAL HIGH (ref 1.7–7.7)
Neutrophils Relative %: 75 %
Platelets: 154 K/uL (ref 150–400)
RBC: 3.34 MIL/uL — ABNORMAL LOW (ref 4.22–5.81)
RDW: 14.4 % (ref 11.5–15.5)
WBC: 14.8 K/uL — ABNORMAL HIGH (ref 4.0–10.5)
nRBC: 0 % (ref 0.0–0.2)

## 2024-02-18 LAB — HEPARIN LEVEL (UNFRACTIONATED): Heparin Unfractionated: >1.1 [IU]/mL — ABNORMAL HIGH (ref 0.30–0.70)

## 2024-02-18 LAB — LACTIC ACID, PLASMA
Lactic Acid, Venous: 4.6 mmol/L (ref 0.5–1.9)
Lactic Acid, Venous: 2.1 mmol/L (ref 0.5–1.9)

## 2024-02-18 MED ORDER — PIPERACILLIN-TAZOBACTAM IN DEX 2-0.25 GM/50ML IV SOLN
2.2500 g | Freq: Three times a day (TID) | INTRAVENOUS | Status: DC
  Administered 2024-02-19 – 2024-02-22 (×10): 2.25 g via INTRAVENOUS
  Filled 2024-02-18 (×13): qty 50

## 2024-02-18 MED ORDER — HEPARIN (PORCINE) 25000 UT/250ML-% IV SOLN: 1200.0000 [IU]/h | INTRAVENOUS | Status: DC

## 2024-02-18 MED ORDER — SENNOSIDES-DOCUSATE SODIUM 8.6-50 MG PO TABS
1.0000 | ORAL_TABLET | Freq: Every evening | ORAL | Status: DC | PRN
  Filled 2024-02-18: qty 1

## 2024-02-18 MED ORDER — HEPARIN (PORCINE) 25000 UT/250ML-% IV SOLN
1200.0000 [IU]/h | INTRAVENOUS | Status: AC
  Administered 2024-02-18: 1200 [IU]/h via INTRAVENOUS
  Filled 2024-02-18: qty 250

## 2024-02-18 MED ORDER — LACTATED RINGERS IV SOLN: INTRAVENOUS | Status: AC

## 2024-02-18 MED ORDER — HEPARIN SODIUM (PORCINE) 5000 UNIT/ML IJ SOLN: 5000.0000 [IU] | Freq: Three times a day (TID) | INTRAMUSCULAR | Status: DC

## 2024-02-18 MED ORDER — PANTOPRAZOLE SODIUM 40 MG PO TBEC
40.0000 mg | DELAYED_RELEASE_TABLET | Freq: Every day | ORAL | Status: DC
  Administered 2024-02-19 – 2024-02-22 (×3): 40 mg via ORAL
  Filled 2024-02-18 (×3): qty 1

## 2024-02-18 MED ORDER — SODIUM CHLORIDE 0.9 % IV BOLUS
500.0000 mL | Freq: Once | INTRAVENOUS | Status: AC
  Administered 2024-02-18: 500 mL via INTRAVENOUS

## 2024-02-18 MED ORDER — VANCOMYCIN VARIABLE DOSE PER UNSTABLE RENAL FUNCTION (PHARMACIST DOSING): Status: DC

## 2024-02-18 MED ORDER — HEPARIN BOLUS VIA INFUSION
4200.0000 [IU] | Freq: Once | INTRAVENOUS | Status: DC
  Filled 2024-02-18: qty 4200

## 2024-02-18 MED ORDER — SODIUM CHLORIDE 0.9% FLUSH
3.0000 mL | Freq: Two times a day (BID) | INTRAVENOUS | Status: DC
  Administered 2024-02-19 – 2024-02-21 (×6): 3 mL via INTRAVENOUS

## 2024-02-18 MED ORDER — ACETAMINOPHEN 325 MG PO TABS
650.0000 mg | ORAL_TABLET | Freq: Four times a day (QID) | ORAL | Status: DC | PRN
  Administered 2024-02-19 – 2024-02-21 (×2): 650 mg via ORAL
  Filled 2024-02-18 (×2): qty 2

## 2024-02-18 MED ORDER — ATORVASTATIN CALCIUM 20 MG PO TABS
40.0000 mg | ORAL_TABLET | Freq: Every day | ORAL | Status: DC
  Administered 2024-02-19 – 2024-02-21 (×3): 40 mg via ORAL
  Filled 2024-02-18 (×3): qty 2

## 2024-02-18 MED ORDER — ACETAMINOPHEN 650 MG RE SUPP: 650.0000 mg | Freq: Four times a day (QID) | RECTAL | Status: DC | PRN

## 2024-02-18 MED ORDER — VANCOMYCIN HCL 2000 MG/400ML IV SOLN
2000.0000 mg | Freq: Once | INTRAVENOUS | Status: AC
  Administered 2024-02-18: 2000 mg via INTRAVENOUS
  Filled 2024-02-18: qty 400

## 2024-02-18 MED ORDER — PIPERACILLIN-TAZOBACTAM 3.375 G IVPB 30 MIN
3.3750 g | Freq: Once | INTRAVENOUS | Status: AC
  Administered 2024-02-18: 3.375 g via INTRAVENOUS
  Filled 2024-02-18: qty 50

## 2024-02-18 NOTE — Consult Note (Signed)
 Pharmacy Consult Note - Anticoagulation  Pharmacy Consult for heparin  infusion Indication: atrial fibrillation Allergies  Allergen Reactions   Amlodipine  Swelling   Cefuroxime     Other: Mouth ulcers, tongue swollen.  Tolerated Ancef .   Shrimp Extract     Sometimes breaks out in hives    PATIENT MEASUREMENTS: Height: 6' (182.9 cm) Weight: 85.1 kg (187 lb 11.2 oz) IBW/kg (Calculated) : 77.6 HEPARIN  DW (KG): 85.1  VITAL SIGNS: Temp: 97.9 F (36.6 C) (11/14 1513) Temp Source: Oral (11/14 1513) BP: 103/71 (11/14 1730) Pulse Rate: 73 (11/14 1730)  Recent Labs    02/18/24 1525  HGB 10.4*  HCT 31.4*  PLT 154  CREATININE 10.10*    Estimated Creatinine Clearance: 6.6 mL/min (A) (by C-G formula based on SCr of 10.1 mg/dL (H)).  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 60 tablet 2 02/18/2024   atorvastatin  (LIPITOR) 40 MG tablet Take 1 tablet (40 mg total) by mouth daily at 6 PM. 30 tablet 0 02/18/2024   carvedilol  (COREG ) 6.25 MG tablet Take 6.25 mg by mouth 2 (two) times daily with a meal.   02/18/2024   fluticasone  (FLONASE ) 50 MCG/ACT nasal spray Place 2 sprays into both nostrils daily as needed for allergies. Home med.   Unknown   metoCLOPramide (REGLAN) 5 MG tablet Take 5 mg by mouth  every 8 (eight) hours as needed.   Unknown   ondansetron  (ZOFRAN -ODT) 8 MG disintegrating tablet Take 1-2 tablets (8-16 mg total) by mouth every 8 (eight) hours as needed for Nausea for up to 7 days   Unknown   oxyCODONE  (OXY IR/ROXICODONE ) 5 MG immediate release tablet Take 5-10 mg by mouth every 4 (four) hours as needed for severe pain (pain score 7-10).   Unknown   pantoprazole  (PROTONIX ) 40 MG tablet Take 40 mg by mouth daily.   02/18/2024   torsemide  (DEMADEX ) 10 MG tablet Take 10 mg by mouth 2 (two) times daily.   02/18/2024   clotrimazole-betamethasone (LOTRISONE) cream APPLY TO AFFECTED AREA TWICE A DAY (Patient not taking: Reported on 02/18/2024) 45 g 0 Not Taking   glimepiride  (AMARYL ) 2 MG tablet Take 2 mg by mouth daily with breakfast. (Patient not taking: Reported on 02/18/2024)   Not Taking   mupirocin  ointment (BACTROBAN ) 2 % Apply 1 Application topically daily. DAILY TO open sores on right hand, right foot and right great toe (Patient not taking: Reported on 02/18/2024) 22 g 0 Not Taking   Sulfamethoxazole -Trimethoprim  (SULFAMETHOXAZOLE -TMP DS PO)  (Patient not taking: Reported on 02/18/2024)   Not Taking   Scheduled:   sodium chloride  flush  3 mL Intravenous Q12H   vancomycin  variable dose per unstable renal function (pharmacist  dosing)   Does not apply See admin instructions   Infusions:   heparin      [START ON 02/19/2024] piperacillin-tazobactam (ZOSYN)  IV     PRN: acetaminophen  **OR** acetaminophen , senna-docusate  ASSESSMENT: 78 y.o. male with PMH s HTN, HLD, T2DM, ESRD on PD  is presenting with cellulitis on the right foot. Patient is on Eliquis   per chart review. Pharmacy has been consulted to initiate and manage heparin  intravenous infusion. Last Eliquis  dose: 1030 AM on 11/14  Goal(s) of therapy: Heparin  level 0.3 - 0.7 units/mL aPTT 66 - 102 seconds Monitor platelets by anticoagulation protocol: Yes   Baseline anticoagulation labs: Recent Labs     02/18/24 1525  HGB 10.4*  PLT 154    Baseline labs: apTT/INR/ HL pending   PLAN:   Will omit bolus dose given Eliquis  taken PTA, then start heparin  infusion at 1200 units/hour, when the next dose of Eliquis  is due.   Check aPTT in 8 hours.  Continue to titrate by aPTT until heparin  level and aPTT correlate then titrate by heparin  level alone.  Check heparin  level with next AM labs.  Continue to monitor CBC daily while on heparin  infusion.   Annabella LOISE Banks, PharmD Clinical Pharmacist 02/18/2024 8:13 PM

## 2024-02-18 NOTE — ED Provider Notes (Signed)
 [facility] Provider Note    Event Date/Time   First MD Initiated Contact with Patient 02/18/24 1553     (approximate)   History   Wound Check   HPI  Tyler Wiley is a 78 y.o. male who presents to the ED for evaluation of Wound Check   I review a podiatric clinic visit from 2 days ago.  Dialysis patient with diabetes.  No note documented for this visit.  Anticoagulated on Eliquis  due to pAF.  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   Much of history is provided by his wife who reports that he seems more confused over the past 2 days as the erythema on his right foot has been spreading rapidly.   Physical Exam   Triage Vital Signs: ED Triage Vitals  Encounter Vitals Group     BP 02/18/24 1513 (!) 74/52     Girls Systolic BP Percentile --      Girls Diastolic BP Percentile --      Boys Systolic BP Percentile --      Boys Diastolic BP Percentile --      Pulse Rate 02/18/24 1513 (!) 41     Resp 02/18/24 1513 20     Temp 02/18/24 1513 97.9 F (36.6 C)     Temp Source 02/18/24 1513 Oral     SpO2 02/18/24 1521 100 %     Weight 02/18/24 1521 187 lb 11.2 oz (85.1 kg)     Height 02/18/24 1521 6' (1.829 m)     Head Circumference --      Peak Flow --      Pain Score 02/18/24 1519 6     Pain Loc --      Pain Education --      Exclude from Growth Chart --     Most recent vital signs: Vitals:   02/18/24 1624 02/18/24 1730  BP: 124/75 103/71  Pulse: 80 73  Resp: 20 16  Temp:    SpO2: 98% 93%    General: Awake, no distress.  Pleasant, conversational, making jokes CV:  Good peripheral perfusion.  Resp:  Normal effort.  Abd:  No distention.  Soft and nontender MSK:  No deformity noted.  Neuro:  No focal deficits appreciated. Other:  Dry gangrene to multiple toes of bilateral feet as pictured below.  Streaking erythema, warmth and mild tenderness spreading  proximally from the right great toe as pictured below.    ED Results / Procedures / Treatments   Labs (all labs ordered are listed, but only abnormal results are displayed) Labs Reviewed  LACTIC ACID, PLASMA - Abnormal; Notable for the following components:      Result Value   Lactic Acid, Venous 4.6 (*)    All other components within normal limits  LACTIC ACID, PLASMA - Abnormal; Notable for the following components:   Lactic Acid, Venous 2.1 (*)    All other components within normal limits  COMPREHENSIVE METABOLIC PANEL WITH GFR - Abnormal; Notable for the following components:   Sodium 131 (*)    Chloride 90 (*)    Glucose, Bld 220 (*)    BUN 48 (*)    Creatinine, Ser 10.10 (*)    Total Protein 6.0 (*)    Albumin 2.9 (*)    GFR, Estimated 5 (*)    Anion gap 19 (*)    All other components within normal limits  CBC WITH DIFFERENTIAL/PLATELET - Abnormal; Notable for the following components:   WBC 14.8 (*)    RBC 3.34 (*)    Hemoglobin 10.4 (*)    HCT 31.4 (*)    Neutro Abs 11.0 (*)    Monocytes Absolute 1.5 (*)    Abs Immature Granulocytes 0.20 (*)    All other components within normal limits  MAGNESIUM  - Abnormal; Notable for the following components:   Magnesium  3.4 (*)    All other components within normal limits  SEDIMENTATION RATE - Abnormal; Notable for the following components:   Sed Rate 85 (*)    All other components within normal limits  CULTURE, BLOOD (ROUTINE X 2)  CULTURE, BLOOD (ROUTINE X 2)  URINALYSIS, W/ REFLEX TO CULTURE (INFECTION SUSPECTED)  BASIC METABOLIC PANEL WITH GFR  CBC  C-REACTIVE PROTEIN    EKG   RADIOLOGY Plain film of the right great toe interpreted by me without signs of osteomyelitis, fracture or dislocation  Official radiology report(s): DG Toe Great Right Result Date: 02/18/2024 EXAM: VIEW(S) XRAY OF THE RIGHT TOES 02/18/2024 05:35:17 PM COMPARISON: None available. CLINICAL HISTORY: eval osteo. dry gangrene chronically,  acute cellulitis streaking up foot FINDINGS: BONES AND JOINTS: Diffuse osteopenia. No radiographic findings of osteomyelitis, at this time. No acute fracture. No focal osseous lesion. No joint dislocation. SOFT TISSUES: Soft tissue irregularity along the distal tip of the great toe with subcutaneous gas. Extensive peripheral vascular atherosclerosis. IMPRESSION: 1. Soft tissue irregularity along the distal tip of the great toe with subcutaneous gas, worrisome for ulceration or gas-producing infection. No radiographic findings of osteomyelitis,. at this time. Electronically signed by: Rogelia Myers MD 02/18/2024 05:38 PM EST RP Workstation: [id number]    PROCEDURES and INTERVENTIONS:  .Critical Care  Performed by: Claudene Rover, MD Authorized by: Claudene Rover, MD   Critical care provider statement:    Critical care time (minutes):  30   Critical care time was exclusive of:  Separately billable procedures and treating other patients   Critical care was necessary to treat or prevent imminent or life-threatening deterioration of the following conditions:  Sepsis   Critical care was time spent personally by me on the following activities:  Development of treatment plan with patient or surrogate, discussions with consultants, evaluation of patient's response to treatment, examination of patient, ordering and review of laboratory studies, ordering and review of radiographic studies, ordering and performing treatments and interventions, pulse oximetry, re-evaluation of patient's condition and review of old charts .1-3 Lead EKG Interpretation  Performed by: Claudene Rover, MD Authorized by: Claudene Rover, MD     Interpretation: normal     ECG rate:  70   ECG rate assessment: normal     Rhythm: sinus rhythm     Ectopy: none     Conduction: normal     Medications  sodium chloride  flush (NS) 0.9 % injection 3 mL (has no administration in time range)  acetaminophen  (TYLENOL ) tablet 650 mg (has no  administration in time range)    Or  acetaminophen  (TYLENOL ) suppository 650 mg (has no administration in time range)  senna-docusate (Senokot-S) tablet 1 tablet (has no administration in time range)  heparin  injection 5,000 Units (has no administration in time range)  sodium chloride  0.9 % bolus 500 mL (0 mLs Intravenous Stopped 02/18/24 1727)  piperacillin-tazobactam (ZOSYN) IVPB 3.375 g (0 g Intravenous Stopped 02/18/24 1806)  vancomycin  (VANCOREADY) IVPB 2000 mg/400 mL (2,000 mg Intravenous New Bag/Given 02/18/24 1719)     IMPRESSION / MDM /  ASSESSMENT AND PLAN / ED COURSE  I reviewed the triage vital signs and the nursing notes.  Differential diagnosis includes, but is not limited to, sepsis, osteomyelitis, cellulitis, bacteremia  {Patient presents with symptoms of an acute illness or injury that is potentially life-threatening.  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  Soft blood pressures responsive to IV fluids.  Elevated lactic acid improving with resuscitation.  Leukocytosis is noted.  Stigmata of ESRD without emergent indications for dialysis.  No abdominal tenderness to indicate SBP.  Provide broad-spectrum antibiotics and consult medicine for admission.   Clinical Course as of 02/18/24 1920  Fri Feb 18, 2024  1735 I consult with medicine who agrees to admit [DS]  1744 I discussed with nephrology who will help facilitate PD [DS]    Clinical Course User Index [DS] Claudene Rover, MD     FINAL CLINICAL IMPRESSION(S) / ED DIAGNOSES   Final diagnoses:  Cellulitis of right foot     Rx / DC Orders   ED Discharge Orders     None        Note:  This document was prepared using Dragon voice recognition software and may include unintentional dictation errors.   Claudene Rover, MD 02/18/24 ARTEMUS

## 2024-02-18 NOTE — Consult Note (Signed)
 PODIATRIC SURGERY: CONSULT NOTE  Consulting Physician: Dr. Fernand  Reason for Consult: R foot infection / cellulitis   HPI: Tyler Wiley is a 78 y.o. male with PMH for with ESRD (on PD daily except Saturdays), prior CVA (2020), extensive PAD, HTN, BPH s/p urolift (2020), CAD, T2DM (Aic 7.5% 01/2024), HLD, s/p cholecystectomy, anemia of CKD, afib (on indefinite apixaban )  presenting to the ED for evaluation of wound/ infection concern to the bilateral foot - with right being acutely concerning.   Patient recently seen at [facility] facility this past in September for gangrenous changes affecting both his upper extremities (R 4th finger) and lower extremities. At that time the pain to his upper extremity was most urgent and partial digital amputation conducted 12/08/2023). He was DC and advised to follow up with ortho / vascular in outpatient setting.   He was seen by my colleague Dr. Lennie on 02/16/2024 and advised to present to the ED for acute intervention of gangrenous digits, as he was already demonstrating signs of infection. Patient relays his wife had just received (the day prior) dx of cx and that he was 'not in the right head space' for that reasoning and other external factors, to present to the ED as advised. Patient does endorse infection precautions were dispensed at that appointment but they chose against presenting to ED at that time.   [no ] Drainage.  fadime.flank ] Malodor. How long: 'couple of days' fadime.flank ] Redness. How long: 2 days fadime.flank ] Pain. How long: 3 days  Onset of wound: gradual - patient reports 2 month ago history of cleaning his toe nails with bristle brush, then over the next week noted blueish / green discoloration of the L toe - he believes this activity triggered the gangrenous change of events. Course: worsening  Aggravating factors: PAD DM2 ESRD Prior Wound Care instructions: Wife has been washing foot daily, then patting dry and painting with betadine.   Podiatry consulted  for evaluation of wound / infection BILATERAL FEET.   Denies: V/SOB/CP/calf pain. 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   L nose - LN2, 5FU/Calcipotriene cr   Stroke [facility])     Surgical Hx:  Past Surgical History:  Procedure Laterality Date   CHOLECYSTECTOMY     CYSTOSCOPY WITH INSERTION OF UROLIFT N/A 01/20/2019   Procedure: CYSTOSCOPY WITH INSERTION OF UROLIFT;  Surgeon: Sherrilee Belvie CROME, MD;  Location: [facility] ORS;  Service: Urology;  Laterality: N/A;  30 MINS   UPPER EXTREMITY ANGIOGRAPHY N/A 12/02/2023   Procedure: Upper Extremity Angiography;  Surgeon: Marea Selinda RAMAN, MD;  Location: [facility] INVASIVE CV LAB;  Service: Cardiovascular;  Laterality: N/A;    FHx:  Family History  Problem Relation Age of Onset   CAD Father     Social History:  reports that he has quit smoking. His smoking use included cigarettes. He has never used smokeless tobacco. He  reports current alcohol use. He reports that he does not use drugs.  Allergies:  Allergies  Allergen Reactions   Amlodipine  Swelling   Cefuroxime     Other: Mouth ulcers, tongue swollen.  Tolerated Ancef .   Shrimp Extract     Sometimes breaks out in hives    Medications Prior to Admission  Medication Sig Dispense Refill   apixaban  (ELIQUIS ) 5 MG TABS tablet Take 1 tablet (5 mg total) by mouth 2 (two) times daily. 60 tablet 2   atorvastatin  (LIPITOR) 40 MG tablet Take 1 tablet (40 mg total) by mouth daily at 6 PM. 30 tablet 0    carvedilol  (COREG ) 6.25 MG tablet Take 6.25 mg by mouth 2 (two) times daily with a meal.     fluticasone  (FLONASE ) 50 MCG/ACT nasal spray Place 2 sprays into both nostrils daily as needed for allergies. Home med.     metoCLOPramide (REGLAN) 5 MG tablet Take 5 mg by mouth every 8 (eight) hours as needed.     ondansetron  (ZOFRAN -ODT) 8 MG disintegrating tablet Take 1-2 tablets (8-16 mg total) by mouth every 8 (eight) hours as needed for Nausea for up to 7 days     oxyCODONE  (OXY IR/ROXICODONE ) 5 MG immediate release tablet Take 5-10 mg by mouth every 4 (four) hours as needed for severe pain (pain score 7-10).     pantoprazole  (PROTONIX ) 40 MG tablet Take 40 mg by mouth daily.     torsemide  (DEMADEX ) 10 MG tablet Take 10 mg by mouth 2 (two) times daily.     clotrimazole-betamethasone (LOTRISONE) cream APPLY TO AFFECTED AREA TWICE A DAY (Patient not taking: Reported on 02/18/2024) 45 g 0   glimepiride  (AMARYL ) 2 MG tablet Take 2 mg by mouth daily with breakfast. (Patient not taking: Reported on 02/18/2024)     mupirocin  ointment (BACTROBAN ) 2 % Apply 1 Application topically daily. DAILY TO open sores on right hand, right foot and right great toe (Patient not taking: Reported on 02/18/2024) 22 g 0   Sulfamethoxazole -Trimethoprim  (SULFAMETHOXAZOLE -TMP DS PO)  (Patient not taking: Reported on 02/18/2024)      Physical Exam: Blood pressure 134/72, pulse 68, temperature 98.6 F (37 C), resp. rate 16, height 6' (1.829 m), weight 85.1 kg, SpO2 93%.  Gen: NAD CV: Normal R on monitor Resp: Breathing on RA without tachypnea  Neuro: AO x 4   FOCUSED LOWER EXTREMITY EXAMINATION:   Neurological:  - Gross protective sensation diminished bilaterally.  - No focal motor or sensory deficits identified bilaterally.   Vascular:  - Dorsalis Pedis artery on palpation: nonpalpable LLE ; 1  on the RLE - Posterior Tibial artery on palpation: nonpalpable BLE - Capillary Filling Times: sluggish - Peripheral edema:  localized to the RLE - forefoot   Musculoskeletal:  Muscle strength: 5/5 in all 4 quadrants  Ankle Joint ROM: decreased, equinus noted Global foot - TTP: mild to the R forefoot / hallux. - no crepitus.   Dermatological:  - Skin quality: atrophic - gangrenous as pictured.  - erythema to the midfoot - stable from ED picture obtained around 5pm - R foot - distal necrosis to the hallux with plantar necrosis more extensively noted.  - less ascending erythema noted to the L foot - erythema associated primarily to the necrotic tissue (adjacent skin <1cm) - located to the dorsal distal hallux to level of HIPJ - distal 2nd digit both dorsal and plantar to level of HIPJ - stable eschar to the 2nd and 3rd MPJ level dorsally.  Results for orders placed or performed during the hospital encounter of 02/18/24 (from the past 48 hours)  Lactic acid, plasma     Status: Abnormal   Collection Time: 02/18/24  3:25 PM  Result Value Ref Range   Lactic Acid, Venous 4.6 (HH) 0.5 - 1.9 mmol/L    Comment: Critical Value, Read Back and verified with HASILEY SOEUN @1609  02/18/24 MJU Performed at [facility], [address] [place], [place] [address]   Comprehensive metabolic panel     Status: Abnormal   Collection Time: 02/18/24  3:25 PM  Result Value Ref Range   Sodium 131 (L) 135 - 145 mmol/L   Potassium 4.9 3.5 - 5.1 mmol/L   Chloride 90 (L) 98 - 111 mmol/L   CO2 22 22 - 32 mmol/L   Glucose, Bld 220 (H) 70 - 99 mg/dL    Comment: Glucose reference range applies only to samples taken after fasting for at least 8 hours.   BUN 48 (H) 8 - 23 mg/dL   Creatinine, Ser 89.89 (H) 0.61 - 1.24 mg/dL   Calcium  9.7 8.9 - 10.3 mg/dL   Total Protein 6.0 (L) 6.5 - 8.1 g/dL   Albumin 2.9 (L) 3.5 - 5.0 g/dL   AST 25 15 - 41 U/L    Comment: HEMOLYSIS AT THIS LEVEL MAY AFFECT RESULT   ALT 21 0 - 44 U/L   Alkaline Phosphatase 86 38 - 126 U/L   Total Bilirubin 0.3 0.0 - 1.2 mg/dL   GFR, Estimated  5 (L) >60 mL/min    Comment: (NOTE) Calculated using the CKD-EPI Creatinine Equation (2021)    Anion gap 19 (H) 5 - 15    Comment: Performed at [facility], [address] [place], [place] [address]  CBC with Differential     Status: Abnormal   Collection Time: 02/18/24  3:25 PM  Result Value Ref Range   WBC 14.8 (H) 4.0 - 10.5 K/uL   RBC 3.34 (L) 4.22 - 5.81 MIL/uL   Hemoglobin 10.4 (L) 13.0 - 17.0 g/dL   HCT 68.5 (L) 60.9 - 47.9 %   MCV 94.0 80.0 - 100.0 fL   MCH 31.1 26.0 - 34.0 pg   MCHC 33.1 30.0 - 36.0 g/dL   RDW 85.5 88.4 - 84.4 %   Platelets 154 150 - 400 K/uL   nRBC 0.0 0.0 - 0.2 %   Neutrophils Relative % 75 %   Neutro Abs 11.0 (H) 1.7 - 7.7 K/uL   Lymphocytes Relative 11 %   Lymphs Abs 1.6 0.7 - 4.0 K/uL   Monocytes Relative 10 %   Monocytes Absolute 1.5 (H) 0.1 - 1.0 K/uL   Eosinophils Relative 3 %   Eosinophils Absolute 0.5 0.0 - 0.5 K/uL   Basophils Relative 0 %   Basophils Absolute 0.1 0.0 - 0.1 K/uL   Immature Granulocytes 1 %   Abs Immature Granulocytes 0.20 (H) 0.00 - 0.07 K/uL    Comment: Performed at [facility], [address] [place], [place] [address]  Magnesium      Status: Abnormal   Collection Time: 02/18/24  3:25 PM  Result Value Ref Range   Magnesium  3.4 (H) 1.7 - 2.4 mg/dL    Comment: Performed at [facility], [address] [place], [place] [address]  Sedimentation rate     Status: Abnormal   Collection Time: 02/18/24  3:25 PM  Result Value Ref Range   Sed Rate 85 (H) 0 - 20 mm/hr  Comment: Performed at [facility], [address] [place], [place] [address]  Lactic acid, plasma     Status: Abnormal   Collection Time: 02/18/24  5:40 PM  Result Value Ref Range   Lactic Acid, Venous 2.1 (HH) 0.5 - 1.9 mmol/L    Comment: Critical value noted. Value is consistent with previously reported and called value ASW Performed at [facility], [address] [place], [place] [address]   APTT      Status: Abnormal   Collection Time: 02/18/24  8:46 PM  Result Value Ref Range   aPTT 41 (H) 24 - 36 seconds    Comment:        IF BASELINE aPTT IS ELEVATED, SUGGEST PATIENT RISK ASSESSMENT BE USED TO DETERMINE APPROPRIATE ANTICOAGULANT THERAPY. Performed at [facility], [address] [place], [place] [address]   Protime-INR     Status: Abnormal   Collection Time: 02/18/24  8:46 PM  Result Value Ref Range   Prothrombin Time 17.2 (H) 11.4 - 15.2 seconds   INR 1.3 (H) 0.8 - 1.2    Comment: (NOTE) INR goal varies based on device and disease states. Performed at [facility], [address] [place], [place] [address]   Heparin  level (unfractionated)     Status: Abnormal   Collection Time: 02/18/24  8:46 PM  Result Value Ref Range   Heparin  Unfractionated >1.10 (H) 0.30 - 0.70 IU/mL    Comment: (NOTE) The clinical reportable range upper limit is being lowered to >1.10 to align with the FDA approved guidance for the current laboratory assay.  If heparin  results are below expected values, and patient dosage has  been confirmed, suggest follow up testing of antithrombin III levels. Performed at [facility], [address] [place], [place] [address]   Glucose, capillary     Status: Abnormal   Collection Time: 02/18/24  9:21 PM  Result Value Ref Range   Glucose-Capillary 146 (H) 70 - 99 mg/dL    Comment: Glucose reference range applies only to samples taken after fasting for at least 8 hours.   Comment 1 Notify RN    DG Toe Great Right Result Date: 02/18/2024 EXAM: VIEW(S) XRAY OF THE RIGHT TOES 02/18/2024 05:35:17 PM COMPARISON: None available. CLINICAL HISTORY: eval osteo. dry gangrene chronically, acute cellulitis streaking up foot FINDINGS: BONES AND JOINTS: Diffuse osteopenia. No radiographic findings of osteomyelitis, at this time. No acute fracture. No focal osseous lesion. No joint dislocation. SOFT TISSUES: Soft tissue irregularity along  the distal tip of the great toe with subcutaneous gas. Extensive peripheral vascular atherosclerosis. IMPRESSION: 1. Soft tissue irregularity along the distal tip of the great toe with subcutaneous gas, worrisome for ulceration or gas-producing infection. No radiographic findings of osteomyelitis,. at this time. Electronically signed by: Rogelia Myers MD 02/18/2024 05:38 PM EST RP Workstation: [id number]     Assessment  Gangrene of the foot with critical limb ischemia due to peripheral vascular disease, bilaterally Diabetic foot infection, right foot   *Would like to have vascular input prior to surgical intervention if possible.  Discussed r/b/a with patient at bedside and consented for partial amputation versus total hallux amputation RIGHT foot with excision of all nonviable soft tissue and bone. Patient aware and consented. No guarantees were made. Surgical intervention warranted as limb salvage / infection control measures - patient aware that he is at high risk for limb loss, possible life loss. All questions were addressed to patient satisfaction.   - Case  request placed. - Consent order placed.   Plan  - ABX: Appreciate Medicine / ID / Pharmacy assist with antibiotic stewardship and appropriate coverage.  - Diet: NPO at midnight.  - Activity: NWB to the R  lower extremity.   - Wound Care: Daily paint with betadine.  Stony Stegmann KANDICE Blush 02/18/2024, 9:50 PM

## 2024-02-18 NOTE — H&P (Addendum)
 History and Physical    TIPTON BALLOW FMW:[id number] DOB: [birth year] DOA: 02/18/2024  DOS: the patient was seen and examined on 02/18/2024  PCP: Lenon Layman ORN, MD   Patient coming from: Home  I have personally briefly reviewed patient's old medical records in [facility] and CareEverywhere  HPI:   Tyler Wiley is a 78 y.o. year old male with medical history of HTN, HLD, T2DM, ESRD on PD presenting to ED with worsening right foot redness.    Pt states he developed redness of right foot over the last 1-2 days. No pain but he has poor sensation due to neuropathy. He has chronic gangrene for last 6 months and saw podiatry 2 days ago. He has followed up with vascular surgery and they have performed angiogram of upper extremity that showed poor distal circulation. Per his wife he has been told he has good pulses in his feet up to midfoot and poor perfusion after that.    On arrival to the ED patient was noted to be HDS stable. Lab work and imaging obtained. Lab work shows leukocytosis. Anemia at baseline. Blood cx ordered. CMP shows chronic ESRD changes with mild hyponatremia, decreased protein and decreased albumin. Magnesium  elevated at 3.4. Lactic acid elevated at 4.6. Right great toe xray obtained but pending. Given need for continued care, [facility] contacted for admission.  Review of Systems: As mentioned in the history of present illness. All other systems reviewed and are negative.   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   L nose - LN2, 5FU/Calcipotriene  cr   Stroke [facility])     Past Surgical History:  Procedure Laterality Date   CHOLECYSTECTOMY     CYSTOSCOPY WITH INSERTION OF UROLIFT N/A 01/20/2019   Procedure: CYSTOSCOPY WITH INSERTION OF UROLIFT;  Surgeon: Sherrilee Belvie CROME, MD;  Location: [facility];  Service: Urology;  Laterality: N/A;  30 MINS   UPPER EXTREMITY ANGIOGRAPHY N/A 12/02/2023   Procedure: Upper Extremity Angiography;  Surgeon: Marea Selinda RAMAN, MD;  Location: [facility] INVASIVE CV LAB;  Service: Cardiovascular;  Laterality: N/A;     Allergies  Allergen Reactions   Amlodipine  Swelling   Cefuroxime     Other: Mouth ulcers, tongue swollen.  Tolerated Ancef .   Shrimp Extract     Sometimes breaks out in hives    Family History  Problem Relation Age of Onset   CAD Father     Prior to Admission medications   Medication Sig Start Date End Date Taking? Authorizing Provider  apixaban  (ELIQUIS ) 5 MG TABS tablet Take 1 tablet (5 mg total) by mouth 2 (two) times daily. 01/09/23   Awanda City, MD  atorvastatin  (LIPITOR) 40 MG tablet Take 1 tablet (40 mg total) by mouth daily at 6 PM. 05/08/18   Mayo, Rockie Overly, MD  carvedilol  (COREG ) 6.25 MG tablet Take 6.25 mg by mouth. 09/15/23 09/14/24  [provider]  clotrimazole-betamethasone (LOTRISONE) cream APPLY TO AFFECTED AREA TWICE A DAY 06/07/23  McKenzie, Belvie CROME, MD  fluticasone  (FLONASE ) 50 MCG/ACT nasal spray Place 2 sprays into both nostrils daily as needed for allergies. Home med. 01/09/23   Awanda City, MD  glimepiride  (AMARYL ) 2 MG tablet Take 2 mg by mouth daily with breakfast.    [provider]  mupirocin  ointment (BACTROBAN ) 2 % Apply 1 Application topically daily. DAILY TO open sores on right hand, right foot and right great toe 01/27/24   Hester Alm BROCKS, MD  ondansetron  (ZOFRAN -ODT) 8 MG disintegrating tablet Take 1-2 tablets (8-16 mg total) by mouth every 8 (eight) hours as needed for Nausea for up to 7 days 06/15/22   [provider]  pantoprazole   (PROTONIX ) 40 MG tablet Take 40 mg by mouth daily.    [provider]  Sulfamethoxazole -Trimethoprim  (SULFAMETHOXAZOLE -TMP DS PO)  03/06/23   [provider]  torsemide  (DEMADEX ) 10 MG tablet Take 10 mg by mouth 2 (two) times daily.    [provider]    Social History:  reports that he has quit smoking. His smoking use included cigarettes. He has never used smokeless tobacco. He reports current alcohol use. He reports that he does not use drugs. Lives with wife Tobacco- Denies use. EtOH- Denies use.  Illicit drug use- denies use.  IADLs/ADLs- Needs help   Physical Exam: Vitals:   02/18/24 1521 02/18/24 1600 02/18/24 1624 02/18/24 1730  BP:  119/77 124/75 103/71  Pulse: 69  80 73  Resp:   20 16  Temp:      TempSrc:      SpO2: 100%  98% 93%  Weight: 85.1 kg     Height: 6' (1.829 m)        Gen: NAD HENT: NCAT CV: irregular rhythm with normal rate, pedal pulses nonpalpable but detected with doppler at bedside Resp: CTAB Abd: No TTP, PD site without signs of infection MSK: right hand with finger amputation, bilateral dry gangrene on feet.  Skin: erythema of right foot extending to mid foot Neuro: alert and oriented x4 Psych: pleasant mood.    Labs on Admission: I have personally reviewed following labs and imaging studies  CBC: Recent Labs  Lab 02/18/24 1525  WBC 14.8*  NEUTROABS 11.0*  HGB 10.4*  HCT 31.4*  MCV 94.0  PLT 154   Basic Metabolic Panel: Recent Labs  Lab 02/18/24 1525  NA 131*  K 4.9  CL 90*  CO2 22  GLUCOSE 220*  BUN 48*  CREATININE 10.10*  CALCIUM  9.7  MG 3.4*   GFR: Estimated Creatinine Clearance: 6.6 mL/min (A) (by C-G formula based on SCr of 10.1 mg/dL (H)). Liver Function Tests: Recent Labs  Lab 02/18/24 1525  AST 25  ALT 21  ALKPHOS 86  BILITOT 0.3  PROT 6.0*  ALBUMIN 2.9*   No results for input(s): LIPASE, AMYLASE in the last 168 hours. No results for input(s): AMMONIA in the last 168  hours. Coagulation Profile: No results for input(s): INR, PROTIME in the last 168 hours. Cardiac Enzymes: No results for input(s): CKTOTAL, CKMB, CKMBINDEX, TROPONINI, TROPONINIHS in the last 168 hours. BNP (last 3 results) No results for input(s): BNP in the last 8760 hours. HbA1C: No results for input(s): HGBA1C in the last 72 hours. CBG: No results for input(s): GLUCAP in the last 168 hours. Lipid Profile: No results for input(s): CHOL, HDL, LDLCALC, TRIG, CHOLHDL, LDLDIRECT in the last 72 hours. Thyroid Function Tests: No results for input(s): TSH, T4TOTAL, FREET4, T3FREE, THYROIDAB in the last 72 hours. Anemia  Panel: No results for input(s): VITAMINB12, FOLATE, FERRITIN, TIBC, IRON, RETICCTPCT in the last 72 hours. Urine analysis:    Component Value Date/Time   COLORURINE YELLOW (A) 09/09/2023 1348   APPEARANCEUR Clear 11/17/2023 0947   LABSPEC 1.015 09/09/2023 1348   LABSPEC 1.008 10/29/2012 0244   PHURINE 5.0 09/09/2023 1348   GLUCOSEU Negative 11/17/2023 0947   GLUCOSEU 50 mg/dL 92/73/7985 9755   HGBUR SMALL (A) 09/09/2023 1348   BILIRUBINUR Negative 11/17/2023 0947   BILIRUBINUR Negative 10/29/2012 0244   KETONESUR NEGATIVE 09/09/2023 1348   PROTEINUR 3+ (A) 11/17/2023 0947   PROTEINUR >=300 (A) 09/09/2023 1348   NITRITE Negative 11/17/2023 0947   NITRITE NEGATIVE 09/09/2023 1348   LEUKOCYTESUR Negative 11/17/2023 0947   LEUKOCYTESUR TRACE (A) 09/09/2023 1348   LEUKOCYTESUR Negative 10/29/2012 0244    Radiological Exams on Admission: I have personally reviewed images DG Toe Great Right Result Date: 02/18/2024 EXAM: VIEW(S) XRAY OF THE RIGHT TOES 02/18/2024 05:35:17 PM COMPARISON: None available. CLINICAL HISTORY: eval osteo. dry gangrene chronically, acute cellulitis streaking up foot FINDINGS: BONES AND JOINTS: Diffuse osteopenia. No radiographic findings of osteomyelitis, at this time. No acute fracture. No  focal osseous lesion. No joint dislocation. SOFT TISSUES: Soft tissue irregularity along the distal tip of the great toe with subcutaneous gas. Extensive peripheral vascular atherosclerosis. IMPRESSION: 1. Soft tissue irregularity along the distal tip of the great toe with subcutaneous gas, worrisome for ulceration or gas-producing infection. No radiographic findings of osteomyelitis,. at this time. Electronically signed by: Rogelia Myers MD 02/18/2024 05:38 PM EST RP Workstation: [id number]    EKG: My personal interpretation of EKG shows: pending    Assessment/Plan Principal Problem:   Cellulitis of right foot Active Problems:   Paroxysmal atrial fibrillation (HCC)   ESRD needing dialysis (HCC)   Type 2 diabetes mellitus with chronic kidney disease on chronic dialysis, without long-term current use of insulin  (HCC)   Atherosclerosis of native arteries of the extremities with ulceration (HCC)   PAD (peripheral artery disease)   Pt with poor perfusion of bilateral feet distal to midfoot with dry gangrene presenting with acute erythema c/w cellulitis. DG shows soft tissue edema and subcutaneous air concerning for infection. Getting MRI. Pt getting IV zosyn and vancomycin . Will consult podiatry. May need Vascular input as well. Pain under control. Will await MRI results. Pt could need surgical intervention so will keep NPO after midnight and hold his eliquis . ESR and CRP ordered.   Lactic acidosis: elevated but downtrending with IVF. Suspect secondary to infection.   Chronic Problems: HTN: monitor bp but restart coreg  tomorrow.  T2DM: monitor CBG HLD: continue home statin ESRD on PD: EDP discussed with nephrology regarding initiation of this. Appreciate their assisstance. Pt states he wants to skip it tonight.  PAF: hold eliquis  and start heparin  gtt.    VTE prophylaxis:  Heparin  Diet:Renal, NPO after midnight Code Status:  Full Code Telemetry:  Admission status: Inpatient,  Med-Surg Patient is from: Home Anticipated d/c is to: Home Anticipated d/c is in: 3-4 days   Family Communication: Updated at bedside  Consults called: Vascular surgery, Podiatry   Severity of Illness: The appropriate patient status for this patient is INPATIENT. Inpatient status is judged to be reasonable and necessary in order to provide the required intensity of service to ensure the patient's safety. The patient's presenting symptoms, physical exam findings, and initial radiographic and laboratory data in the context of their chronic comorbidities is felt to place them at high risk for further  clinical deterioration. Furthermore, it is not anticipated that the patient will be medically stable for discharge from the hospital within 2 midnights of admission.   * I certify that at the point of admission it is my clinical judgment that the patient will require inpatient hospital care spanning beyond 2 midnights from the point of admission due to high intensity of service, high risk for further deterioration and high frequency of surveillance required.DEWAINE Morene Bathe, MD [facility]ol[facility] DEL. [facility]

## 2024-02-18 NOTE — ED Notes (Signed)
 Pt to MRI

## 2024-02-18 NOTE — Consult Note (Signed)
 Pharmacy Antibiotic Note  Tyler Wiley is a 78 y.o. male admitted on 02/18/2024 with cellulitis. PMH includes HTN, HLD, T2DM, ESRD on PD.  Pharmacy has been consulted for Vancomycin  and Zosyn dosing.  Plan: Zosyn 2.25gm IV q 8 hrs, 1st dose 11/15 @ 0200 (3.375gm IV x 1 given in ED). Vancomycin  LD of 2000 mg given at 1719. Will order random level in 48hrs to determine future doses.   Height: 6' (182.9 cm) Weight: 85.1 kg (187 lb 11.2 oz) IBW/kg (Calculated) : 77.6  Temp (24hrs), Avg:97.9 F (36.6 C), Min:97.9 F (36.6 C), Max:97.9 F (36.6 C)  Recent Labs  Lab 02/18/24 1525 02/18/24 1740  WBC 14.8*  --   CREATININE 10.10*  --   LATICACIDVEN 4.6* 2.1*    Estimated Creatinine Clearance: 6.6 mL/min (A) (by C-G formula based on SCr of 10.1 mg/dL (H)).    Allergies  Allergen Reactions   Amlodipine  Swelling   Cefuroxime     Other: Mouth ulcers, tongue swollen.  Tolerated Ancef .   Shrimp Extract     Sometimes breaks out in hives    Antimicrobials this admission: Zosyn 11/14 >>  Vancomycin  11/14 >>    Microbiology results: 11/14 BCx: in process  Thank you for allowing pharmacy to be a part of this patient's care.  Sunaina Ferrando Rodriguez-Guzman PharmD, BCPS 02/18/2024 7:47 PM

## 2024-02-18 NOTE — ED Notes (Signed)
 Pt O2 reading at 89% the patient placed on 2L [place] at this time.

## 2024-02-18 NOTE — ED Triage Notes (Signed)
 Pt to ED for necrotic R great toe since months. Wife is nurse. Wife states pedal pulses are absent bilaterally but podiatrist got pulses yesterday with doppler  Hypotensive in triage  Pt gets PD nightly  Hx afib, HR in 40s, wife states this is normal for him  Sent 1 set blood cultures

## 2024-02-19 DIAGNOSIS — N186 End stage renal disease: Secondary | ICD-10-CM | POA: Diagnosis not present

## 2024-02-19 DIAGNOSIS — Z992 Dependence on renal dialysis: Secondary | ICD-10-CM

## 2024-02-19 DIAGNOSIS — I480 Paroxysmal atrial fibrillation: Secondary | ICD-10-CM

## 2024-02-19 DIAGNOSIS — L03115 Cellulitis of right lower limb: Secondary | ICD-10-CM | POA: Diagnosis not present

## 2024-02-19 DIAGNOSIS — D696 Thrombocytopenia, unspecified: Secondary | ICD-10-CM | POA: Insufficient documentation

## 2024-02-19 DIAGNOSIS — E871 Hypo-osmolality and hyponatremia: Secondary | ICD-10-CM | POA: Insufficient documentation

## 2024-02-19 LAB — BASIC METABOLIC PANEL WITH GFR
Anion gap: 13 (ref 5–15)
BUN: 50 mg/dL — ABNORMAL HIGH (ref 8–23)
CO2: 22 mmol/L (ref 22–32)
Calcium: 9 mg/dL (ref 8.9–10.3)
Chloride: 96 mmol/L — ABNORMAL LOW (ref 98–111)
Creatinine, Ser: 10.4 mg/dL — ABNORMAL HIGH (ref 0.61–1.24)
GFR, Estimated: 5 mL/min — ABNORMAL LOW (ref >60)
Glucose, Bld: 114 mg/dL — ABNORMAL HIGH (ref 70–99)
Potassium: 4.2 mmol/L (ref 3.5–5.1)
Sodium: 131 mmol/L — ABNORMAL LOW (ref 135–145)

## 2024-02-19 LAB — GLUCOSE, CAPILLARY
Glucose-Capillary: 108 mg/dL — ABNORMAL HIGH (ref 70–99)
Glucose-Capillary: 126 mg/dL — ABNORMAL HIGH (ref 70–99)
Glucose-Capillary: 162 mg/dL — ABNORMAL HIGH (ref 70–99)
Glucose-Capillary: 96 mg/dL (ref 70–99)

## 2024-02-19 LAB — CBC
HCT: 26.1 % — ABNORMAL LOW (ref 39.0–52.0)
Hemoglobin: 8.4 g/dL — ABNORMAL LOW (ref 13.0–17.0)
MCH: 30.5 pg (ref 26.0–34.0)
MCHC: 32.2 g/dL (ref 30.0–36.0)
MCV: 94.9 fL (ref 80.0–100.0)
Platelets: 108 K/uL — ABNORMAL LOW (ref 150–400)
RBC: 2.75 MIL/uL — ABNORMAL LOW (ref 4.22–5.81)
RDW: 14.4 % (ref 11.5–15.5)
WBC: 11.4 K/uL — ABNORMAL HIGH (ref 4.0–10.5)
nRBC: 0 % (ref 0.0–0.2)

## 2024-02-19 LAB — FERRITIN: Ferritin: 1797 ng/mL — ABNORMAL HIGH (ref 24–336)

## 2024-02-19 LAB — APTT
aPTT: 74 s — ABNORMAL HIGH (ref 24–36)
aPTT: 73 s — ABNORMAL HIGH (ref 24–36)

## 2024-02-19 LAB — IRON AND TIBC
Iron: 70 ug/dL (ref 45–182)
Saturation Ratios: 58 % — ABNORMAL HIGH (ref 17.9–39.5)
TIBC: 122 ug/dL — ABNORMAL LOW (ref 250–450)
UIBC: 51 ug/dL

## 2024-02-19 MED ORDER — OXYCODONE HCL 5 MG PO TABS
5.0000 mg | ORAL_TABLET | ORAL | Status: DC | PRN
  Filled 2024-02-19: qty 2

## 2024-02-19 MED ORDER — MORPHINE SULFATE (PF) 2 MG/ML IV SOLN
2.0000 mg | INTRAVENOUS | Status: DC | PRN
Start: 2024-02-19 — End: 2024-02-22
  Administered 2024-02-19 – 2024-02-21 (×4): 2 mg via INTRAVENOUS
  Filled 2024-02-19 (×4): qty 1

## 2024-02-19 MED ORDER — TORSEMIDE 20 MG PO TABS
10.0000 mg | ORAL_TABLET | Freq: Two times a day (BID) | ORAL | Status: DC
  Administered 2024-02-19 – 2024-02-22 (×5): 10 mg via ORAL
  Filled 2024-02-19 (×5): qty 1

## 2024-02-19 MED ORDER — GENTAMICIN SULFATE 0.1 % EX CREA
1.0000 | TOPICAL_CREAM | Freq: Every day | CUTANEOUS | Status: DC
  Administered 2024-02-19: 1 via TOPICAL
  Filled 2024-02-19: qty 15

## 2024-02-19 MED ORDER — DELFLEX-LC/1.5% DEXTROSE 344 MOSM/L IP SOLN
INTRAPERITONEAL | Status: DC
  Filled 2024-02-19 (×6): qty 3000

## 2024-02-19 MED ORDER — CARVEDILOL 3.125 MG PO TABS
6.2500 mg | ORAL_TABLET | Freq: Two times a day (BID) | ORAL | Status: DC
  Administered 2024-02-19 – 2024-02-22 (×5): 6.25 mg via ORAL
  Filled 2024-02-19 (×5): qty 2

## 2024-02-19 NOTE — Hospital Course (Signed)
 Tyler Wiley is a 78 y.o. year old male with medical history of HTN, HLD, T2DM, ESRD on PD presenting to ED with worsening right foot redness.  He has dry gangrene in the toes of bilateral foot, had a cellulitis of right foot. He is treated with vancomycin  and Zosyn.  Podiatry planning for right great toe ray amputation.  Also seen by vascular surgery, believes he has small vessel disease, no vascular intervention is planned.

## 2024-02-19 NOTE — Progress Notes (Signed)
 PODIATRY: PROGRESS NOTE     O/N: Tyler Wiley  Subjective:  Patient resting comfortably at bedside with wife Luke who provided additional history as to pedal condition.  Unfortunately patient had breakfast this AM and relays last PO approx 1030 - he has dialysis scheduled for today and reports this takes 8h.  Vascular colleagues came by this morning, and relayed that he has small vessel disease and no intervention is warranted.  He has some pain associated with his foot however this has improved since admission. Denies F/C/N/V/SOB/CP -overall systemically improved from admission yesterday evening.  Denies acute calf pain.    PHYSICAL EXAMINATION: BP 129/63 (BP Location: Left Arm)   Pulse 68   Temp 97.6 F (36.4 C)   Resp 17   Ht 6' (1.829 m)   Wt 86 kg   SpO2 100%   BMI 25.71 kg/m ? GEN: NAD. AOX3. ? RESP: Non-labored breathing on RA.? ABD: NT/ND of all four quadrants.? NEURO: Moving all four extremities spontaneously. ? ? FOCUSED LOWER EXTREMITY EXAMINATION:?   Neurological:  - Gross protective sensation diminished bilaterally.  - No focal motor or sensory deficits identified bilaterally.    Vascular:  - Dorsalis Pedis artery on palpation: nonpalpable LLE ; 1  on the RLE - Posterior Tibial artery on palpation: nonpalpable BLE - Capillary Filling Times: sluggish - Peripheral edema: localized to the RLE - forefoot     Musculoskeletal:  Muscle strength: 5/5 in all 4 quadrants  Ankle Joint ROM: decreased, equinus noted Global foot - TTP: mild to the R forefoot / hallux. - no crepitus.    Dermatological:  - Skin quality: atrophic - gangrenous as pictured.  - erythema to the midfoot -- R foot - distal necrosis to the hallux with plantar necrosis more extensively noted -stable from outline conducted - less erythema noted to the L foot - erythema associated primarily to the necrotic tissue (adjacent skin <1cm) - located to the dorsal distal hallux to level of HIPJ - distal 2nd digit  both dorsal and plantar to level of HIPJ - stable eschar to the 2nd and 3rd MPJ level dorsally.    Results for orders placed or performed during the hospital encounter of 02/18/24  Blood culture (routine x 2)     Status: None (Preliminary result)   Collection Time: 02/18/24  4:05 PM   Specimen: BLOOD  Result Value Ref Range Status   Specimen Description BLOOD BLOOD RIGHT ARM  Final   Special Requests   Final    BOTTLES DRAWN AEROBIC AND ANAEROBIC Blood Culture results may not be optimal due to an inadequate volume of blood received in culture bottles   Culture   Final    NO GROWTH < 24 HOURS Performed at [facility], [address] [place], [place] [address]    Report Status PENDING  Incomplete  Blood culture (routine x 2)     Status: None (Preliminary result)   Collection Time: 02/18/24  8:46 PM   Specimen: BLOOD  Result Value Ref Range Status   Specimen Description BLOOD BLOOD RIGHT ARM  Final   Special Requests   Final    BOTTLES DRAWN AEROBIC AND ANAEROBIC Blood Culture adequate volume   Culture   Final    NO GROWTH < 12 HOURS Performed at [facility], [address] [place], [place] [address]    Report Status PENDING  Incomplete    * No specimens in log * Narrative & Impression  CLINICAL DATA:  Soft tissue  infection suspected. History of diabetes and end-stage renal disease.   EXAM: MRI OF THE RIGHT FOREFOOT WITHOUT CONTRAST   TECHNIQUE: Multiplanar, multisequence MR imaging of the right forefoot was performed. No intravenous contrast was administered.   COMPARISON:  Radiographs of the great toe 02/18/2024.   FINDINGS: Bones/Joint/Cartilage   There is limited residual soft tissue in the distal great toe with apparent osseous exposure of the distal phalanx. There is heterogeneous T2 hyperintensity and T1 hypointensity within the distal 1st phalanx, suspicious for osteomyelitis or osteonecrosis. The proximal phalanx appears intact. No  significant interphalangeal joint effusion. Small amount of fluid within the 1st metatarsophalangeal joint, nonspecific. The additional toes and metatarsals appear intact. The visualized bones of the midfoot appear intact.   Ligaments   Intact Lisfranc ligament. The collateral ligaments of the metatarsophalangeal joints appear intact.   Muscles and Tendons   Generalized atrophy and T2 hyperintensity within the forefoot musculature. Trace fluid within the flexor digitorum longus tendon sheaths. The extensor tendons appear unremarkable.   Soft tissues   As above, attenuation of the soft tissues around the distal aspect of the great toe with possible osseous exposure. Prominent vascular calcifications and possible subcutaneous emphysema on recent radiographs. Generalized mild dorsal subcutaneous edema. No organized fluid collection identified.   IMPRESSION: 1. Attenuation of the soft tissues around the distal aspect of the great toe with possible osseous exposure of the distal phalanx. 2. Heterogeneous T2 hyperintensity and T1 hypointensity within the distal 1st phalanx, suspicious for osteomyelitis or osteonecrosis. 3. No evidence of soft tissue abscess. 4. 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     Electronically Signed   By: Elsie Perone M.D.   On: 02/19/2024 10:35    ASSESSMENT:?  Tyler Wiley is a 78 y.o. 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 Unfortunately patient ate this morning, with  stabilizing of his values /current clinical picture stable, and hemodialysis session scheduled, we will plan for surgery in a.m.  - Case request discussed with OR and schedule moved from 11/15 to 02/20/2024 - Consent order placed.   PLAN:? - ABX: Appreciate Medicine / ID / Pharmacy assist with antibiotic stewardship and appropriate coverage.  - Diet: NPO at midnight.  - Activity: NWB to the R  lower extremity.   - Wound Care: Daily paint with betadine.  - ABX: Appreciate assistance with antibiotic stewardship from medicine / pharmacy / ID services.  Anti-infectives (From admission, onward)    Start     Dose/Rate Route Frequency Ordered Stop   02/19/24 0200  piperacillin-tazobactam (ZOSYN) IVPB 2.25 g        2.25 g 100 mL/hr over 30 Minutes Intravenous Every 8 hours 02/18/24 1948     02/18/24 1945  vancomycin  variable dose per unstable renal function (pharmacist dosing)         Does not apply See admin instructions 02/18/24 1945     02/18/24 1700  piperacillin-tazobactam (ZOSYN) IVPB 3.375 g        3.375 g 100 mL/hr over 30 Minutes Intravenous  Once 02/18/24 1652 02/18/24 1806   02/18/24 1700  vancomycin  (VANCOREADY) IVPB 2000 mg/400 mL        2,000 mg 200 mL/hr over 120 Minutes Intravenous  Once 02/18/24 1652 02/18/24 1919       - Dispo: TBD surgical intervention

## 2024-02-19 NOTE — Progress Notes (Signed)
   02/19/24 1948  Peritoneal Catheter Mid lower abdomen  Placement Date/Time: (c) 01/20/19 (c) 1244   Catheter Location: Mid lower abdomen  Site Assessment Clean, Dry, Intact  Drainage Description None  Catheter status Accessed  Dressing Gauze/Drain sponge  Dressing Status Clean, Dry, Intact  Dressing Intervention New dressing/dressing changed  Cycler Setup  Total Number of Night Cycles 4  Night Fill Volume 2000  Dianeal Solution Dextrose  1.5% in 6000 mL Low Cal/Low Mag  Night Dwell Time per Cycle - Hour(s) 1  Night Dwell Time per Cycle - Minute(s) 30  Night Time Therapy - Minute(s) 58  Night Time Therapy - Hour(s) 7  Minimum Initial Drain Volume 0  Maximum Peritoneal Volume 3000  Night/Total Therapy Volume 8000  Day Exchange No  Completion  Treatment Status Started  Education / Care Plan  Dialysis Education Provided Yes  Hand-off documentation  Hand-off Given Given to shift RN/LPN  Report given to (Full Name) Janetta Judith Campillo  Hand-off Received Received from shift RN/LPN  Report received from (Full Name) Rosina RN

## 2024-02-19 NOTE — Plan of Care (Signed)
  Problem: Pain Managment: Goal: General experience of comfort will improve and/or be controlled Outcome: Progressing   Problem: Safety: Goal: Ability to remain free from injury will improve Outcome: Progressing

## 2024-02-19 NOTE — Consult Note (Signed)
 Pharmacy Consult Note - Anticoagulation  Pharmacy Consult for heparin  infusion Indication: atrial fibrillation Allergies  Allergen Reactions   Amlodipine  Swelling   Cefuroxime     Other: Mouth ulcers, tongue swollen.  Tolerated Ancef .   Shrimp Extract     Sometimes breaks out in hives    PATIENT MEASUREMENTS: Height: 6' (182.9 cm) Weight: 86 kg (189 lb 9.5 oz) IBW/kg (Calculated) : 77.6 HEPARIN  DW (KG): 85.1  VITAL SIGNS: Temp: 97.6 F (36.4 C) (11/15 0758) BP: 129/63 (11/15 0758) Pulse Rate: 68 (11/15 0758)  Recent Labs    02/18/24 2046 02/19/24 0538 02/19/24 0539 02/19/24 1351  HGB  --  8.4*  --   --   HCT  --  26.1*  --   --   PLT  --  108*  --   --   APTT 41* 74*  --  73*  LABPROT 17.2*  --   --   --   INR 1.3*  --   --   --   HEPARINUNFRC >1.10*  --   --   --   CREATININE  --   --  10.40*  --     Estimated Creatinine Clearance: 6.4 mL/min (A) (by C-G formula based on SCr of 10.4 mg/dL (H)).  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 60 tablet 2 02/18/2024   atorvastatin  (LIPITOR) 40 MG tablet Take 1 tablet (40 mg total) by mouth daily at 6 PM. 30 tablet 0 02/18/2024   carvedilol  (COREG ) 6.25 MG tablet Take 6.25 mg by mouth 2 (two) times daily with a meal.   02/18/2024    fluticasone  (FLONASE ) 50 MCG/ACT nasal spray Place 2 sprays into both nostrils daily as needed for allergies. Home med.   Unknown   metoCLOPramide (REGLAN) 5 MG tablet Take 5 mg by mouth every 8 (eight) hours as needed.   Unknown   ondansetron  (ZOFRAN -ODT) 8 MG disintegrating tablet Take 1-2 tablets (8-16 mg total) by mouth every 8 (eight) hours as needed for Nausea for up to 7 days   Unknown   oxyCODONE  (OXY IR/ROXICODONE ) 5 MG immediate release tablet Take 5-10 mg by mouth every 4 (four) hours as needed for severe pain (pain score 7-10).   Unknown   pantoprazole  (PROTONIX ) 40 MG tablet Take 40 mg by mouth daily.   02/18/2024   torsemide  (DEMADEX ) 10 MG tablet Take 10 mg by mouth 2 (two) times daily.   02/18/2024   clotrimazole-betamethasone (LOTRISONE) cream APPLY TO AFFECTED AREA TWICE A DAY (Patient not taking: Reported on 02/18/2024) 45 g 0 Not Taking   glimepiride  (AMARYL ) 2 MG tablet Take 2 mg by mouth daily with breakfast. (Patient not taking: Reported on  02/18/2024)   Not Taking   mupirocin  ointment (BACTROBAN ) 2 % Apply 1 Application topically daily. DAILY TO open sores on right hand, right foot and right great toe (Patient not taking: Reported on 02/18/2024) 22 g 0 Not Taking   Sulfamethoxazole -Trimethoprim  (SULFAMETHOXAZOLE -TMP DS PO)  (Patient not taking: Reported on 02/18/2024)   Not Taking   Scheduled:   atorvastatin   40 mg Oral q1800   carvedilol   6.25 mg Oral BID WC   gentamicin  cream  1 Application Topical Daily   pantoprazole   40 mg Oral Daily   sodium chloride  flush  3 mL Intravenous Q12H   torsemide   10 mg Oral BID   vancomycin  variable dose per unstable renal function (pharmacist dosing)   Does not apply See admin instructions   Infusions:   dialysis solution 1.5% low-MG/low-CA     heparin  1,200 Units/hr (02/19/24 1022)   piperacillin-tazobactam (ZOSYN)  IV 2.25 g (02/19/24 1311)   PRN: acetaminophen  **OR** acetaminophen , morphine  injection, oxyCODONE ,  senna-docusate  ASSESSMENT: 78 y.o. male with PMH s HTN, HLD, T2DM, ESRD on PD  is presenting with cellulitis on the right foot. Patient is on Eliquis   per chart review. Pharmacy has been consulted to initiate and manage heparin  intravenous infusion. Last Eliquis  dose: 1030 AM on 11/14  Goal(s) of therapy: Heparin  level 0.3 - 0.7 units/mL aPTT 66 - 102 seconds Monitor platelets by anticoagulation protocol: Yes   Baseline anticoagulation labs: Recent Labs    02/18/24 1525 02/18/24 2046 02/19/24 0538 02/19/24 1351  APTT  --  41* 74* 73*  INR  --  1.3*  --   --   HGB 10.4*  --  8.4*  --   PLT 154  --  108*  --     11/15 @ 0538  aPTT 74 (Drawn 1.5 hours early) 11/15 @ 1351 aPTT 73, therapeutic x 2  PLAN: Continue current heparin  drip rate Continue to monitor and adjust by aPTT until heparin  level and aPTT correlate then titrate by heparin  level alone. Check aPTT and heparin  level with next AM labs. Continue to monitor CBC daily while on heparin  infusion.  Tanna Loeffler A Raigen Jagielski, PharmD Clinical Pharmacist 02/19/2024 3:38 PM

## 2024-02-19 NOTE — Consult Note (Signed)
 [facility] Vascular Consult Note  MRN : [id number]  Tyler Wiley is a 78 y.o. ([birth year]) male who presents with chief complaint of  Chief Complaint  Patient presents with   Wound Check  .  History of Present Illness: 80 yom h/o DM, ESRD on Peritoneal Dialysis, AFIB on Eliquis . Presents with Bilateral toe gangrene. Known to service- evaluated in August 2025 for finger gangrene and chronic bilateral toe dry gangrene. Evaluation including angiogram of the upper extremity revealed microvascular pathology- He subsequently had finger amputation at [facility]. Regarding his lower extremities- non invasive imaging revealed triphasic signals with normal ABIs - evidence of medial calcification and microvascular pathology. He has been followed closely by Podiatry for stable gangrene of his toes. The patient was advised to present to the ER for progressive gangrene on 11/12, he refused. His wife brought him in last night as the redness and odor of the RIGHT foot worsened. The patient has peripheral neuropathy- denies pain. Denies claudication, denies rest pain.  Current Facility-Administered Medications  Medication Dose Route Frequency Provider Last Rate Last Admin   acetaminophen  (TYLENOL ) tablet 650 mg  650 mg Oral Q6H PRN Khan, Ghalib, MD   650 mg at 02/19/24 9762   Or   acetaminophen  (TYLENOL ) suppository 650 mg  650 mg Rectal Q6H PRN Fernand Prost, MD       atorvastatin  (LIPITOR) tablet 40 mg  40 mg Oral q1800 Fernand Prost, MD       dialysis solution 1.5% low-MG/low-CA dianeal solution   Intraperitoneal Q24H Dominica Brandy, MD       gentamicin  cream (GARAMYCIN ) 0.1 % 1 Application  1 Application Topical Daily Dominica Brandy, MD       heparin  ADULT infusion 100 units/mL (25000 units/250mL)  1,200 Units/hr Intravenous Continuous Fernand Prost, MD 12 mL/hr at 02/18/24 2242 1,200 Units/hr at 02/18/24 2242   morphine  (PF) 2 MG/ML injection 2 mg  2 mg Intravenous Q4H PRN Mansy, Jan  A, MD   2 mg at 02/19/24 1005   pantoprazole  (PROTONIX ) EC tablet 40 mg  40 mg Oral Daily Khan, Ghalib, MD   40 mg at 02/19/24 1005   piperacillin-tazobactam (ZOSYN) IVPB 2.25 g  2.25 g Intravenous Q8H Fernand Prost, MD 100 mL/hr at 02/19/24 0257 2.25 g at 02/19/24 0257   senna-docusate (Senokot-S) tablet 1 tablet  1 tablet Oral QHS PRN Fernand Prost, MD       sodium chloride  flush (NS) 0.9 % injection 3 mL  3 mL Intravenous Q12H Khan, Ghalib, MD   3 mL at 02/19/24 1018   vancomycin  variable dose per unstable renal function (pharmacist dosing)   Does not apply See admin instructions Fernand Prost, MD        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   L nose - LN2, 5FU/Calcipotriene cr   Stroke [facility])     Past Surgical History:  Procedure Laterality Date   CHOLECYSTECTOMY     CYSTOSCOPY WITH INSERTION OF UROLIFT N/A  01/20/2019   Procedure: CYSTOSCOPY WITH INSERTION OF UROLIFT;  Surgeon: Sherrilee Belvie CROME, MD;  Location: [facility] ORS;  Service: Urology;  Laterality: N/A;  30 MINS   UPPER EXTREMITY ANGIOGRAPHY N/A 12/02/2023   Procedure: Upper Extremity Angiography;  Surgeon: Marea Selinda RAMAN, MD;  Location: [facility] INVASIVE CV LAB;  Service: Cardiovascular;  Laterality: N/A;    Social History Social History   Tobacco Use   Smoking status: Former    Types: Cigarettes   Smokeless tobacco: Never  Substance Use Topics   Alcohol use: Yes    Comment: rare   Drug use: Never    Family History Family History  Problem Relation Age of Onset   CAD Father     Allergies  Allergen  Reactions   Amlodipine  Swelling   Cefuroxime     Other: Mouth ulcers, tongue swollen.  Tolerated Ancef .   Shrimp Extract     Sometimes breaks out in hives     REVIEW OF SYSTEMS (Negative unless checked)  Constitutional: [] Weight loss  [] Fever  [] Chills Cardiac: [] Chest pain   [] Chest pressure   [] Palpitations   [] Shortness of breath when laying flat   [] Shortness of breath at rest   [] Shortness of breath with exertion. Vascular:  [] Pain in legs with walking   [] Pain in legs at rest   [] Pain in legs when laying flat   [] Claudication   [] Pain in feet when walking  [] Pain in feet at rest  [] Pain in feet when laying flat   [] History of DVT   [] Phlebitis   [] Swelling in legs   [] Varicose veins   [] Non-healing ulcers Pulmonary:   [] Uses home oxygen   [] Productive cough   [] Hemoptysis   [] Wheeze  [] COPD   [] Asthma Neurologic:  [] Dizziness  [] Blackouts   [] Seizures   [] History of stroke   [] History of TIA  [] Aphasia   [] Temporary blindness   [] Dysphagia   [] Weakness or numbness in arms   [] Weakness or numbness in legs Musculoskeletal:  [] Arthritis   [] Joint swelling   [] Joint pain   [] Low back pain Hematologic:  [] Easy bruising  [] Easy bleeding   [] Hypercoagulable state   [] Anemic  [] Hepatitis Gastrointestinal:  [] Blood in stool   [] Vomiting blood  [] Gastroesophageal reflux/heartburn   [] Difficulty swallowing. Genitourinary:  [x] Chronic kidney disease   [] Difficult urination  [] Frequent urination  [] Burning with urination   [] Blood in urine Skin:  [] Rashes   [] Ulcers   [x] Wounds Psychological:  [] History of anxiety   []  History of major depression.  Physical Examination  Vitals:   02/19/24 0352 02/19/24 0500 02/19/24 0758 02/19/24 0830  BP: 114/76  129/63   Pulse: 61  68   Resp: 16  17   Temp: 98.2 F (36.8 C)  97.6 F (36.4 C)   TempSrc:      SpO2:   (!) 85% 100%  Weight:  86 kg    Height:       Body mass index is 25.71 kg/m. Gen:  WD/WN, NAD Head: [place]/AT, No temporalis wasting.  Prominent temp pulse not noted. Pulmonary:  Good air movement, respirations not labored, equal bilaterally.  Cardiac: Irregular Vascular:  Vessel Right Left  Radial Palpable Palpable  Ulnar    Brachial    Carotid Palpable, without bruit Palpable, without bruit  Aorta Not palpable N/A  Femoral Palpable Palpable  Popliteal    PT Palpable Palpable  DP Palpable Palpable   Gastrointestinal: soft, non-tender/non-distended. No guarding/reflex.  Musculoskeletal: M/S 5/5 throughout.  Gangrene of Right great  toe with erythema to midfoot.; LEFT great,2nd /3rd gangrene, eschar. Neurologic: Sensation grossly intact in extremities.  Symmetrical.  Speech is fluent. Motor exam as listed above. Psychiatric: Judgment intact, Mood & affect appropriate for pt's clinical situation.      CBC Lab Results  Component Value Date   WBC 11.4 (H) 02/19/2024   HGB 8.4 (L) 02/19/2024   HCT 26.1 (L) 02/19/2024   MCV 94.9 02/19/2024   PLT 108 (L) 02/19/2024    BMET    Component Value Date/Time   NA 131 (L) 02/19/2024 0539   NA 135 (L) 10/17/2012 1134   K 4.2 02/19/2024 0539   K 4.2 10/17/2012 1134   CL 96 (L) 02/19/2024 0539   CL 104 10/17/2012 1134   CO2 22 02/19/2024 0539   CO2 27 10/17/2012 1134   GLUCOSE 114 (H) 02/19/2024 0539   GLUCOSE 197 (H) 10/17/2012 1134   BUN 50 (H) 02/19/2024 0539   BUN 36 (H) 10/17/2012 1134   CREATININE 10.40 (H) 02/19/2024 0539   CREATININE 2.17 (H) 10/17/2012 1134   CALCIUM  9.0 02/19/2024 0539   CALCIUM  9.2 10/17/2012 1134   GFRNONAA 5 (L) 02/19/2024 0539   GFRNONAA 30 (L) 10/17/2012 1134   GFRAA 14 (L) 02/25/2019 0723   GFRAA 35 (L) 10/17/2012 1134   Estimated Creatinine Clearance: 6.4 mL/min (A) (by C-G formula based on SCr of 10.4 mg/dL (H)).  COAG Lab Results  Component Value Date   INR 1.3 (H) 02/18/2024   INR 1.3 (H) 01/06/2023   INR 0.9 02/25/2019    Radiology DG Toe Great Right Result Date: 02/18/2024 EXAM: VIEW(S) XRAY OF THE RIGHT TOES  02/18/2024 05:35:17 PM COMPARISON: None available. CLINICAL HISTORY: eval osteo. dry gangrene chronically, acute cellulitis streaking up foot FINDINGS: BONES AND JOINTS: Diffuse osteopenia. No radiographic findings of osteomyelitis, at this time. No acute fracture. No focal osseous lesion. No joint dislocation. SOFT TISSUES: Soft tissue irregularity along the distal tip of the great toe with subcutaneous gas. Extensive peripheral vascular atherosclerosis. IMPRESSION: 1. Soft tissue irregularity along the distal tip of the great toe with subcutaneous gas, worrisome for ulceration or gas-producing infection. No radiographic findings of osteomyelitis,. at this time. Electronically signed by: Rogelia Myers MD 02/18/2024 05:38 PM EST RP Workstation: [id number]      Assessment/Plan Gangrene of RIGHT great toe; LEFT great,2/3rd toes  Patient has microvascular pathology and medial calcification. Secondary to DM and ESRD Upon exam- palpable DP/PT bilaterally and review of his most recent ABI 11/2023- the patient has strong triphasic waveforms and nl ABIs  No Vascular Surgery intervention or further imaging recommended at this time as he has no evidence of reconstructable Vascular disease to improve his perfusion. Recommend Podiatry intervention for amputation ABX Supportive care   Tisa Curry LABOR, MD  02/19/2024 10:19 AM    This note was created with Dragon medical transcription system.  Any error is purely unintentional

## 2024-02-19 NOTE — Progress Notes (Signed)
 Progress Note   Patient: Tyler Wiley FMW:[id number] DOB: 09/18/1945 DOA: 02/18/2024     1 DOS: the patient was seen and examined on 02/19/2024   Brief hospital course: ASAHEL RISDEN is a 78 y.o. year old male with medical history of HTN, HLD, T2DM, ESRD on PD presenting to ED with worsening right foot redness.  He has dry gangrene in the toes of bilateral foot, had a cellulitis of right foot. He is treated with vancomycin  and Zosyn.  Podiatry planning for right great toe ray amputation.  Also seen by vascular surgery, believes he has small vessel disease, no vascular intervention is planned.   Principal Problem:   Cellulitis of right foot Active Problems:   Paroxysmal atrial fibrillation (HCC)   ESRD needing dialysis (HCC)   Type 2 diabetes mellitus with chronic kidney disease on chronic dialysis, without long-term current use of insulin  (HCC)   Atherosclerosis of native arteries of the extremities with ulceration (HCC)   PAD (peripheral artery disease)   Hyponatremia   Thrombocytopenia   Assessment and Plan: Right foot cellulitis. Bilateral toes gangrene. Peripheral vascular disease. Right foot x-ray showed soft tissue irregularity along the distal tip of the great toe with subcutaneous gas, worrisome for ulceration or gas-producing infection. Patient has significant lactic acidosis, but overall does not meet sepsis criteria. Blood culture sent out, will continue antibiotics with Zosyn and vancomycin . Discussed with vascular surgery, no plan for intervention, believe gangrene was caused by small vessel disease. Discussed with podiatry, planning for right great toe ray amputation. Patient is currently on heparin  drip, will continue for now.  Lactic acidosis. Patient had a severe lactic acidosis, appeared to be better after fluids. Patient does not meet sepsis criteria, the likely source is tissue ischemia from foot gangrene.  End-stage renal disease on peritoneal  dialysis. Hyponatremia. Discussed with nephrology, he will follow.  Anemia of end-stage renal disease. Thrombocytopenia. Check iron and B12 level.  Continue to follow.  Type 2 diabetes. Continue sliding scale insulin .  Paroxysmal atrial fibrillation. On heparin  drip, will transition to Eliquis  postop.     Subjective:  Patient still complaining some pain in the foot, otherwise not short of breath no cough.  Physical Exam: Vitals:   02/19/24 0352 02/19/24 0500 02/19/24 0758 02/19/24 0830  BP: 114/76  129/63   Pulse: 61  68   Resp: 16  17   Temp: 98.2 F (36.8 C)  97.6 F (36.4 C)   TempSrc:      SpO2:   (!) 85% 100%  Weight:  86 kg    Height:       General exam: Appears calm and comfortable  Respiratory system: Clear to auscultation. Respiratory effort normal. Cardiovascular system: S1 & S2 heard, RRR. No JVD, murmurs, rubs, gallops or clicks.  Gastrointestinal system: Abdomen is nondistended, soft and nontender. No organomegaly or masses felt. Normal bowel sounds heard. Central nervous system: Alert and oriented. No focal neurological deficits. Extremities: Right foot redness, but much better than yesterday.  Several toes gangrene in both foot. Skin: No rashes, lesions or ulcers Psychiatry: Judgement and insight appear normal. Mood & affect appropriate.    Data Reviewed:  Reviewed x-ray results, MRI still pending.  Reviewed lab results  Family Communication: Wife updated at bedside.  Disposition: Status is: Inpatient Remains inpatient appropriate because: Severity of disease, IV treatment, inpatient procedure.     Time spent: 55 minutes  Author: Murvin Mana, MD 02/19/2024 10:23 AM  For on call review [email or web address].

## 2024-02-19 NOTE — Progress Notes (Signed)
 [facility] CONSULT NOTE    Date: 02/19/2024                  Patient Name:  Tyler Wiley  MRN: [id number]  DOB: 08/28/1945  Age / Sex: 78 y.o., male         PCP: Lenon Layman ORN, MD                 Service Requesting Consult:Zhang                  Reason for Consult:      ESRD       History of Present Illness: Patient is a 78 y.o. male with a PMHx of diabetes, vascular disease, hypertension, atrial fibrillation, end-stage renal disease on peritoneal dialysis now comes in with history of worsening right foot gangrene and redness.  He was given a dose of vancomycin  and also was started on Zosyn.  Patient was evaluated by vascular and podiatry and the plan is to have amputation done.  Patient would like to continue peritoneal dialysis treatments while in the hospital.  Medications: Outpatient medications: Medications Prior to Admission  Medication Sig Dispense Refill Last Dose/Taking   apixaban  (ELIQUIS ) 5 MG TABS tablet Take 1 tablet (5 mg total) by mouth 2 (two) times daily. 60 tablet 2 02/18/2024   atorvastatin  (LIPITOR) 40 MG tablet Take 1 tablet (40 mg total) by mouth daily at 6 PM. 30 tablet 0 02/18/2024   carvedilol  (COREG ) 6.25 MG tablet Take 6.25 mg by mouth 2 (two) times daily with a meal.   02/18/2024   fluticasone  (FLONASE ) 50 MCG/ACT nasal spray Place 2 sprays into both nostrils daily as needed for allergies. Home med.   Unknown   metoCLOPramide (REGLAN) 5 MG tablet Take 5 mg by mouth every 8 (eight) hours as needed.   Unknown   ondansetron  (ZOFRAN -ODT) 8 MG disintegrating tablet Take 1-2 tablets (8-16 mg total) by mouth every 8 (eight) hours as needed for Nausea for up to 7 days   Unknown   oxyCODONE  (OXY IR/ROXICODONE ) 5 MG immediate release tablet Take 5-10 mg by mouth every 4 (four) hours as needed for severe pain (pain score 7-10).   Unknown   pantoprazole  (PROTONIX ) 40 MG tablet Take 40 mg by mouth daily.   02/18/2024   torsemide   (DEMADEX ) 10 MG tablet Take 10 mg by mouth 2 (two) times daily.   02/18/2024   clotrimazole-betamethasone (LOTRISONE) cream APPLY TO AFFECTED AREA TWICE A DAY (Patient not taking: Reported on 02/18/2024) 45 g 0 Not Taking   glimepiride  (AMARYL ) 2 MG tablet Take 2 mg by mouth daily with breakfast. (Patient not taking: Reported on 02/18/2024)   Not Taking   mupirocin  ointment (BACTROBAN ) 2 % Apply 1 Application topically daily. DAILY TO open sores on right hand, right foot and right great toe (Patient not taking: Reported on 02/18/2024) 22 g 0 Not Taking   Sulfamethoxazole -Trimethoprim  (SULFAMETHOXAZOLE -TMP DS PO)  (Patient not taking: Reported on 02/18/2024)   Not Taking    Discontinued Meds:   Medications Discontinued During This Encounter  Medication Reason   heparin  injection 5,000 Units    heparin  bolus via infusion 4,200 Units    heparin  ADULT infusion 100 units/mL (25000 units/250mL)     Current medications: Current Facility-Administered Medications  Medication Dose Route Frequency Provider Last Rate Last Admin   acetaminophen  (TYLENOL ) tablet 650 mg  650 mg Oral Q6H PRN Khan, Ghalib, MD   650 mg at  02/19/24 0237   Or   acetaminophen  (TYLENOL ) suppository 650 mg  650 mg Rectal Q6H PRN Khan, Ghalib, MD       atorvastatin  (LIPITOR) tablet 40 mg  40 mg Oral q1800 Khan, Ghalib, MD       carvedilol  (COREG ) tablet 6.25 mg  6.25 mg Oral BID WC Zhang, Dekui, MD       dialysis solution 1.5% low-MG/low-CA dianeal solution   Intraperitoneal Q24H Dominica Brandy, MD       gentamicin  cream (GARAMYCIN ) 0.1 % 1 Application  1 Application Topical Daily Keili Hasten, MD       heparin  ADULT infusion 100 units/mL (25000 units/250mL)  1,200 Units/hr Intravenous Continuous Fernand Prost, MD 12 mL/hr at 02/19/24 1022 1,200 Units/hr at 02/19/24 1022   morphine  (PF) 2 MG/ML injection 2 mg  2 mg Intravenous Q4H PRN Mansy, Jan A, MD   2 mg at 02/19/24 1005   oxyCODONE  (Oxy IR/ROXICODONE ) immediate release  tablet 5-10 mg  5-10 mg Oral Q4H PRN Zhang, Dekui, MD       pantoprazole  (PROTONIX ) EC tablet 40 mg  40 mg Oral Daily Khan, Ghalib, MD   40 mg at 02/19/24 1005   piperacillin-tazobactam (ZOSYN) IVPB 2.25 g  2.25 g Intravenous Q8H Khan, Ghalib, MD 100 mL/hr at 02/19/24 0257 2.25 g at 02/19/24 0257   senna-docusate (Senokot-S) tablet 1 tablet  1 tablet Oral QHS PRN Fernand Prost, MD       sodium chloride  flush (NS) 0.9 % injection 3 mL  3 mL Intravenous Q12H Khan, Ghalib, MD   3 mL at 02/19/24 1018   torsemide  (DEMADEX ) tablet 10 mg  10 mg Oral BID Zhang, Dekui, MD       vancomycin  variable dose per unstable renal function (pharmacist dosing)   Does not apply See admin instructions Fernand Prost, MD          Allergies: Allergies  Allergen Reactions   Amlodipine  Swelling   Cefuroxime     Other: Mouth ulcers, tongue swollen.  Tolerated Ancef .   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   L nose - LN2, 5FU/Calcipotriene cr   Stroke [facility])      Past Surgical History: Past Surgical History:  Procedure Laterality Date   CHOLECYSTECTOMY     CYSTOSCOPY WITH INSERTION OF UROLIFT N/A 01/20/2019   Procedure: CYSTOSCOPY WITH INSERTION OF UROLIFT;  Surgeon: Sherrilee Belvie CROME, MD;  Location: [facility] OR[facility];  Service: Urology;  Laterality: N/A;  30 MINS   UPPER EXTREMITY ANGIOGRAPHY N/A 12/02/2023   Procedure: Upper Extremity Angiography;  Surgeon: Marea Selinda RAMAN, MD;  Location: [facility] INVASIVE CV LAB;  Service:  Cardiovascular;  Laterality: N/A;     Family History: Family History  Problem Relation Age of Onset   CAD Father      Social History: Social History   Socioeconomic History   Marital status: Married  Spouse name: Not on file   Number of children: Not on file   Years of education: Not on file   Highest education level: Not on file  Occupational History   Not on file  Tobacco Use   Smoking status: Former    Types: Cigarettes   Smokeless tobacco: Never  Substance and Sexual Activity   Alcohol use: Yes    Comment: rare   Drug use: Never   Sexual activity: Yes  Other Topics Concern   Not on file  Social History Narrative   Not on file   Social Drivers of Health   Financial Resource Strain: Low Risk  (02/16/2024)   Received from [facility]   Overall Financial Resource Strain (CARDIA)    Difficulty of Paying Living Expenses: Not very hard  Food Insecurity: No Food Insecurity (02/19/2024)   Hunger Vital Sign    Worried About Running Out of Food in the Last Year: Never true    Ran Out of Food in the Last Year: Never true  Transportation Needs: No Transportation Needs (02/19/2024)   PRAPARE - Administrator, Civil Service (Medical): No    Lack of Transportation (Non-Medical): No  Physical Activity: Not on file  Stress: Not on file  Social Connections: Unknown (02/19/2024)   Social Connection and Isolation Panel    Frequency of Communication with Friends and Family: More than three times a week    Frequency of Social Gatherings with Friends and Family: Never    Attends Religious Services: More than 4 times per year    Active Member of [employer] or Organizations: Patient declined    Attends Banker Meetings: Patient declined    Marital Status: Married  Catering Manager Violence: Not At Risk (02/19/2024)   Humiliation, Afraid, Rape, and Kick questionnaire    Fear of Current or Ex-Partner: No    Emotionally Abused: No     Physically Abused: No    Sexually Abused: No     Review of Systems: As per HPI  Vital Signs: Blood pressure 129/63, pulse 68, temperature 97.6 F (36.4 C), resp. rate 17, height 6' (1.829 m), weight 86 kg, SpO2 100%.  Weight trends: Filed Weights   02/18/24 1521 02/19/24 0500  Weight: 85.1 kg 86 kg    Physical Exam: Physical Exam: General:  No acute distress  Head:  Normocephalic, atraumatic. Moist oral mucosal membranes  Eyes:  Anicteric  Neck:  Supple  Lungs:   Clear to auscultation, normal effort  Heart:  S1S2 no rubs  Abdomen:   Soft, nontender, bowel sounds present  Extremities: Gangrenous toes  Neurologic:  Awake, alert, following commands  Skin:  No lesions  Access:     Lab results:  Basic Metabolic Panel: Recent Labs  Lab 02/18/24 1525 02/19/24 0539  NA 131* 131*  K 4.9 4.2  CL 90* 96*  CO2 22 22  GLUCOSE 220* 114*  BUN 48* 50*  CREATININE 10.10* 10.40*  CALCIUM  9.7 9.0  MG 3.4*  --     Creatinine  Date/Time Value Ref Range Status  10/17/2012 11:34 AM 2.17 (H) 0.60 - 1.30 mg/dL Final   Creatinine, Ser  Date/Time Value Ref Range Status  02/19/2024 05:39 AM 10.40 (H) 0.61 - 1.24 mg/dL Final  88/85/7974 96:74 PM 10.10 (H) 0.61 - 1.24 mg/dL Final  93/94/7974 88:78 AM 10.44 (H) 0.61 - 1.24 mg/dL Final  89/94/7975 95:49 AM 10.36 (H) 0.61 - 1.24 mg/dL Final  89/95/7975 96:60 AM 9.62 (  H) 0.61 - 1.24 mg/dL Final  89/96/7975 95:54 AM 9.65 (H) 0.61 - 1.24 mg/dL Final  89/98/7975 96:64 PM 10.27 (H) 0.61 - 1.24 mg/dL Final  88/78/7979 92:76 AM 4.52 (H) 0.61 - 1.24 mg/dL Final  89/83/7979 89:99 AM 4.39 (H) 0.61 - 1.24 mg/dL Final  90/81/7979 95:82 AM 5.41 (H) 0.61 - 1.24 mg/dL Final  91/81/7979 87:80 AM 6.74 (H) 0.61 - 1.24 mg/dL Final  97/96/7979 90:95 AM 4.62 (H) 0.61 - 1.24 mg/dL Final  97/96/7979 97:97 AM 4.57 (H) 0.61 - 1.24 mg/dL Final  97/97/7979 92:46 AM 4.42 (H) 0.61 - 1.24 mg/dL Final  97/98/7979 90:91 AM 4.83 (H) 0.61 - 1.24 mg/dL Final   97/98/7979 90:91 AM 4.83 (H) 0.61 - 1.24 mg/dL Final  88/87/7980 96:85 AM 3.77 (H) 0.61 - 1.24 mg/dL Final  88/88/7980 98:62 PM 3.97 (H) 0.61 - 1.24 mg/dL Final    CBC: Recent Labs  Lab 02/18/24 1525 02/19/24 0538  WBC 14.8* 11.4*  NEUTROABS 11.0*  --   HGB 10.4* 8.4*  HCT 31.4* 26.1*  MCV 94.0 94.9  PLT 154 108*    Microbiology: Results for orders placed or performed during the hospital encounter of 02/18/24  Blood culture (routine x 2)     Status: None (Preliminary result)   Collection Time: 02/18/24  4:05 PM   Specimen: BLOOD  Result Value Ref Range Status   Specimen Description BLOOD BLOOD RIGHT ARM  Final   Special Requests   Final    BOTTLES DRAWN AEROBIC AND ANAEROBIC Blood Culture results may not be optimal due to an inadequate volume of blood received in culture bottles   Culture   Final    NO GROWTH < 24 HOURS Performed at [facility], [address] [place], [place] [address]    Report Status PENDING  Incomplete  Blood culture (routine x 2)     Status: None (Preliminary result)   Collection Time: 02/18/24  8:46 PM   Specimen: BLOOD  Result Value Ref Range Status   Specimen Description BLOOD BLOOD RIGHT ARM  Final   Special Requests   Final    BOTTLES DRAWN AEROBIC AND ANAEROBIC Blood Culture adequate volume   Culture   Final    NO GROWTH < 12 HOURS Performed at [facility], [address], [place], [place] [address]    Report Status PENDING  Incomplete    Urinalysis: No results for input(s): COLORURINE, LABSPEC, PHURINE, GLUCOSEU, HGBUR, BILIRUBINUR, KETONESUR, PROTEINUR, UROBILINOGEN, NITRITE, LEUKOCYTESUR in the last 72 hours.  Invalid input(s): APPERANCEUR   Imaging:  MR FOOT RIGHT WO CONTRAST Result Date: 02/19/2024 CLINICAL DATA:  Soft tissue infection suspected. History of diabetes and end-stage renal disease. EXAM: MRI OF THE RIGHT FOREFOOT WITHOUT CONTRAST TECHNIQUE: Multiplanar, multisequence  MR imaging of the right forefoot was performed. No intravenous contrast was administered. COMPARISON:  Radiographs of the great toe 02/18/2024. FINDINGS: Bones/Joint/Cartilage There is limited residual soft tissue in the distal great toe with apparent osseous exposure of the distal phalanx. There is heterogeneous T2 hyperintensity and T1 hypointensity within the distal 1st phalanx, suspicious for osteomyelitis or osteonecrosis. The proximal phalanx appears intact. No significant interphalangeal joint effusion. Small amount of fluid within the 1st metatarsophalangeal joint, nonspecific. The additional toes and metatarsals appear intact. The visualized bones of the midfoot appear intact. Ligaments Intact Lisfranc ligament. The collateral ligaments of the metatarsophalangeal joints appear intact. Muscles and Tendons Generalized atrophy and T2 hyperintensity within the forefoot musculature. Trace fluid within the flexor digitorum  longus tendon sheaths. The extensor tendons appear unremarkable. Soft tissues As above, attenuation of the soft tissues around the distal aspect of the great toe with possible osseous exposure. Prominent vascular calcifications and possible subcutaneous emphysema on recent radiographs. Generalized mild dorsal subcutaneous edema. No organized fluid collection identified. IMPRESSION: 1. Attenuation of the soft tissues around the distal aspect of the great toe with possible osseous exposure of the distal phalanx. 2. Heterogeneous T2 hyperintensity and T1 hypointensity within the distal 1st phalanx, suspicious for osteomyelitis or osteonecrosis. 3. No evidence of soft tissue abscess. 4. 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 Electronically Signed   By: Elsie Perone M.D.   On: 02/19/2024 10:35   DG Toe Great Right Result Date: 02/18/2024 EXAM: VIEW(S) XRAY OF THE RIGHT TOES 02/18/2024 05:35:17 PM COMPARISON: None  available. CLINICAL HISTORY: eval osteo. dry gangrene chronically, acute cellulitis streaking up foot FINDINGS: BONES AND JOINTS: Diffuse osteopenia. No radiographic findings of osteomyelitis, at this time. No acute fracture. No focal osseous lesion. No joint dislocation. SOFT TISSUES: Soft tissue irregularity along the distal tip of the great toe with subcutaneous gas. Extensive peripheral vascular atherosclerosis. IMPRESSION: 1. Soft tissue irregularity along the distal tip of the great toe with subcutaneous gas, worrisome for ulceration or gas-producing infection. No radiographic findings of osteomyelitis,. at this time. Electronically signed by: Rogelia Myers MD 02/18/2024 05:38 PM EST RP Workstation: [id number]     Assessment & Plan:  78 y.o. male with a PMHx of diabetes, vascular disease, hypertension, atrial fibrillation, end-stage renal disease on peritoneal dialysis now comes in with history of worsening right foot gangrene and redness.  He was given a dose of vancomycin  and also was started on Zosyn.  Patient was evaluated by vascular and podiatry and the plan is to have amputation done.  Patient would like to continue peritoneal dialysis treatments while in the hospital.   Principal Problem:   Cellulitis of right foot Active Problems:   Type 2 diabetes mellitus with chronic kidney disease on chronic dialysis, without long-term current use of insulin  (HCC)   Paroxysmal atrial fibrillation (HCC)   ESRD needing dialysis (HCC)   Atherosclerosis of native arteries of the extremities with ulceration (HCC)   PAD (peripheral artery disease)   Hyponatremia   Thrombocytopenia  #1: End-stage renal disease: Will resume peritoneal dialysis treatments with [facility] while in the hospital.  Spoke to the patient and his wife at bedside in detail and advised them of the protocol for peritoneal dialysis in the hospital.  Orders written.  #2: Cellulitis of the right foot: Patient is being evaluated by  vascular and podiatry.  Will continue vancomycin  and Zosyn as ordered.  Foot MRI is suspicious for osteomyelitis.  Patient most likely will go for amputation of the toe.   #3: Anemia: Will continue anemia protocols.  #4: Secondary hyperparathyroidism: Will monitor PTH, calcium  and phosphorus levels.  Labs and medications reviewed. Spoke to the patient and his wife at bedside in detail.  LOS: 1 Pinkey Edman, MD [facility]. 11/15/202511:56 AM

## 2024-02-19 NOTE — Consult Note (Addendum)
 Pharmacy Consult Note - Anticoagulation  Pharmacy Consult for heparin  infusion Indication: atrial fibrillation Allergies  Allergen Reactions   Amlodipine  Swelling   Cefuroxime     Other: Mouth ulcers, tongue swollen.  Tolerated Ancef .   Shrimp Extract     Sometimes breaks out in hives    PATIENT MEASUREMENTS: Height: 6' (182.9 cm) Weight: 86 kg (189 lb 9.5 oz) IBW/kg (Calculated) : 77.6 HEPARIN  DW (KG): 85.1  VITAL SIGNS: Temp: 98.2 F (36.8 C) (11/15 0352) BP: 114/76 (11/15 0352) Pulse Rate: 61 (11/15 0352)  Recent Labs    02/18/24 1525 02/18/24 1525 02/18/24 2046 02/19/24 0538  HGB 10.4*  --   --  8.4*  HCT 31.4*  --   --  26.1*  PLT 154  --   --  108*  APTT  --    < > 41* 74*  LABPROT  --   --  17.2*  --   INR  --   --  1.3*  --   HEPARINUNFRC  --   --  >1.10*  --   CREATININE 10.10*  --   --   --    < > = values in this interval not displayed.    Estimated Creatinine Clearance: 6.6 mL/min (A) (by C-G formula based on SCr of 10.1 mg/dL (H)).  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 60 tablet 2 02/18/2024   atorvastatin  (LIPITOR) 40 MG tablet Take 1 tablet (40 mg total) by mouth daily at 6 PM. 30 tablet 0 02/18/2024   carvedilol  (COREG ) 6.25 MG tablet Take 6.25 mg by mouth  2 (two) times daily with a meal.   02/18/2024   fluticasone  (FLONASE ) 50 MCG/ACT nasal spray Place 2 sprays into both nostrils daily as needed for allergies. Home med.   Unknown   metoCLOPramide (REGLAN) 5 MG tablet Take 5 mg by mouth every 8 (eight) hours as needed.   Unknown   ondansetron  (ZOFRAN -ODT) 8 MG disintegrating tablet Take 1-2 tablets (8-16 mg total) by mouth every 8 (eight) hours as needed for Nausea for up to 7 days   Unknown   oxyCODONE  (OXY IR/ROXICODONE ) 5 MG immediate release tablet Take 5-10 mg by mouth every 4 (four) hours as needed for severe pain (pain score 7-10).   Unknown   pantoprazole  (PROTONIX ) 40 MG tablet Take 40 mg by mouth daily.   02/18/2024   torsemide  (DEMADEX ) 10 MG tablet Take 10 mg by mouth 2 (two) times daily.   02/18/2024   clotrimazole-betamethasone (LOTRISONE) cream APPLY TO AFFECTED AREA TWICE A DAY (Patient not taking: Reported on 02/18/2024) 45 g 0 Not Taking   glimepiride  (AMARYL ) 2 MG tablet Take 2 mg by mouth  daily with breakfast. (Patient not taking: Reported on 02/18/2024)   Not Taking   mupirocin  ointment (BACTROBAN ) 2 % Apply 1 Application topically daily. DAILY TO open sores on right hand, right foot and right great toe (Patient not taking: Reported on 02/18/2024) 22 g 0 Not Taking   Sulfamethoxazole -Trimethoprim  (SULFAMETHOXAZOLE -TMP DS PO)  (Patient not taking: Reported on 02/18/2024)   Not Taking   Scheduled:   atorvastatin   40 mg Oral q1800   pantoprazole   40 mg Oral Daily   sodium chloride  flush  3 mL Intravenous Q12H   vancomycin  variable dose per unstable renal function (pharmacist dosing)   Does not apply See admin instructions   Infusions:   heparin  1,200 Units/hr (02/18/24 2242)   lactated ringers  100 mL/hr at 02/18/24 2232   piperacillin-tazobactam (ZOSYN)  IV 2.25 g (02/19/24 0257)   PRN: acetaminophen  **OR** acetaminophen , morphine  injection, senna-docusate  ASSESSMENT: 78 y.o. male with PMH s HTN, HLD, T2DM, ESRD on PD  is  presenting with cellulitis on the right foot. Patient is on Eliquis   per chart review. Pharmacy has been consulted to initiate and manage heparin  intravenous infusion. Last Eliquis  dose: 1030 AM on 11/14  Goal(s) of therapy: Heparin  level 0.3 - 0.7 units/mL aPTT 66 - 102 seconds Monitor platelets by anticoagulation protocol: Yes   Baseline anticoagulation labs: Recent Labs    02/18/24 1525 02/18/24 2046 02/19/24 0538  APTT  --  41* 74*  INR  --  1.3*  --   HGB 10.4*  --  8.4*  PLT 154  --  108*    11/15 @ 0538  aPTT 74 (Drawn 1.5 hours early)  PLAN: 11/15 @ 0538 aPTT 74 is therapeutic x 1.  Recheck aPTT in 8 hours. Continue to monitor and adjust by aPTT until heparin  level and aPTT correlate then titrate by heparin  level alone. Check heparin  level with next AM labs. Continue to monitor CBC daily while on heparin  infusion.  Estill CHRISTELLA Lutes, PharmD, BCPS Clinical Pharmacist 02/19/2024 7:10 AM

## 2024-02-19 NOTE — Progress Notes (Signed)
 Pt asked this writer to assist him trying to urinate by standing up the bedside using a urinal; as he was about done ; his one knee buckled and this writer was standing beside the patient,provided physical support to prevent a complete fall.The pt was safely guided back to his bed in a controlled manner.Pt said he was ok, no head involvement; no loss of consciousness.Pt remained alert and oriented x4; denied dizziness;chest pain or shortness of breath.Consulting Civil Engineer and Provider notified of this assisted fall incident.

## 2024-02-20 ENCOUNTER — Encounter
Admission: EM | Disposition: A | Discharge status: Home Health | Payer: Self-pay | Source: Home / Self Care | Attending: Internal Medicine

## 2024-02-20 ENCOUNTER — Encounter: Payer: Self-pay | Admitting: Internal Medicine

## 2024-02-20 ENCOUNTER — Inpatient Hospital Stay: Admitting: Certified Registered"

## 2024-02-20 ENCOUNTER — Inpatient Hospital Stay

## 2024-02-20 DIAGNOSIS — I480 Paroxysmal atrial fibrillation: Secondary | ICD-10-CM | POA: Diagnosis not present

## 2024-02-20 DIAGNOSIS — Z992 Dependence on renal dialysis: Secondary | ICD-10-CM | POA: Diagnosis not present

## 2024-02-20 DIAGNOSIS — N186 End stage renal disease: Secondary | ICD-10-CM | POA: Diagnosis not present

## 2024-02-20 DIAGNOSIS — L03115 Cellulitis of right lower limb: Secondary | ICD-10-CM | POA: Diagnosis not present

## 2024-02-20 HISTORY — PX: AMPUTATION: SHX166

## 2024-02-20 LAB — VANCOMYCIN, RANDOM: Vancomycin Rm: 14 ug/mL

## 2024-02-20 LAB — CBC
HCT: 27.5 % — ABNORMAL LOW (ref 39.0–52.0)
Hemoglobin: 9 g/dL — ABNORMAL LOW (ref 13.0–17.0)
MCH: 31 pg (ref 26.0–34.0)
MCHC: 32.7 g/dL (ref 30.0–36.0)
MCV: 94.8 fL (ref 80.0–100.0)
Platelets: 118 K/uL — ABNORMAL LOW (ref 150–400)
RBC: 2.9 MIL/uL — ABNORMAL LOW (ref 4.22–5.81)
RDW: 14.2 % (ref 11.5–15.5)
WBC: 11 K/uL — ABNORMAL HIGH (ref 4.0–10.5)
nRBC: 0 % (ref 0.0–0.2)

## 2024-02-20 LAB — GLUCOSE, CAPILLARY
Glucose-Capillary: 139 mg/dL — ABNORMAL HIGH (ref 70–99)
Glucose-Capillary: 134 mg/dL — ABNORMAL HIGH (ref 70–99)
Glucose-Capillary: 116 mg/dL — ABNORMAL HIGH (ref 70–99)
Glucose-Capillary: 237 mg/dL — ABNORMAL HIGH (ref 70–99)

## 2024-02-20 LAB — URINALYSIS, W/ REFLEX TO CULTURE (INFECTION SUSPECTED)
Bilirubin Urine: NEGATIVE
Glucose, UA: 150 mg/dL — AB
Hgb urine dipstick: NEGATIVE
Ketones, ur: NEGATIVE mg/dL
Leukocytes,Ua: NEGATIVE
Nitrite: NEGATIVE
Protein, ur: 100 mg/dL — AB
Specific Gravity, Urine: 1.011 (ref 1.005–1.030)
pH: 6 (ref 5.0–8.0)

## 2024-02-20 LAB — VITAMIN B12: Vitamin B-12: 222 pg/mL (ref 180–914)

## 2024-02-20 SURGERY — AMPUTATION, FOOT, PARTIAL
Anesthesia: General | Site: Foot | Laterality: Right

## 2024-02-20 MED ORDER — LIDOCAINE HCL (PF) 2 % IJ SOLN
INTRAMUSCULAR | Status: AC
  Filled 2024-02-20: qty 5

## 2024-02-20 MED ORDER — PROPOFOL 10 MG/ML IV BOLUS
INTRAVENOUS | Status: DC | PRN
  Administered 2024-02-20: 150 mg via INTRAVENOUS

## 2024-02-20 MED ORDER — LIDOCAINE HCL (PF) 1 % IJ SOLN
INTRAMUSCULAR | Status: AC
  Filled 2024-02-20: qty 30

## 2024-02-20 MED ORDER — FENTANYL CITRATE (PF) 100 MCG/2ML IJ SOLN
INTRAMUSCULAR | Status: DC | PRN
  Administered 2024-02-20: 50 ug via INTRAVENOUS
  Administered 2024-02-20 (×2): 25 ug via INTRAVENOUS

## 2024-02-20 MED ORDER — VITAMIN B-12 1000 MCG PO TABS
1000.0000 ug | ORAL_TABLET | Freq: Every day | ORAL | Status: DC
  Administered 2024-02-21 – 2024-02-22 (×2): 1000 ug via ORAL
  Filled 2024-02-20 (×2): qty 1

## 2024-02-20 MED ORDER — GLYCOPYRROLATE 0.2 MG/ML IJ SOLN
INTRAMUSCULAR | Status: AC
Start: 2024-02-20 — End: 2024-02-20
  Filled 2024-02-20: qty 1

## 2024-02-20 MED ORDER — PROPOFOL 500 MG/50ML IV EMUL
INTRAVENOUS | Status: DC | PRN
  Administered 2024-02-20: 100 ug/kg/min via INTRAVENOUS

## 2024-02-20 MED ORDER — BUPIVACAINE HCL (PF) 0.5 % IJ SOLN
INTRAMUSCULAR | Status: AC
  Filled 2024-02-20: qty 30

## 2024-02-20 MED ORDER — ONDANSETRON HCL 4 MG/2ML IJ SOLN
INTRAMUSCULAR | Status: AC
  Filled 2024-02-20: qty 2

## 2024-02-20 MED ORDER — FENTANYL CITRATE (PF) 100 MCG/2ML IJ SOLN: 25.0000 ug | INTRAMUSCULAR | Status: DC | PRN

## 2024-02-20 MED ORDER — 0.9 % SODIUM CHLORIDE (POUR BTL) OPTIME
TOPICAL | Status: DC | PRN
Start: 2024-02-20 — End: 2024-02-20
  Administered 2024-02-20: 500 mL

## 2024-02-20 MED ORDER — ONDANSETRON HCL 4 MG/2ML IJ SOLN
INTRAMUSCULAR | Status: DC | PRN
  Administered 2024-02-20: 4 mg via INTRAVENOUS

## 2024-02-20 MED ORDER — VANCOMYCIN HCL 1500 MG/300ML IV SOLN
1500.0000 mg | Freq: Once | INTRAVENOUS | Status: AC
  Administered 2024-02-20: 1500 mg via INTRAVENOUS
  Filled 2024-02-20: qty 300

## 2024-02-20 MED ORDER — PROPOFOL 10 MG/ML IV BOLUS
INTRAVENOUS | Status: AC
  Filled 2024-02-20: qty 20

## 2024-02-20 MED ORDER — PROPOFOL 1000 MG/100ML IV EMUL
INTRAVENOUS | Status: AC
  Filled 2024-02-20: qty 100

## 2024-02-20 MED ORDER — LIDOCAINE HCL 1 % IJ SOLN
INTRAMUSCULAR | Status: DC | PRN
  Administered 2024-02-20: 10 mL via SURGICAL_CAVITY

## 2024-02-20 MED ORDER — SODIUM CHLORIDE 0.9 % IV SOLN: INTRAVENOUS | Status: DC | PRN

## 2024-02-20 MED ORDER — LIDOCAINE HCL (PF) 2 % IJ SOLN
INTRAMUSCULAR | Status: DC | PRN
  Administered 2024-02-20: 50 mg

## 2024-02-20 MED ORDER — EPHEDRINE SULFATE (PRESSORS) 25 MG/5ML IV SOSY
PREFILLED_SYRINGE | INTRAVENOUS | Status: DC | PRN
  Administered 2024-02-20: 10 mg via INTRAVENOUS
  Administered 2024-02-20: 15 mg via INTRAVENOUS

## 2024-02-20 MED ORDER — GLYCOPYRROLATE 0.2 MG/ML IJ SOLN
INTRAMUSCULAR | Status: DC | PRN
  Administered 2024-02-20: .2 mg via INTRAVENOUS

## 2024-02-20 MED ORDER — EPHEDRINE 5 MG/ML INJ
INTRAVENOUS | Status: AC
  Filled 2024-02-20: qty 5

## 2024-02-20 MED ORDER — PHENYLEPHRINE 80 MCG/ML (10ML) SYRINGE FOR IV PUSH (FOR BLOOD PRESSURE SUPPORT)
PREFILLED_SYRINGE | INTRAVENOUS | Status: DC | PRN
Start: 2024-02-20 — End: 2024-02-20
  Administered 2024-02-20: 160 ug via INTRAVENOUS
  Administered 2024-02-20: 80 ug via INTRAVENOUS
  Administered 2024-02-20: 160 ug via INTRAVENOUS

## 2024-02-20 MED ORDER — DEXAMETHASONE SOD PHOSPHATE PF 10 MG/ML IJ SOLN
INTRAMUSCULAR | Status: DC | PRN
  Administered 2024-02-20: 10 mg via INTRAVENOUS

## 2024-02-20 MED ORDER — FENTANYL CITRATE (PF) 100 MCG/2ML IJ SOLN
INTRAMUSCULAR | Status: AC
  Filled 2024-02-20: qty 2

## 2024-02-20 SURGICAL SUPPLY — 39 items
BAG COUNTER SPONGE SURGICOUNT (BAG) IMPLANT
BLADE MED AGGRESSIVE (BLADE) IMPLANT
BLADE OSC/SAGITTAL 5.5X25 (BLADE) ×1 IMPLANT
BNDG COHESIVE 4X5 TAN STRL LF (GAUZE/BANDAGES/DRESSINGS) ×1 IMPLANT
BNDG ELASTIC 4X5.8 VLCR NS LF (GAUZE/BANDAGES/DRESSINGS) ×1 IMPLANT
BNDG ESMARCH 4X12 STRL LF (GAUZE/BANDAGES/DRESSINGS) ×1 IMPLANT
BNDG GAUZE DERMACEA FLUFF 4 (GAUZE/BANDAGES/DRESSINGS) ×1 IMPLANT
BNDG STRETCH 4X75 STRL LF (GAUZE/BANDAGES/DRESSINGS) ×1 IMPLANT
CNTNR URN SCR LID CUP LEK RST (MISCELLANEOUS) ×1 IMPLANT
CUFF TOURN SGL QUICK 12 (TOURNIQUET CUFF) IMPLANT
CUFF TOURN SGL QUICK 18X4 (TOURNIQUET CUFF) IMPLANT
DRAIN PENROSE 12X.25 LTX STRL (MISCELLANEOUS) IMPLANT
DRSG EMULSION OIL 3X3 NADH (GAUZE/BANDAGES/DRESSINGS) IMPLANT
DURAPREP 26ML APPLICATOR (WOUND CARE) ×1 IMPLANT
ELECTRODE REM PT RTRN 9FT ADLT (ELECTROSURGICAL) ×1 IMPLANT
GAUZE SPONGE 4X4 12PLY STRL (GAUZE/BANDAGES/DRESSINGS) ×2 IMPLANT
GAUZE XEROFORM 1X8 LF (GAUZE/BANDAGES/DRESSINGS) ×1 IMPLANT
GLOVE BIO SURGEON STRL SZ7.5 (GLOVE) ×1 IMPLANT
GLOVE BIOGEL PI IND STRL 8 (GLOVE) ×1 IMPLANT
GOWN STRL REUS W/ TWL XL LVL3 (GOWN DISPOSABLE) ×1 IMPLANT
GOWN STRL REUS W/TWL XL LVL4 (GOWN DISPOSABLE) ×1 IMPLANT
HANDLE YANKAUER SUCT BULB TIP (MISCELLANEOUS) IMPLANT
KIT TURNOVER KIT A (KITS) ×1 IMPLANT
LABEL OR SOLS (LABEL) IMPLANT
MANIFOLD NEPTUNE II (INSTRUMENTS) ×1 IMPLANT
NDL HYPO 25X1 1.5 SAFETY (NEEDLE) ×1 IMPLANT
NDL SAFETY ECLIP 18X1.5 (MISCELLANEOUS) ×1 IMPLANT
NEEDLE HYPO 25X1 1.5 SAFETY (NEEDLE) ×1 IMPLANT
NS IRRIG 500ML POUR BTL (IV SOLUTION) ×1 IMPLANT
PACK EXTREMITY ARMC (MISCELLANEOUS) ×1 IMPLANT
PAD ABD DERMACEA PRESS 5X9 (GAUZE/BANDAGES/DRESSINGS) ×2 IMPLANT
PENCIL SMOKE EVACUATOR (MISCELLANEOUS) ×1 IMPLANT
SOLUTION PREP PVP 2OZ (MISCELLANEOUS) ×1 IMPLANT
SPONGE T-LAP 18X18 ~~LOC~~+RFID (SPONGE) ×1 IMPLANT
STOCKINETTE M/LG 89821 (MISCELLANEOUS) ×1 IMPLANT
SUTURE EHLN 3-0 FS-10 30 BLK (SUTURE) ×1 IMPLANT
SYR 10ML LL (SYRINGE) ×2 IMPLANT
TRAP FLUID SMOKE EVACUATOR (MISCELLANEOUS) ×1 IMPLANT
WATER STERILE IRR 500ML POUR (IV SOLUTION) ×1 IMPLANT

## 2024-02-20 NOTE — Transfer of Care (Signed)
 Immediate Anesthesia Transfer of Care Note  Patient: Tyler Wiley  Procedure(s) Performed: AMPUTATION, FOOT, PARTIAL (Right: Foot)  Patient Location: PACU  Anesthesia Type:General  Level of Consciousness: awake, alert , and oriented  Airway & Oxygen Therapy: Patient Spontanous Breathing and Patient connected to face mask oxygen  Post-op Assessment: Report given to RN and Post -op Vital signs reviewed and stable  Post vital signs: Reviewed  Last Vitals:  Vitals Value Taken Time  BP 108/59   Temp    Pulse 81   Resp 18   SpO2 100     Last Pain:  Vitals:   02/20/24 0934  TempSrc:   PainSc: Asleep         Complications: No notable events documented.

## 2024-02-20 NOTE — Progress Notes (Signed)
   02/20/24 2032  Pain Assessment  Pain Scale 0-10  Pain Score 0  Faces Pain Scale 2  Neurological  Level of Consciousness Alert  Orientation Level Oriented X4  Respiratory  Respiratory Pattern Regular;Unlabored  Chest Assessment Chest expansion symmetrical  Psychosocial  Psychosocial (WDL) WDL  Patient Behaviors Calm   Tyler Wiley refused CCPD dialysis tonight.Pt states he has spoken to the  Nephrologist. Pt is coherent sitting in bed with his son visiting.

## 2024-02-20 NOTE — Brief Op Note (Signed)
 BRIEF POSTOPERATIVE NOTE    SURGEON: Daviana Haymaker G. Tanda, DPM  ASSISTANT(S): none  PRE-OPERATIVE DIAGNOSIS:  - gangrene, right foot - osteomyelitis, right foot   POST-OPERATIVE DIAGNOSIS: same  PROCEDURE: Partial hallux amputation, right foot  ANESTHESIA: general  ESTIMATED BLOOD LOSS: 10cc  SPECIMEN(S):   1) distal margin, right hallux  2) proximal margin, right hallux 3) deep tissue wound swab, right foot  COMPLICATIONS: none  CONDITION: stable  Disposition: transfer back to floor to resume inpatient care  Blanchard Willhite KANDICE Tanda

## 2024-02-20 NOTE — Progress Notes (Signed)
   02/20/24 0827  Peritoneal Catheter Mid lower abdomen  Placement Date/Time: (c) 01/20/19 (c) 1244   Catheter Location: Mid lower abdomen  Site Assessment Clean, Dry, Intact;Clean;Dry  Drainage Description None  Catheter status Deaccessed  Dressing Occlusive  Dressing Status Clean, Dry, Intact  Dressing Intervention New dressing/dressing changed  Completion  Treatment Status Complete  Initial Drain Volume 5  Average Dwell Time-Hour(s) 1  Average Dwell Time-Min(s) 12  Average Drain Time 64  Total Therapy Volume 7999  Total Therapy Time-Hour(s) 12  Total Therapy Time-Min(s) 35  Weight after Drain 190 lb 4.1 oz (86.3 kg)  Effluent Appearance Clear;Yellow  Fluid Balance - CCPD  Total UF (+ value on cycler, pt loss) 0 mL  Total UF (- value on cycler, pt gain) 618 mL  Procedure Comments  Tolerated treatment well? No (comment) (patient c/o machine starting and stopping.)  Peritoneal Dialysis Comments 0 fluid removed. gained. Patient has no physical issues. Patient  slightly agitated regarding the machine performance  Education / Care Plan  Dialysis Education Provided Yes  Hand-off documentation  Hand-off Given Given to shift RN/LPN  Report given to (Full Name) Lessie Moats RN  Hand-off Received Received from shift RN/LPN  Report received from (Full Name) Joane Manifold RN

## 2024-02-20 NOTE — Plan of Care (Signed)

## 2024-02-20 NOTE — Progress Notes (Signed)
 Progress Note   Patient: Tyler Wiley FMW:[id number] DOB: [birth year] DOA: 02/18/2024     2 DOS: the patient was seen and examined on 02/20/2024   Brief hospital course: MARINA DESIRE is a 78 y.o. year old male with medical history of HTN, HLD, T2DM, ESRD on PD presenting to ED with worsening right foot redness.  He has dry gangrene in the toes of bilateral foot, had a cellulitis of right foot. He is treated with vancomycin  and Zosyn.  Podiatry planning for right great toe ray amputation.  Also seen by vascular surgery, believes he has small vessel disease, no vascular intervention is planned.   Principal Problem:   Cellulitis of right foot Active Problems:   Paroxysmal atrial fibrillation (HCC)   ESRD needing dialysis (HCC)   Type 2 diabetes mellitus with chronic kidney disease on chronic dialysis, without long-term current use of insulin  (HCC)   Atherosclerosis of native arteries of the extremities with ulceration (HCC)   PAD (peripheral artery disease)   Hyponatremia   Thrombocytopenia   Assessment and Plan: Right foot cellulitis. Bilateral toes gangrene. Peripheral vascular disease. Right foot x-ray showed soft tissue irregularity along the distal tip of the great toe with subcutaneous gas, worrisome for ulceration or gas-producing infection. Patient has significant lactic acidosis, but overall does not meet sepsis criteria. Per vascular surgery, no plan for intervention, believe gangrene was caused by small vessel disease. Discussed with podiatry, planning for right great toe ray amputation today. Continue current antibiotics with Zosyn and vancomycin .   Lactic acidosis. Patient had a severe lactic acidosis, appeared to be better after fluids. Patient does not meet sepsis criteria, the likely source is tissue ischemia from foot gangrene.   End-stage renal disease on peritoneal dialysis. Hyponatremia. Patient is followed by nephrology, could not complete peritoneal  dialysis yesterday.   Anemia of end-stage renal disease. Thrombocytopenia. Iron normal, B12 level 222, check homocystine level.  Started on B12 supplement as a patient goal of B12 is 400 due to age.   Type 2 diabetes. Continue sliding scale insulin .   Paroxysmal atrial fibrillation. Anticoagulation on hold pending surgery.       Subjective:  Patient could not sleep last night due to malfunction of peritoneal dialysis machine.  No shortness of breath.  Physical Exam: Vitals:   02/19/24 2101 02/20/24 0432 02/20/24 0500 02/20/24 0736  BP: (!) 135/99 118/78  138/65  Pulse: 63 62  99  Resp: 18 16  17   Temp: 98.2 F (36.8 C) 98.2 F (36.8 C)  98 F (36.7 C)  TempSrc: Oral Oral  Oral  SpO2: 99% 100%  100%  Weight:   86 kg   Height:       General exam: Appears calm and comfortable  Respiratory system: Clear to auscultation. Respiratory effort normal. Cardiovascular system: S1 & S2 heard, RRR. No JVD, murmurs, rubs, gallops or clicks. No pedal edema. Gastrointestinal system: Abdomen is nondistended, soft and nontender. No organomegaly or masses felt. Normal bowel sounds heard. Central nervous system: Alert and oriented. No focal neurological deficits. Extremities: Symmetric 5 x 5 power. Skin: No rashes, lesions or ulcers Psychiatry: Judgement and insight appear normal. Mood & affect appropriate.    Data Reviewed:  Lab results reviewed.  Family Communication: Wife updated at bedside.  Disposition: Status is: Inpatient Remains inpatient appropriate because: Severity of disease, IV treatment, inpatient procedure.     Time spent: 35 minutes  Author: Murvin Mana, MD 02/20/2024 1:44 PM  For on call review [email or web address].

## 2024-02-20 NOTE — Anesthesia Preprocedure Evaluation (Signed)
 Anesthesia Evaluation  Patient identified by MRN, date of birth, ID band Patient awake    Reviewed: Allergy & Precautions, H&P , NPO status , Patient's Chart, lab work & pertinent test results, reviewed documented beta blocker date and time   History of Anesthesia Complications Negative for: history of anesthetic complications  Airway Mallampati: II  TM Distance: >3 FB Neck ROM: full    Dental  (+) Dental Advidsory Given, Partial Lower, Missing   Pulmonary neg shortness of breath, sleep apnea , neg COPD, neg recent URI, former smoker   Pulmonary exam normal breath sounds clear to auscultation       Cardiovascular Exercise Tolerance: Good hypertension, (-) angina + Peripheral Vascular Disease  (-) Past MI and (-) Cardiac Stents + dysrhythmias Atrial Fibrillation (-) Valvular Problems/Murmurs Rhythm:regular Rate:Normal     Neuro/Psych neg Seizures TIA negative psych ROS   GI/Hepatic Neg liver ROS,GERD  Controlled,,  Endo/Other  diabetes, Well Controlled, Type 2    Renal/GU ESRF, CRF and DialysisRenal disease  negative genitourinary   Musculoskeletal   Abdominal   Peds  Hematology negative hematology ROS (+)   Anesthesia Other Findings Past Medical History: No date: Atrial fibrillation (HCC) 10/13/2021: Basal cell carcinoma     Comment:  L chin - tx with ED&C 10/06/2023: BCC (basal cell carcinoma of skin)     Comment:  left mandible - treated with ED&C No date: CKD (chronic kidney disease) stage 4, GFR 15-29 ml/min (HCC)     Comment:  Peritoneal dialysis No date: Diabetes mellitus without complication (HCC) No date: Gastritis No date: Hypertension No date: Peripheral neuropathy No date: Renal disorder 10/13/2021: Squamous cell carcinoma of skin     Comment:  L nose - LN2, 5FU/Calcipotriene cr No date: Stroke (HCC)   Reproductive/Obstetrics negative OB ROS                               Anesthesia Physical Anesthesia Plan  ASA: 3  Anesthesia Plan: General   Post-op Pain Management:    Induction: Intravenous  PONV Risk Score and Plan: 2 and Propofol  infusion and TIVA  Airway Management Planned: LMA  Additional Equipment:   Intra-op Plan:   Post-operative Plan: Extubation in OR  Informed Consent: I have reviewed the patients History and Physical, chart, labs and discussed the procedure including the risks, benefits and alternatives for the proposed anesthesia with the patient or authorized representative who has indicated his/her understanding and acceptance.     Dental Advisory Given  Plan Discussed with: Anesthesiologist, CRNA and Surgeon  Anesthesia Plan Comments:         Anesthesia Quick Evaluation

## 2024-02-20 NOTE — Progress Notes (Signed)
 [facility]  PROGRESS NOTE   Subjective:   Patient seen at bedside.  Family is in attendance.  Did not sleep well last night.  Patient said he had problems with the dialysis machine last night.  Spoke to the dialysis nurse.    Objective:  Vital signs: Blood pressure 138/65, pulse 99, temperature 98 F (36.7 C), temperature source Oral, resp. rate 17, height 6' (1.829 m), weight 86 kg, SpO2 100%.  Intake/Output Summary (Last 24 hours) at 02/20/2024 1114 Last data filed at 02/20/2024 0827 Gross per 24 hour  Intake 1375.96 ml  Output 0 ml  Net 1375.96 ml   Filed Weights   02/18/24 1521 02/19/24 0500 02/20/24 0500  Weight: 85.1 kg 86 kg 86 kg     Physical Exam: General:  No acute distress  Head:  Normocephalic, atraumatic. Moist oral mucosal membranes  Eyes:  Anicteric  Neck:  Supple  Lungs:   Clear to auscultation, normal effort  Heart:  S1S2 no rubs  Abdomen:   Soft, nontender, bowel sounds present  Extremities:  peripheral edema.  Neurologic:  Awake, alert, following commands  Skin:  No lesions  Access:     Basic Metabolic Panel: Recent Labs  Lab 02/18/24 1525 02/19/24 0539  NA 131* 131*  K 4.9 4.2  CL 90* 96*  CO2 22 22  GLUCOSE 220* 114*  BUN 48* 50*  CREATININE 10.10* 10.40*  CALCIUM  9.7 9.0  MG 3.4*  --    GFR: Estimated Creatinine Clearance: 6.4 mL/min (A) (by C-G formula based on SCr of 10.4 mg/dL (H)).  Liver Function Tests: Recent Labs  Lab 02/18/24 1525  AST 25  ALT 21  ALKPHOS 86  BILITOT 0.3  PROT 6.0*  ALBUMIN 2.9*   No results for input(s): LIPASE, AMYLASE in the last 168 hours. No results for input(s): AMMONIA in the last 168 hours.  CBC: Recent Labs  Lab 02/18/24 1525 02/19/24 0538 02/20/24 0456  WBC 14.8* 11.4* 11.0*  NEUTROABS 11.0*  --   --   HGB 10.4* 8.4* 9.0*  HCT 31.4* 26.1* 27.5*  MCV 94.0 94.9 94.8  PLT 154 108* 118*     HbA1C: Hgb A1c MFr Bld  Date/Time Value Ref Range Status   01/05/2023 10:24 PM 7.5 (H) 4.8 - 5.6 % Final    Comment:    (NOTE) Pre diabetes:          5.7%-6.4%  Diabetes:              >6.4%  Glycemic control for   <7.0% adults with diabetes   01/18/2019 02:30 PM 5.9 (H) 4.8 - 5.6 % Final    Comment:    (NOTE) Pre diabetes:          5.7%-6.4% Diabetes:              >6.4% Glycemic control for   <7.0% adults with diabetes     Urinalysis: Recent Labs    02/20/24 0130  COLORURINE YELLOW*  LABSPEC 1.011  PHURINE 6.0  GLUCOSEU 150*  HGBUR NEGATIVE  BILIRUBINUR NEGATIVE  KETONESUR NEGATIVE  PROTEINUR 100*  NITRITE NEGATIVE  LEUKOCYTESUR NEGATIVE      Imaging: MR FOOT RIGHT WO CONTRAST Result Date: 02/19/2024 CLINICAL DATA:  Soft tissue infection suspected. History of diabetes and end-stage renal disease. EXAM: MRI OF THE RIGHT FOREFOOT WITHOUT CONTRAST TECHNIQUE: Multiplanar, multisequence MR imaging of the right forefoot was performed. No intravenous contrast was administered. COMPARISON:  Radiographs of the great toe 02/18/2024. FINDINGS:  Bones/Joint/Cartilage There is limited residual soft tissue in the distal great toe with apparent osseous exposure of the distal phalanx. There is heterogeneous T2 hyperintensity and T1 hypointensity within the distal 1st phalanx, suspicious for osteomyelitis or osteonecrosis. The proximal phalanx appears intact. No significant interphalangeal joint effusion. Small amount of fluid within the 1st metatarsophalangeal joint, nonspecific. The additional toes and metatarsals appear intact. The visualized bones of the midfoot appear intact. Ligaments Intact Lisfranc ligament. The collateral ligaments of the metatarsophalangeal joints appear intact. Muscles and Tendons Generalized atrophy and T2 hyperintensity within the forefoot musculature. Trace fluid within the flexor digitorum longus tendon sheaths. The extensor tendons appear unremarkable. Soft tissues As above, attenuation of the soft tissues around the  distal aspect of the great toe with possible osseous exposure. Prominent vascular calcifications and possible subcutaneous emphysema on recent radiographs. Generalized mild dorsal subcutaneous edema. No organized fluid collection identified. IMPRESSION: 1. Attenuation of the soft tissues around the distal aspect of the great toe with possible osseous exposure of the distal phalanx. 2. Heterogeneous T2 hyperintensity and T1 hypointensity within the distal 1st phalanx, suspicious for osteomyelitis or osteonecrosis. 3. No evidence of soft tissue abscess. 4. 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 Electronically Signed   By: Elsie Perone M.D.   On: 02/19/2024 10:35   DG Toe Great Right Result Date: 02/18/2024 EXAM: VIEW(S) XRAY OF THE RIGHT TOES 02/18/2024 05:35:17 PM COMPARISON: None available. CLINICAL HISTORY: eval osteo. dry gangrene chronically, acute cellulitis streaking up foot FINDINGS: BONES AND JOINTS: Diffuse osteopenia. No radiographic findings of osteomyelitis, at this time. No acute fracture. No focal osseous lesion. No joint dislocation. SOFT TISSUES: Soft tissue irregularity along the distal tip of the great toe with subcutaneous gas. Extensive peripheral vascular atherosclerosis. IMPRESSION: 1. Soft tissue irregularity along the distal tip of the great toe with subcutaneous gas, worrisome for ulceration or gas-producing infection. No radiographic findings of osteomyelitis,. at this time. Electronically signed by: Rogelia Myers MD 02/18/2024 05:38 PM EST RP Workstation: [id number]     Medications:    dialysis solution 1.5% low-MG/low-CA     piperacillin-tazobactam (ZOSYN)  IV 2.25 g (02/20/24 0622)    atorvastatin   40 mg Oral q1800   carvedilol   6.25 mg Oral BID WC   vitamin B-12  1,000 mcg Oral Daily   gentamicin  cream  1 Application Topical Daily   pantoprazole   40 mg Oral Daily   sodium chloride  flush  3  mL Intravenous Q12H   torsemide   10 mg Oral BID   vancomycin  variable dose per unstable renal function (pharmacist dosing)   Does not apply See admin instructions    Assessment/ Plan:     78 y.o. male with a PMHx of diabetes, vascular disease, hypertension, atrial fibrillation, end-stage renal disease on peritoneal dialysis now comes in with history of worsening right foot gangrene and redness.  He was given a dose of vancomycin  and also was started on Zosyn.  Patient was evaluated by vascular and podiatry and the plan is to have amputation done.  Patient would like to continue peritoneal dialysis treatments while in the hospital.     Principal Problem:   Cellulitis of right foot Active Problems:   Type 2 diabetes mellitus with chronic kidney disease on chronic dialysis, without long-term current use of insulin  (HCC)   Paroxysmal atrial fibrillation (HCC)   ESRD needing dialysis (HCC)   Atherosclerosis of native arteries of the extremities with ulceration (HCC)  PAD (peripheral artery disease)   Hyponatremia   Thrombocytopenia   #1: End-stage renal disease: Patient did CCPD last night.  There were problems with the machine last night.  Spoke to the patient and his wife at bedside in detail and advised them of the protocol for peritoneal dialysis in the hospital.  Patient and his wife says they would rather do dialysis at home tomorrow.    #2: Cellulitis of the right foot: Patient is being evaluated by vascular and podiatry.  Will continue vancomycin  and Zosyn as ordered.  Foot MRI is suspicious for osteomyelitis.  Patient most likely will go for amputation of the toe today.    #3: Anemia: Will continue anemia protocols.   #4: Secondary hyperparathyroidism: Will monitor PTH, calcium  and phosphorus levels.  Labs and medications reviewed. Will continue to follow along with you.   LOS: 2 Pinkey Edman, MD [facility] 11/16/202511:14 AM

## 2024-02-20 NOTE — Consult Note (Signed)
 Pharmacy Antibiotic Note  Tyler Wiley is a 78 y.o. male admitted on 02/18/2024 with cellulitis. PMH includes HTN, HLD, T2DM, ESRD on PD.  Pharmacy has been consulted for Vancomycin  and Zosyn dosing.  Plan: Vancomycin  random level on 11/16@1432 :  Will order Vancomycin  1500mg  IV x 1 Recheck random vanc level in 3-5 days if patient remains on peritoneal dialysis  Continue Zosyn 2.25gm IV q 8 hrs   Height: 6' (182.9 cm) Weight: 86 kg (189 lb 9.5 oz) IBW/kg (Calculated) : 77.6  Temp (24hrs), Avg:97.9 F (36.6 C), Min:97 F (36.1 C), Max:98.4 F (36.9 C)  Recent Labs  Lab 02/18/24 1525 02/18/24 1740 02/19/24 0538 02/19/24 0539 02/20/24 0456 02/20/24 1432  WBC 14.8*  --  11.4*  --  11.0*  --   CREATININE 10.10*  --   --  10.40*  --   --   LATICACIDVEN 4.6* 2.1*  --   --   --   --   VANCORANDOM  --   --   --   --   --  14    Estimated Creatinine Clearance: 6.4 mL/min (A) (by C-G formula based on SCr of 10.4 mg/dL (H)).    Allergies  Allergen Reactions   Amlodipine  Swelling   Cefuroxime     Other: Mouth ulcers, tongue swollen.  Tolerated Ancef .   Shrimp Extract     Sometimes breaks out in hives    Antimicrobials this admission: Zosyn 11/14 >>  Vancomycin  11/14 >>    Microbiology results: 11/14 BCx: NGTD 11/16: anaerobic cx: collected  Thank you for allowing pharmacy to be a part of this patient's care.  Calab Sachse A Brittanni Cariker, PharmD Clinical Pharmacist 02/20/2024 6:09 PM

## 2024-02-20 NOTE — Interval H&P Note (Signed)
 HISTORY AND PHYSICAL INTERVAL NOTE:  02/20/2024  3:07 PM  Tyler Wiley  has presented today for surgery, with the diagnosis of gas gangrene.  The various methods of treatment have been discussed with the patient.  No guarantees were given.  After consideration of risks, benefits and other options for treatment, the patient has consented to surgery.  I have reviewed the patients' chart and labs.    A history and physical examination and is consistent with current status today on re-examination. There have been no changes to this history and physical examination. Procedure as elected remains warranted.   Tyler Wiley Tyler Wiley

## 2024-02-20 NOTE — Anesthesia Procedure Notes (Signed)
 Procedure Name: LMA Insertion Date/Time: 02/20/2024 4:09 PM  Performed by: Leontine Katz, CRNAPre-anesthesia Checklist: Patient identified, Patient being monitored, Timeout performed, Emergency Drugs available and Suction available Patient Re-evaluated:Patient Re-evaluated prior to induction Oxygen Delivery Method: Circle system utilized Preoxygenation: Pre-oxygenation with 100% oxygen Induction Type: IV induction Ventilation: Mask ventilation without difficulty LMA: LMA inserted LMA Size: 5.0 Tube type: Oral Number of attempts: 1 Placement Confirmation: positive ETCO2 and breath sounds checked- equal and bilateral Tube secured with: Tape Dental Injury: Teeth and Oropharynx as per pre-operative assessment

## 2024-02-21 DIAGNOSIS — N186 End stage renal disease: Secondary | ICD-10-CM | POA: Diagnosis not present

## 2024-02-21 DIAGNOSIS — Z992 Dependence on renal dialysis: Secondary | ICD-10-CM | POA: Diagnosis not present

## 2024-02-21 DIAGNOSIS — L03115 Cellulitis of right lower limb: Secondary | ICD-10-CM | POA: Diagnosis not present

## 2024-02-21 DIAGNOSIS — I480 Paroxysmal atrial fibrillation: Secondary | ICD-10-CM | POA: Diagnosis not present

## 2024-02-21 LAB — CBC
HCT: 27.9 % — ABNORMAL LOW (ref 39.0–52.0)
Hemoglobin: 9.2 g/dL — ABNORMAL LOW (ref 13.0–17.0)
MCH: 31.4 pg (ref 26.0–34.0)
MCHC: 33 g/dL (ref 30.0–36.0)
MCV: 95.2 fL (ref 80.0–100.0)
Platelets: 128 K/uL — ABNORMAL LOW (ref 150–400)
RBC: 2.93 MIL/uL — ABNORMAL LOW (ref 4.22–5.81)
RDW: 14 % (ref 11.5–15.5)
WBC: 11.8 K/uL — ABNORMAL HIGH (ref 4.0–10.5)
nRBC: 0 % (ref 0.0–0.2)

## 2024-02-21 LAB — GLUCOSE, CAPILLARY
Glucose-Capillary: 114 mg/dL — ABNORMAL HIGH (ref 70–99)
Glucose-Capillary: 212 mg/dL — ABNORMAL HIGH (ref 70–99)
Glucose-Capillary: 202 mg/dL — ABNORMAL HIGH (ref 70–99)
Glucose-Capillary: 191 mg/dL — ABNORMAL HIGH (ref 70–99)
Glucose-Capillary: 197 mg/dL — ABNORMAL HIGH (ref 70–99)

## 2024-02-21 LAB — HOMOCYSTEINE: Homocysteine: 16.4 umol/L (ref 0.0–19.2)

## 2024-02-21 MED ORDER — TAMSULOSIN HCL 0.4 MG PO CAPS
0.4000 mg | ORAL_CAPSULE | Freq: Every day | ORAL | Status: DC
  Administered 2024-02-21: 0.4 mg via ORAL
  Filled 2024-02-21: qty 1

## 2024-02-21 MED ORDER — APIXABAN 5 MG PO TABS
5.0000 mg | ORAL_TABLET | Freq: Two times a day (BID) | ORAL | Status: DC
  Administered 2024-02-21 – 2024-02-22 (×3): 5 mg via ORAL
  Filled 2024-02-21 (×3): qty 1

## 2024-02-21 NOTE — Care Management Important Message (Signed)
 Important Message  Patient Details  Name: Tyler Wiley MRN: [id number] Date of Birth: 01/14/1946   Important Message Given:  Yes - Medicare IM     Rojelio SHAUNNA Rattler 02/21/2024, 5:28 PM

## 2024-02-21 NOTE — Progress Notes (Signed)
 Progress Note   Patient: Tyler Wiley FMW:[id number] DOB: 1945-12-11 DOA: 02/18/2024     3 DOS: the patient was seen and examined on 02/21/2024   Brief hospital course: ABDIKADIR FOHL is a 78 y.o. year old male with medical history of HTN, HLD, T2DM, ESRD on PD presenting to ED with worsening right foot redness.  He has dry gangrene in the toes of bilateral foot, had a cellulitis of right foot. He is treated with vancomycin  and Zosyn.  Had a first toe ray amputation 11/16.  Also seen by vascular surgery, believes he has small vessel disease, no vascular intervention is planned.   Principal Problem:   Cellulitis of right foot Active Problems:   Paroxysmal atrial fibrillation (HCC)   ESRD needing dialysis (HCC)   Type 2 diabetes mellitus with chronic kidney disease on chronic dialysis, without long-term current use of insulin  (HCC)   Atherosclerosis of native arteries of the extremities with ulceration (HCC)   PAD (peripheral artery disease)   Hyponatremia   Thrombocytopenia   Assessment and Plan: Right foot cellulitis. Bilateral toes gangrene. Peripheral vascular disease. Right foot x-ray showed soft tissue irregularity along the distal tip of the great toe with subcutaneous gas, worrisome for ulceration or gas-producing infection. Patient has significant lactic acidosis, but overall does not meet sepsis criteria. Per vascular surgery, no plan for intervention, believe gangrene was caused by small vessel disease. Discussed with podiatry, had right great toe ray amputation 11/16.  Culture pending. Continued antibiotics with Zosyn and vancomycin .     Lactic acidosis. Patient had a severe lactic acidosis, appeared to be better after fluids. Patient does not meet sepsis criteria, the likely source is tissue ischemia from foot gangrene.   End-stage renal disease on peritoneal dialysis. Hyponatremia. Followed by nephrology.   Anemia of end-stage renal  disease. Thrombocytopenia. Iron normal, B12 level 222.  Started on B12 supplement as a patient goal of B12 is 400 due to age.  Homocystine level still pending.   Type 2 diabetes. Continue sliding scale insulin .   Paroxysmal atrial fibrillation. Discussed with Dr. Tanda, anticoag can be restarted today.        Subjective:  Patient doing well today, has some pain he in the right foot.  No short of breath or cough.  No nausea vomiting.  Physical Exam: Vitals:   02/21/24 0500 02/21/24 0505 02/21/24 0814 02/21/24 1105  BP:  119/86 (!) 142/106   Pulse:  81 70   Resp:  18 16   Temp:  97.7 F (36.5 C) 97.9 F (36.6 C)   TempSrc:      SpO2:  100% 94%   Weight: 96.4 kg   89.6 kg  Height:       General exam: Appears calm and comfortable  Respiratory system: Clear to auscultation. Respiratory effort normal. Cardiovascular system: S1 & S2 heard, RRR. No JVD, murmurs, rubs, gallops or clicks. No pedal edema. Gastrointestinal system: Abdomen is nondistended, soft and nontender. No organomegaly or masses felt. Normal bowel sounds heard. Central nervous system: Alert and oriented. No focal neurological deficits. Extremities: Symmetric 5 x 5 power. Skin: No rashes, lesions or ulcers Psychiatry: Judgement and insight appear normal. Mood & affect appropriate.    Data Reviewed:  Lab results reviewed.  Family Communication: None  Disposition: Status is: Inpatient Remains inpatient appropriate because: Severity of disease, IV treatment.     Time spent: 35 minutes  Author: Murvin Mana, MD 02/21/2024 12:11 PM  For on call review [email or web address].

## 2024-02-21 NOTE — Progress Notes (Addendum)
 PD pt at [facility]. Navigator following to assist with any HD needs.  Suzen Satchel Dialysis Navigator [phone]

## 2024-02-21 NOTE — Plan of Care (Signed)
   Problem: Education: Goal: Knowledge of General Education information will improve Description Including pain rating scale, medication(s)/side effects and non-pharmacologic comfort measures Outcome: Progressing   Problem: Health Behavior/Discharge Planning: Goal: Ability to manage health-related needs will improve Outcome: Progressing

## 2024-02-21 NOTE — Progress Notes (Signed)
 [facility]  PROGRESS NOTE   Subjective:   Patient seen sitting at bedside,eating breakfast Alert and oriented, wife at bedside Refused PD treatment overnight due to numerous alarms during treatment the previous night.  Room air Denies pain  Objective:  Vital signs: Blood pressure (!) 142/106, pulse 70, temperature 97.9 F (36.6 C), resp. rate 16, height 6' (1.829 m), weight 89.6 kg, SpO2 94%.  Intake/Output Summary (Last 24 hours) at 02/21/2024 1115 Last data filed at 02/21/2024 0121 Gross per 24 hour  Intake 312.22 ml  Output 400 ml  Net -87.78 ml   Filed Weights   02/20/24 0500 02/21/24 0500 02/21/24 1105  Weight: 86 kg 96.4 kg 89.6 kg     Physical Exam: General:  No acute distress  Head:  Normocephalic, atraumatic. Moist oral mucosal membranes  Eyes:  Anicteric  Lungs:   Clear to auscultation, normal effort  Heart:  S1S2 no rubs  Abdomen:   Soft, nontender, bowel sounds present  Extremities:  No peripheral edema.  Neurologic:  Awake, alert, following commands  Skin:  No lesions, Rt foot surgical dressing  Access: [facility]    Basic Metabolic Panel: Recent Labs  Lab 02/18/24 1525 02/19/24 0539  NA 131* 131*  K 4.9 4.2  CL 90* 96*  CO2 22 22  GLUCOSE 220* 114*  BUN 48* 50*  CREATININE 10.10* 10.40*  CALCIUM  9.7 9.0  MG 3.4*  --    GFR: Estimated Creatinine Clearance: 6.4 mL/min (A) (by C-G formula based on SCr of 10.4 mg/dL (H)).  Liver Function Tests: Recent Labs  Lab 02/18/24 1525  AST 25  ALT 21  ALKPHOS 86  BILITOT 0.3  PROT 6.0*  ALBUMIN 2.9*   No results for input(s): LIPASE, AMYLASE in the last 168 hours. No results for input(s): AMMONIA in the last 168 hours.  CBC: Recent Labs  Lab 02/18/24 1525 02/19/24 0538 02/20/24 0456 02/21/24 0322  WBC 14.8* 11.4* 11.0* 11.8*  NEUTROABS 11.0*  --   --   --   HGB 10.4* 8.4* 9.0* 9.2*  HCT 31.4* 26.1* 27.5* 27.9*  MCV 94.0 94.9 94.8 95.2  PLT 154 108* 118* 128*      HbA1C: Hgb A1c MFr Bld  Date/Time Value Ref Range Status  01/05/2023 10:24 PM 7.5 (H) 4.8 - 5.6 % Final    Comment:    (NOTE) Pre diabetes:          5.7%-6.4%  Diabetes:              >6.4%  Glycemic control for   <7.0% adults with diabetes   01/18/2019 02:30 PM 5.9 (H) 4.8 - 5.6 % Final    Comment:    (NOTE) Pre diabetes:          5.7%-6.4% Diabetes:              >6.4% Glycemic control for   <7.0% adults with diabetes     Urinalysis: Recent Labs    02/20/24 0130  COLORURINE YELLOW*  LABSPEC 1.011  PHURINE 6.0  GLUCOSEU 150*  HGBUR NEGATIVE  BILIRUBINUR NEGATIVE  KETONESUR NEGATIVE  PROTEINUR 100*  NITRITE NEGATIVE  LEUKOCYTESUR NEGATIVE      Imaging: DG Foot Complete Right Result Date: 02/20/2024 EXAM: 3 OR MORE VIEW(S) XRAY OF THE RIGHT FOOT 02/20/2024 05:23:00 PM COMPARISON: None available. CLINICAL HISTORY: [id number] Postop check [id number] Postop check [id number] FINDINGS: BONES AND JOINTS: Status post first digit distal and partial proximal phalangeal amputation. Limited evaluation of the amputation on  the frontal view due to overlying soft tissues and vasculature. No findings of cortical erosion or destruction. Plantar and posterior calcaneal spurs. No acute fracture. No joint dislocation. SOFT TISSUES: Associated subcutaneous soft tissue edema and emphysema likely due to postsurgical changes. Severe vascular calcifications. IMPRESSION: 1. Status post first digit distal and partial proximal phalangeal amputation with associated subcutaneous soft tissue edema and emphysema, likely postsurgical changes. 2. Limited evaluation of the amputation on the frontal view due to overlying soft tissues and vasculature. Electronically signed by: Morgane Naveau MD 02/20/2024 06:13 PM EST RP Workstation: [id number]     Medications:    dialysis solution 1.5% low-MG/low-CA     piperacillin-tazobactam (ZOSYN)  IV 2.25 g (02/21/24 0651)    atorvastatin   40 mg Oral q1800    carvedilol   6.25 mg Oral BID WC   vitamin B-12  1,000 mcg Oral Daily   gentamicin  cream  1 Application Topical Daily   pantoprazole   40 mg Oral Daily   sodium chloride  flush  3 mL Intravenous Q12H   torsemide   10 mg Oral BID   vancomycin  variable dose per unstable renal function (pharmacist dosing)   Does not apply See admin instructions    Assessment/ Plan:     78 y.o. male with a PMHx of diabetes, vascular disease, hypertension, atrial fibrillation, end-stage renal disease on peritoneal dialysis now comes in with history of worsening right foot gangrene and redness.  He was given a dose of vancomycin  and also was started on Zosyn.  Patient was evaluated by vascular and podiatry and the plan is to have amputation done.  Patient would like to continue peritoneal dialysis treatments while in the hospital.     Principal Problem:   Cellulitis of right foot Active Problems:   Type 2 diabetes mellitus with chronic kidney disease on chronic dialysis, without long-term current use of insulin  (HCC)   Paroxysmal atrial fibrillation (HCC)   ESRD needing dialysis (HCC)   Atherosclerosis of native arteries of the extremities with ulceration (HCC)   PAD (peripheral artery disease)   Hyponatremia   Thrombocytopenia   #1: End-stage renal disease on peritoneal dialysis: Refused PD treatment overnight due to multiple alarms previous night. Wife suggested daytime treatment however patient refused. Patient has had issues with PD treatments during previous admissions. Will monitor d/c plan closely. If d/c today or tomorrow, will allow patient to rest tonight.    #2: Cellulitis of the right foot: Patient is being evaluated by vascular and podiatry.  Will continue vancomycin  and Zosyn as ordered.  Foot MRI is suspicious for osteomyelitis. 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   #4: Secondary hyperparathyroidism: No recent labs.  Will collect in am    LOS: 3 [facility] 11/17/202511:15 AM

## 2024-02-22 ENCOUNTER — Other Ambulatory Visit: Payer: Self-pay

## 2024-02-22 DIAGNOSIS — E1122 Type 2 diabetes mellitus with diabetic chronic kidney disease: Secondary | ICD-10-CM | POA: Diagnosis not present

## 2024-02-22 DIAGNOSIS — L03115 Cellulitis of right lower limb: Secondary | ICD-10-CM | POA: Diagnosis not present

## 2024-02-22 DIAGNOSIS — Z992 Dependence on renal dialysis: Secondary | ICD-10-CM | POA: Diagnosis not present

## 2024-02-22 DIAGNOSIS — N186 End stage renal disease: Secondary | ICD-10-CM | POA: Diagnosis not present

## 2024-02-22 LAB — CBC
HCT: 27.5 % — ABNORMAL LOW (ref 39.0–52.0)
Hemoglobin: 9 g/dL — ABNORMAL LOW (ref 13.0–17.0)
MCH: 30.6 pg (ref 26.0–34.0)
MCHC: 32.7 g/dL (ref 30.0–36.0)
MCV: 93.5 fL (ref 80.0–100.0)
Platelets: 140 K/uL — ABNORMAL LOW (ref 150–400)
RBC: 2.94 MIL/uL — ABNORMAL LOW (ref 4.22–5.81)
RDW: 14.1 % (ref 11.5–15.5)
WBC: 10.1 K/uL (ref 4.0–10.5)
nRBC: 0 % (ref 0.0–0.2)

## 2024-02-22 LAB — RENAL FUNCTION PANEL
Albumin: 2.6 g/dL — ABNORMAL LOW (ref 3.5–5.0)
Anion gap: 18 — ABNORMAL HIGH (ref 5–15)
BUN: 54 mg/dL — ABNORMAL HIGH (ref 8–23)
CO2: 20 mmol/L — ABNORMAL LOW (ref 22–32)
Calcium: 9 mg/dL (ref 8.9–10.3)
Chloride: 91 mmol/L — ABNORMAL LOW (ref 98–111)
Creatinine, Ser: 10.4 mg/dL — ABNORMAL HIGH (ref 0.61–1.24)
GFR, Estimated: 5 mL/min — ABNORMAL LOW (ref >60)
Glucose, Bld: 142 mg/dL — ABNORMAL HIGH (ref 70–99)
Phosphorus: 7.6 mg/dL — ABNORMAL HIGH (ref 2.5–4.6)
Potassium: 4.5 mmol/L (ref 3.5–5.1)
Sodium: 128 mmol/L — ABNORMAL LOW (ref 135–145)

## 2024-02-22 LAB — VANCOMYCIN, RANDOM: Vancomycin Rm: 24 ug/mL

## 2024-02-22 LAB — GLUCOSE, CAPILLARY: Glucose-Capillary: 134 mg/dL — ABNORMAL HIGH (ref 70–99)

## 2024-02-22 LAB — SURGICAL PATHOLOGY

## 2024-02-22 MED ORDER — AMOXICILLIN 500 MG PO CAPS
500.0000 mg | ORAL_CAPSULE | Freq: Two times a day (BID) | ORAL | 0 refills | Status: AC
  Filled 2024-02-22: qty 14, 7d supply, fill #0

## 2024-02-22 MED ORDER — ALUM & MAG HYDROXIDE-SIMETH 200-200-20 MG/5ML PO SUSP
30.0000 mL | Freq: Four times a day (QID) | ORAL | Status: DC | PRN
  Administered 2024-02-22: 30 mL via ORAL
  Filled 2024-02-22: qty 30

## 2024-02-22 MED ORDER — TAMSULOSIN HCL 0.4 MG PO CAPS
0.4000 mg | ORAL_CAPSULE | Freq: Every day | ORAL | 0 refills | Status: AC
  Filled 2024-02-22: qty 14, 14d supply, fill #0

## 2024-02-22 MED ORDER — CIPROFLOXACIN HCL 500 MG PO TABS
500.0000 mg | ORAL_TABLET | Freq: Every day | ORAL | 0 refills | Status: AC
  Filled 2024-02-22: qty 7, 7d supply, fill #0

## 2024-02-22 MED ORDER — CYANOCOBALAMIN 1000 MCG PO TABS
1000.0000 ug | ORAL_TABLET | Freq: Every day | ORAL | 0 refills | Status: AC
  Filled 2024-02-22: qty 14, 14d supply, fill #0

## 2024-02-22 NOTE — Plan of Care (Signed)
  Problem: Skin Integrity: Goal: Risk for impaired skin integrity will decrease Outcome: Progressing   Problem: Safety: Goal: Ability to remain free from injury will improve Outcome: Progressing   Problem: Elimination: Goal: Will not experience complications related to bowel motility Outcome: Progressing   Problem: Nutrition: Goal: Adequate nutrition will be maintained Outcome: Progressing   

## 2024-02-22 NOTE — Progress Notes (Signed)
 D/C order noted.  Contacted [facility] advised of PD pt d/c. Requested documents faxed to clinic for continuation of care.  Suzen Satchel Dialysis Navigator [phone]

## 2024-02-22 NOTE — Plan of Care (Signed)

## 2024-02-22 NOTE — Discharge Summary (Signed)
 Physician Discharge Summary   Patient: Tyler Wiley MRN: [id number] DOB: June 04, 1945  Admit date:     02/18/2024  Discharge date: 02/22/24  Discharge Physician: Murvin Mana   PCP: Lenon Layman ORN, MD   Recommendations at discharge:   Follow-up with PCP in 1 week. Follow-up with podiatry in 1 week. Podiatry to follow-up on the final wound culture results. Continue peritoneal dialysis at home.  Discharge Diagnoses: Principal Problem:   Cellulitis of right foot Active Problems:   Paroxysmal atrial fibrillation (HCC)   ESRD needing dialysis (HCC)   Type 2 diabetes mellitus with chronic kidney disease on chronic dialysis, without long-term current use of insulin  (HCC)   Atherosclerosis of native arteries of the extremities with ulceration (HCC)   PAD (peripheral artery disease)   Hyponatremia   Thrombocytopenia  Resolved Problems:   * No resolved hospital problems. *  Hospital Course: Tyler Wiley is a 78 y.o. year old male with medical history of HTN, HLD, T2DM, ESRD on PD presenting to ED with worsening right foot redness.  He has dry gangrene in the toes of bilateral foot, had a cellulitis of right foot. He is treated with vancomycin  and Zosyn.  Had a first toe ray amputation 11/16.  Also seen by vascular surgery, believes he has small vessel disease, no vascular intervention is planned. Condition has improved, medically stable for discharge.  Assessment and Plan: Right foot cellulitis. Bilateral toes gangrene. Peripheral vascular disease. Right foot x-ray showed soft tissue irregularity along the distal tip of the great toe with subcutaneous gas, worrisome for ulceration or gas-producing infection. Patient has significant lactic acidosis, but overall does not meet sepsis criteria. Per vascular surgery, no plan for intervention, believe gangrene was caused by small vessel disease. Discussed with podiatry, had right great toe ray amputation 11/16.  Wound culture grew  gram-negative rods. Podiatry has cleared patient for discharge, will continue antibiotics with Cipro and Augmentin.  Podiatry will follow-up on the final culture results.     Lactic acidosis. Patient had a severe lactic acidosis, appeared to be better after fluids. Patient does not meet sepsis criteria, the likely source is tissue ischemia from foot gangrene.   End-stage renal disease on peritoneal dialysis. Hyponatremia. Metabolic acidosis. Continue peritoneal dialysis at home.   Anemia of end-stage renal disease. Thrombocytopenia. Iron normal, B12 level 222.  Started on B12 supplement as a patient goal of B12 is 400 due to age.  Homocystine level was normal, will prescribe 14 days of B12.   Type 2 diabetes. Follow-up with PCP to adjust treatment option.   Paroxysmal atrial fibrillation. Continue anticoagulation        Consultants: Podiatry Procedures performed:  toe ray amputation. Disposition: Home Diet recommendation:  Discharge Diet Orders (From admission, onward)     Start     Ordered   02/22/24 0000  Diet general       Comments: Regular diet   02/22/24 0943           Regular diet DISCHARGE MEDICATION: Allergies as of 02/22/2024       Reactions   Amlodipine  Swelling   Cefuroxime    Other: Mouth ulcers, tongue swollen.  Tolerated Ancef .   Shrimp Extract    Sometimes breaks out in hives        Medication List     STOP taking these medications    clotrimazole-betamethasone cream Commonly known as: LOTRISONE   glimepiride  2 MG tablet Commonly known as: AMARYL    mupirocin  ointment 2 %  Commonly known as: BACTROBAN    SULFAMETHOXAZOLE -TMP DS PO       TAKE these medications    amoxicillin 500 MG capsule Commonly known as: AMOXIL Take 1 capsule (500 mg total) by mouth in the morning and at bedtime for 7 days.   apixaban  5 MG Tabs tablet Commonly known as: ELIQUIS  Take 1 tablet (5 mg total) by mouth 2 (two) times daily.   atorvastatin   40 MG tablet Commonly known as: LIPITOR Take 1 tablet (40 mg total) by mouth daily at 6 PM.   carvedilol  6.25 MG tablet Commonly known as: COREG  Take 6.25 mg by mouth 2 (two) times daily with a meal.   ciprofloxacin 500 MG tablet Commonly known as: Cipro Take 1 tablet (500 mg total) by mouth daily with breakfast for 7 days.   cyanocobalamin 1000 MCG tablet Take 1 tablet (1,000 mcg total) by mouth daily for 14 days. Start taking on: February 23, 2024   fluticasone  50 MCG/ACT nasal spray Commonly known as: Flonase  Place 2 sprays into both nostrils daily as needed for allergies. Home med.   metoCLOPramide 5 MG tablet Commonly known as: REGLAN Take 5 mg by mouth every 8 (eight) hours as needed.   ondansetron  8 MG disintegrating tablet Commonly known as: ZOFRAN -ODT Take 1-2 tablets (8-16 mg total) by mouth every 8 (eight) hours as needed for Nausea for up to 7 days   oxyCODONE  5 MG immediate release tablet Commonly known as: Oxy IR/ROXICODONE  Take 5-10 mg by mouth every 4 (four) hours as needed for severe pain (pain score 7-10).   pantoprazole  40 MG tablet Commonly known as: PROTONIX  Take 40 mg by mouth daily.   tamsulosin  0.4 MG Caps capsule Commonly known as: FLOMAX  Take 1 capsule (0.4 mg total) by mouth daily after supper for 14 days.   torsemide  10 MG tablet Commonly known as: DEMADEX  Take 10 mg by mouth 2 (two) times daily.               Discharge Care Instructions  (From admission, onward)           Start     Ordered   02/22/24 0000  Discharge wound care:       Comments: Clean skin near exit site with chloraprep swab sticks.  Starting at catheter, use circular pattern around exit site, moving towards outer edges of area covered by dressing.  Apply gentamicin  cream to site once daily.  Cover with dry dressing.   02/22/24 0943            Follow-up Information     Tanda Greig MATSU, DPM Follow up in 1 week(s).   Specialty: Podiatry Contact  information: [address] [place] [address] [phone]         Lenon Layman ORN, MD Follow up in 1 week(s).   Specialty: Internal Medicine Contact information: [address] [facility] [place] [address] [phone]                Discharge Exam: Fredricka Weights   02/21/24 0500 02/21/24 1105 02/22/24 0500  Weight: 96.4 kg 89.6 kg 88.5 kg   General exam: Appears calm and comfortable  Respiratory system: Clear to auscultation. Respiratory effort normal. Cardiovascular system: S1 & S2 heard, RRR. No JVD, murmurs, rubs, gallops or clicks. No pedal edema. Gastrointestinal system: Abdomen is nondistended, soft and nontender. No organomegaly or masses felt. Normal bowel sounds heard. Central nervous system: Alert and oriented. No focal neurological deficits.  Extremities: Symmetric 5 x 5 power. Skin: No rashes, lesions or ulcers Psychiatry: Judgement and insight appear normal. Mood & affect appropriate.    Condition at discharge: good  The results of significant diagnostics from this hospitalization (including imaging, microbiology, ancillary and laboratory) are listed below for reference.   Imaging Studies: DG Foot Complete Right Result Date: 02/20/2024 EXAM: 3 OR MORE VIEW(S) XRAY OF THE RIGHT FOOT 02/20/2024 05:23:00 PM COMPARISON: None available. CLINICAL HISTORY: [id number] Postop check [id number] Postop check [id number] FINDINGS: BONES AND JOINTS: Status post first digit distal and partial proximal phalangeal amputation. Limited evaluation of the amputation on the frontal view due to overlying soft tissues and vasculature. No findings of cortical erosion or destruction. Plantar and posterior calcaneal spurs. No acute fracture. No joint dislocation. SOFT TISSUES: Associated subcutaneous soft tissue edema and emphysema likely due to postsurgical changes. Severe vascular calcifications. IMPRESSION: 1. Status post first digit distal and partial  proximal phalangeal amputation with associated subcutaneous soft tissue edema and emphysema, likely postsurgical changes. 2. Limited evaluation of the amputation on the frontal view due to overlying soft tissues and vasculature. Electronically signed by: Morgane Naveau MD 02/20/2024 06:13 PM EST RP Workstation: [id number]   MR FOOT RIGHT WO CONTRAST Result Date: 02/19/2024 CLINICAL DATA:  Soft tissue infection suspected. History of diabetes and end-stage renal disease. EXAM: MRI OF THE RIGHT FOREFOOT WITHOUT CONTRAST TECHNIQUE: Multiplanar, multisequence MR imaging of the right forefoot was performed. No intravenous contrast was administered. COMPARISON:  Radiographs of the great toe 02/18/2024. FINDINGS: Bones/Joint/Cartilage There is limited residual soft tissue in the distal great toe with apparent osseous exposure of the distal phalanx. There is heterogeneous T2 hyperintensity and T1 hypointensity within the distal 1st phalanx, suspicious for osteomyelitis or osteonecrosis. The proximal phalanx appears intact. No significant interphalangeal joint effusion. Small amount of fluid within the 1st metatarsophalangeal joint, nonspecific. The additional toes and metatarsals appear intact. The visualized bones of the midfoot appear intact. Ligaments Intact Lisfranc ligament. The collateral ligaments of the metatarsophalangeal joints appear intact. Muscles and Tendons Generalized atrophy and T2 hyperintensity within the forefoot musculature. Trace fluid within the flexor digitorum longus tendon sheaths. The extensor tendons appear unremarkable. Soft tissues As above, attenuation of the soft tissues around the distal aspect of the great toe with possible osseous exposure. Prominent vascular calcifications and possible subcutaneous emphysema on recent radiographs. Generalized mild dorsal subcutaneous edema. No organized fluid collection identified. IMPRESSION: 1. Attenuation of the soft tissues around the distal aspect  of the great toe with possible osseous exposure of the distal phalanx. 2. Heterogeneous T2 hyperintensity and T1 hypointensity within the distal 1st phalanx, suspicious for osteomyelitis or osteonecrosis. 3. No evidence of soft tissue abscess. 4. 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 Electronically Signed   By: Elsie Perone M.D.   On: 02/19/2024 10:35   DG Toe Great Right Result Date: 02/18/2024 EXAM: VIEW(S) XRAY OF THE RIGHT TOES 02/18/2024 05:35:17 PM COMPARISON: None available. CLINICAL HISTORY: eval osteo. dry gangrene chronically, acute cellulitis streaking up foot FINDINGS: BONES AND JOINTS: Diffuse osteopenia. No radiographic findings of osteomyelitis, at this time. No acute fracture. No focal osseous lesion. No joint dislocation. SOFT TISSUES: Soft tissue irregularity along the distal tip of the great toe with subcutaneous gas. Extensive peripheral vascular atherosclerosis. IMPRESSION: 1. Soft tissue irregularity along the distal tip of the great toe with subcutaneous gas, worrisome for ulceration or gas-producing infection. No radiographic findings of osteomyelitis,. at this time.  Electronically signed by: Rogelia Myers MD 02/18/2024 05:38 PM EST RP Workstation: [id number]    Microbiology: Results for orders placed or performed during the hospital encounter of 02/18/24  Blood culture (routine x 2)     Status: None (Preliminary result)   Collection Time: 02/18/24  4:05 PM   Specimen: BLOOD  Result Value Ref Range Status   Specimen Description BLOOD BLOOD RIGHT ARM  Final   Special Requests   Final    BOTTLES DRAWN AEROBIC AND ANAEROBIC Blood Culture results may not be optimal due to an inadequate volume of blood received in culture bottles   Culture   Final    NO GROWTH 4 DAYS Performed at [facility], [address] [place], [place] [address]    Report Status PENDING  Incomplete  Blood culture  (routine x 2)     Status: None (Preliminary result)   Collection Time: 02/18/24  8:46 PM   Specimen: BLOOD  Result Value Ref Range Status   Specimen Description BLOOD BLOOD RIGHT ARM  Final   Special Requests   Final    BOTTLES DRAWN AEROBIC AND ANAEROBIC Blood Culture adequate volume   Culture   Final    NO GROWTH 4 DAYS Performed at [facility], [address] [place], [place] [address]    Report Status PENDING  Incomplete  Aerobic/Anaerobic Culture w Gram Stain (surgical/deep wound)     Status: None (Preliminary result)   Collection Time: 02/20/24  5:00 PM   Specimen: Foot, Right; Wound  Result Value Ref Range Status   Specimen Description   Final    WOUND Performed at [facility], [address] [place], [place] [address]    Special Requests   Final    RF Performed at [facility], [address] [place], [place] [address]    Gram Stain NO WBC SEEN NO ORGANISMS SEEN   Final   Culture   Final    FEW GRAM NEGATIVE RODS SUSCEPTIBILITIES TO FOLLOW Performed at [facility], [address] [place], [place] [address]    Report Status PENDING  Incomplete    Labs: CBC: Recent Labs  Lab 02/18/24 1525 02/19/24 0538 02/20/24 0456 02/21/24 0322 02/22/24 0521  WBC 14.8* 11.4* 11.0* 11.8* 10.1  NEUTROABS 11.0*  --   --   --   --   HGB 10.4* 8.4* 9.0* 9.2* 9.0*  HCT 31.4* 26.1* 27.5* 27.9* 27.5*  MCV 94.0 94.9 94.8 95.2 93.5  PLT 154 108* 118* 128* 140*   Basic Metabolic Panel: Recent Labs  Lab 02/18/24 1525 02/19/24 0539 02/22/24 0903  NA 131* 131* 128*  K 4.9 4.2 4.5  CL 90* 96* 91*  CO2 22 22 20*  GLUCOSE 220* 114* 142*  BUN 48* 50* 54*  CREATININE 10.10* 10.40* 10.40*  CALCIUM  9.7 9.0 9.0  MG 3.4*  --   --   PHOS  --   --  7.6*   Liver Function Tests: Recent Labs  Lab 02/18/24 1525 02/22/24 0903  AST 25  --   ALT 21  --   ALKPHOS 86  --   BILITOT 0.3  --   PROT 6.0*  --   ALBUMIN 2.9* 2.6*    CBG: Recent Labs  Lab 02/21/24 0814 02/21/24 1151 02/21/24 1645 02/21/24 2152 02/22/24 0749  GLUCAP 212* 202* 191* 197* 134*    Discharge time spent: 35 minutes.  Signed: Murvin Mana, MD [facility] 02/22/2024

## 2024-02-22 NOTE — Op Note (Addendum)
 FOOT AND ANKLE SURGERY  OPERATIVE REPORT  DOS: 02/20/2024 SURGEON: Jalee Saine G. Ilena Dieckman, DPM  PRE-OPERATIVE DIAGNOSIS:  Gas gangrene, right foot Acute osteomyelitis, right foot  POST-OPERATIVE DIAGNOSIS: same  PROCEDURE(S): Partial hallux amputation, right foot  HEMOSTASIS: None  ANESTHESIA: general + local block  ESTIMATED BLOOD LOSS: 10cc  FINDING(S): 1. Necrotic distal aspect of hallux with soft bone.  2. Small necrosis of tissue noted within the flexor hallucis longus sheath - debrided without rediual necrosis noted to tissue.   PATHOLOGY/SPECIMEN(S):   ID Type Source Tests Collected by Time Destination  1 : Right Hallux Distal Margin Tissue Foot, Right SURGICAL PATHOLOGY Tanda Greig MATSU, DPM 02/20/2024 1551   2 : Right Hallux Proximal Margin Tissue Foot, Right SURGICAL PATHOLOGY Tanda Greig MATSU, DPM 02/20/2024 1624   A : Deep Wound Swab Right Foot Wound Foot, Right AEROBIC/ANAEROBIC CULTURE W GRAM STAIN (SURGICAL/DEEP WOUND) Tanda Greig MATSU, DPM 02/20/2024 1620     PROCEDURE INDICATIONS:  Tyler Wiley is a 78 y.o. male with a past medical history significant for ESRD (on PD daily except Saturdays), prior CVA (2020), extensive PAD, HTN, BPH s/p urolift (2020), CAD, T2DM (Aic 7.5% 01/2024), HLD, s/p cholecystectomy, anemia of CKD, afib (on indefinite apixaban ) that present to the emergency department meeting sepsis criteria with suspected source of acute gangrenous toe on the right hallux. Despite conservative management and local wound care, wound persisted and progressed with acute worsening and systemic changes over the prior two days. With clinical evidence of poor healing capacity given wound extent and infection source both medical and surgical treatment options were presented to the patient, of which the patient elected to undergo surgical intervention of via amputation with plans for primary closure. The procedure, alternatives, risks, and limitations in this individual case have  been carefully discussed with the patient. All questions have been thoroughly answered and the patient understands the surgery indicated. The patient has requested that this surgical intervention be undertaken and a consent form was signed.   PROCEDURAL DETAILS:  The patient was brought to the operating room and was transitioned to the operating room table in the supine position. A standard awake time-out was conducted to confirm the correct patient, procedure, side, and site. All team members were in agreement. Anesthesia team initiated general anesthesia administered and antibiotics from the inpatient floor were confirmed.  The operative extremity was prepped with alcohol, and 10ml plain local anesthetic as noted above was administered for local mayo  block. An ankle tourniquet was applied and set to but was ultimately not inflated. The extremity was then scrubbed, prepped and draped in the usual aseptic manner.   A full-thickness, modified fishmouth incision was made encircling the hallux was made with care taken to incise just proximal to level of necrotic tissue. This incision was carried directly down to bone. There was noted to be gangrenous changes to the distal hallux with necrotic / soft bone at the distal phalanx. A small area of necrotic tissue within the flexor tendon sheath  was noted and debrided via rongeur. This tissue was collected for culture and sensitivities. The distal digit was distracted and sharply disarticulated from the proximal phalanx at the level of the hallux interphalangeal joint. This most distal aspect of bone with suspected infection was passed to the back table to be labeled and sent for pathology evaluation as a distal margin. Blunt exploration revealed no tracking, pockets of fluid, loculations or active infection. All nonviable necrotic tissue was sharply excised from the  surgical site. Nonfunctioning tendons associated with the amputated digit were then tensioned  and transected at most proximal point visualized. The associated proximal phalanx was inspected and noted to be of healthy, viable quality; absent of gross infection. Unfortunately, given the level of necrosis, soft tissue coverage in this area was poor and the proximal phalanx required resection in order to have adequate flap coverage / closure; therefore a sagittal saw was used to resect the distal aspect of the proximal phalanx.  The associated proximal potion of bone specimen which was noted to be absent of gross infection was identified and collected to be sent for pathology evaluation as a proximal margin.   The remaining soft tissue and bone appeared to be of healthy, viable quality; absent of gross infection. The surgical site was then irrigated with copious amounts of normal saline via bulb syringe. The surgical site was then closed using 3-0 Nylon. Though perfusion to the flap coverage was noted, overall bleeding intraoperatively was noted to be very minimal. A dry sterile dressing was applied consisting of Betadine soaked Adaptic, 4 x 4 gauze, Kerlix and a loosely placed ACE bandage.   Overall, the patient tolerated the procedure and anesthesia well. They were transported to the recovery room with vital signs stable and vascular status intact to the operative foot. Once the patient receives is deemed appropriate by the recovery room staff, the patient will be transferred back to the floor to resume inpatient care.   POST-OPERATIVE PLAN:  Diet: May advance as tolerated to baseline diet.  Activity: Non-weight bearing on the operative extremity for transfer. Limit excessive ambulation and dependency to the operative extremity. ABX: ABX tailoring and course pending wound culture and specimen evaluation (collected intraoperatively).

## 2024-02-22 NOTE — Progress Notes (Signed)
 Patient has been discharged to home with wife at bedside. He is alert and oriented X4. He transferred from bed to wheelchair with assistance. IV's to right and  left arms have been removed . Both catheter and tip are intact. Both areas covered with gauze and tape. Pressure applied to areas to ensure bleeding has stopped. Meds to bed have been delivered. Discharge instructions have been explained and acknowledged understanding. Staff to transport patient to lobby for pick up.

## 2024-02-22 NOTE — Progress Notes (Signed)
 PODIATRY: PROGRESS NOTE    Surgery:  Procedure(s) (LRB): AMPUTATION, FOOT, PARTIAL (Right) POD:  1  O/N: NAEON  Subjective:  Patient resting comfortably at bedside.  Relays he had to walk to use the restroom s/p anesthesia.  + WB on foot prior to PT/OT clearance.  Reports he is feeling much improved.  Did have episode of blood strikethrough on bandage overnight.  Denies F/C/N/V/SOB/CP. Denies acute calf pain.    PHYSICAL EXAMINATION: BP 119/65 (BP Location: Right Arm)   Pulse (!) 56   Temp 97.7 F (36.5 C)   Resp 17   Ht 6' (1.829 m)   Wt 89.6 kg   SpO2 99%   BMI 26.79 kg/m ? GEN: NAD. AOX3. ? RESP: Non-labored breathing on RA.? ABD: NT/ND of all four quadrants.? NEURO: Moving all four extremities spontaneously. ? ? FOCUSED LOWER EXTREMITY EXAMINATION:?   Neurological:  - Gross protective sensation diminished bilaterally.  - No focal motor or sensory deficits identified bilaterally.    Vascular:  - Dorsalis Pedis artery on palpation: nonpalpable LLE ; 1  on the RLE - Posterior Tibial artery on palpation: nonpalpable BLE - Capillary Filling Times: sluggish - Peripheral edema: localized to the RLE - forefoot     Musculoskeletal:  Muscle strength: 5/5 in all 4 quadrants  Ankle Joint ROM: decreased, equinus noted Global foot - TTP: mild to the R forefoot / hallux. - no crepitus.    Dermatological:  - Skin quality: atrophic - gangrenous as pictured.  - erythema to the midfoot -- R foot - distal necrosis to the hallux with plantar necrosis more extensively noted -stable from outline conducted - Improed/ resolved 02/21/24 - less erythema noted to the L foot - erythema associated primarily to the necrotic tissue (adjacent skin <1cm) - located to the dorsal distal hallux to level of HIPJ - distal 2nd digit both dorsal and plantar to level of HIPJ - stable eschar to the 2nd and 3rd MPJ level dorsally. ??      Results for orders placed or performed during the  hospital encounter of 02/18/24  Blood culture (routine x 2)     Status: None (Preliminary result)   Collection Time: 02/18/24  4:05 PM   Specimen: BLOOD  Result Value Ref Range Status   Specimen Description BLOOD BLOOD RIGHT ARM  Final   Special Requests   Final    BOTTLES DRAWN AEROBIC AND ANAEROBIC Blood Culture results may not be optimal due to an inadequate volume of blood received in culture bottles   Culture   Final    NO GROWTH 3 DAYS Performed at [facility], [address], [place], [place] [address]    Report Status PENDING  Incomplete  Blood culture (routine x 2)     Status: None (Preliminary result)   Collection Time: 02/18/24  8:46 PM   Specimen: BLOOD  Result Value Ref Range Status   Specimen Description BLOOD BLOOD RIGHT ARM  Final   Special Requests   Final    BOTTLES DRAWN AEROBIC AND ANAEROBIC Blood Culture adequate volume   Culture   Final    NO GROWTH 3 DAYS Performed at [facility], [address] [place], [place] [address]    Report Status PENDING  Incomplete  Aerobic/Anaerobic Culture w Gram Stain (surgical/deep wound)     Status: None (Preliminary result)   Collection Time: 02/20/24  5:00 PM   Specimen: Foot, Right; Wound  Result Value Ref Range Status   Specimen Description  Final    WOUND Performed at [facility], [address] [place], [place] [address]    Special Requests   Final    RF Performed at [facility], [address] [place], [place] [address]    Gram Stain NO WBC SEEN NO ORGANISMS SEEN   Final   Culture   Final    NO GROWTH < 12 HOURS Performed at [facility], [address], [place], [place] [address]    Report Status PENDING  Incomplete    ID Type Source Tests Collected by Time Destination  1 : Right Hallux Distal Margin Tissue Foot, Right SURGICAL PATHOLOGY Tanda Greig MATSU, DPM 02/20/2024 1551   2 : Right Hallux Proximal Margin Tissue Foot, Right SURGICAL PATHOLOGY Tanda Greig MATSU, DPM 02/20/2024 1624   A : Deep Wound Swab Right Foot Wound Foot, Right AEROBIC/ANAEROBIC CULTURE W GRAM STAIN (SURGICAL/DEEP WOUND) Tanda Greig MATSU, DPM 02/20/2024 1620      ASSESSMENT:?  Tyler Wiley is a 78 y.o. 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 Will need closer monitoring of L foot to ensure no acute infection occurs. Discussed likelihood of staged outpatient procedures to assist with necrotic digits. Likely once R hallux site healed, will require at minimum L 2nd digit partial versus total amputation. Patient aware.   PLAN:? - Activity: Non weight bearing to the right lower extremity in post operative shoe.  - Diet:resumed - per primary. - Wound Care: Podiatry to conduct post-op. Materials: Betadine / Adaptic / Gauze (4x4) / Kerlix / 4 inch ACE bandage Instructions: *Podiatry to conduct post-op. Supplement Reccs: Okay to reinforce with dry gauze / loose ACE overlap prn strikethrough. Please leave underlayer intact.  - ABX: Appreciate assistance with antibiotic stewardship from medicine / pharmacy / ID services.    - Dispo: Home with HH services (PT/OT/Wound care?) - patient already has this set up from prior comorbidity associated hospitalizations - solely need coordination on addition of services/ recommendations.   - Once patient is determined to be stable systemically okay to DC home with ABX coverage, close follow up (within 1 week) - may not need to await pathology unless determine a necessity by ID or medicine. Can follow up in office if needed.

## 2024-02-23 LAB — CULTURE, BLOOD (ROUTINE X 2)
Culture: NO GROWTH
Culture: NO GROWTH
Special Requests: ADEQUATE

## 2024-02-25 LAB — AEROBIC/ANAEROBIC CULTURE W GRAM STAIN (SURGICAL/DEEP WOUND): Gram Stain: NONE SEEN

## 2024-02-26 NOTE — Anesthesia Postprocedure Evaluation (Signed)
 Anesthesia Post Note  Patient: Tyler Wiley  Procedure(s) Performed: AMPUTATION, FOOT, PARTIAL (Right: Foot)  Patient location during evaluation: PACU Anesthesia Type: General Level of consciousness: awake and alert Pain management: pain level controlled Vital Signs Assessment: post-procedure vital signs reviewed and stable Respiratory status: spontaneous breathing, nonlabored ventilation, respiratory function stable and patient connected to nasal cannula oxygen Cardiovascular status: blood pressure returned to baseline and stable Postop Assessment: no apparent nausea or vomiting Anesthetic complications: no   No notable events documented.   Last Vitals:  Vitals:   02/22/24 0408 02/22/24 0748  BP: 138/60 124/67  Pulse: (!) 57 (!) 56  Resp: 17 17  Temp: (!) 36.4 C (!) 36.4 C  SpO2: 98% 100%    Last Pain:  Vitals:   02/22/24 1102  TempSrc:   PainSc: 0-No pain                 Prentice Murphy

## 2024-03-08 NOTE — Progress Notes (Signed)
 Tyler Wiley is a 78 y.o. male here for follow up of their medical problems  CHIEF COMPLAINT:  Follow up medical problems in the problem list and as discussed in the history and assessment areas as well as new complaints as listed.   Patient Active Problem List  Diagnosis  . Type 2 diabetes mellitus with chronic kidney disease on chronic dialysis, without long-term current use of insulin  (CMS/HHS-HCC)  . HTN (hypertension)  . Gastritis  . OSA (obstructive sleep apnea)  . Severe obesity (BMI 35.0-35.9 with comorbidity) (CMS-HCC)  . Health care maintenance  . Vitamin B12 deficiency  . Carotid artery disease  . Hx of completed stroke  . ESRD needing dialysis (CMS/HHS-HCC)  . History of gastritis  . Hyperkalemia  . PAF (paroxysmal atrial fibrillation) (CMS/HHS-HCC)  . Atherosclerotic PVD with ulceration (CMS/HHS-HCC)     HISTORY OF PRESENT ILLNESS:  Atherosclerotic PVD with ulceration (CMS/HHS-HCC) Post [facility] with another toe amputation. Bp has been lower at home, very dizzy when up now. Not eating much, in some pain but only taking 2 percocets a day at this point. Ultimately advised er with hypotension but he would not go, he is understanding he is very near end of life. He and his wife will dc the coreg  and torsemide . Advised a bottle of gatorade.   Past Medical History:  Diagnosis Date  . A-fib (CMS/HHS-HCC) 01/12/2023  . Abdominal hernia 2014 repaired  . Allergy seasonal  . Anemia dialysis related   epogen; iv iron  . Arrhythmia   . Atherosclerotic PVD with ulceration (CMS/HHS-HCC) 12/16/2023  . BPH (benign prostatic hypertrophy)   . Carotid artery disease   . Diabetes mellitus type 2, uncomplicated (CMS/HHS-HCC)   . GERD (gastroesophageal reflux disease) 2014   not sure  . Gout, joint   . History of cataract 2021  . History of stroke 07/2014  . Hypercholesteremia, intolerant statins and cholestyramine   . Hypertension   . 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  Obesity   . Osteoarthritis   . PUD (peptic ulcer disease)    nsaid related  . Seizure-like activity (CMS/HHS-HCC) 06/02/2018  . Sleep apnea    not sure  . Type 2 diabetes mellitus with stage 4 chronic kidney disease, without long-term current use of insulin  (CMS/HHS-HCC)     Past Surgical History:  Procedure Laterality Date  . COLONOSCOPY  11/17/2012  . AMPUTATION FINGER/THUMB Right 12/08/2023   Procedure: Right Ring Finger Amputation;  Surgeon: Janece Norman Standing, MD;  Location: [facility];  Service: Orthopedics;  Laterality: Right;  . INTRAOPERATIVE FLUOROSCOPY Right 12/08/2023   Procedure: FLUOROSCOPY (SEPARATE PROCEDURE), UP TO 1 HOUR PHYSICIAN OR OTHER QUALIFIED HEALTH CARE PROFESSIONAL TIME;  Surgeon: Janece Norman Standing, MD;  Location: [facility];  Service: Orthopedics;  Laterality: Right;  . CHOLECYSTECTOMY    . COLONOSCOPY  2015?  SABRA EGD  11/17/2012, 02/02/2013  . HERNIA REPAIR    . UPPER GASTROINTESTINAL ENDOSCOPY  2016?     No fever chills or sweats   No nausea, vomiting or diarrhea  No chest pain, shortness of breath   Social History   Socioeconomic History  . Marital status: Married  Tobacco Use  . Smoking status: Former    Current packs/day: 0.00    Types: Cigarettes  . Smokeless tobacco: Never  Vaping Use  . Vaping status: Never Used  Substance and Sexual Activity  . Alcohol use: Not Currently    Comment: occasionally  . Drug use: Never  . 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 Three daughters. Golfs.   Social Drivers of Corporate Investment Banker Strain: Low Risk  (02/16/2024)   Overall Financial Resource Strain (CARDIA)   . Difficulty of Paying Living Expenses: Not very hard  Food Insecurity: No Food Insecurity (02/19/2024)   [facility] from [facility]   Hunger Vital Sign   . Within the past 12 months, you worried that your food would run out before  you got the money to buy more.: Never true   . Within the past 12 months, the food you bought just didn't last and you didn't have money to get more.: Never true  Transportation Needs: No Transportation Needs (02/19/2024)   Received from [facility] - Transportation   . In the past 12 months, has lack of transportation kept you from medical appointments or from getting medications?: No   . In the past 12 months, has lack of transportation kept you from meetings, work, or from getting things needed for daily living?: No  Social Connections: Unknown (02/19/2024)   Received from [facility]   Social Connection and Isolation Panel   . In a typical week, how many times do you talk on the phone with family, friends, or neighbors?: More than three times a week   . How often do you get together with friends or relatives?: Never   . How often do you attend church or religious services?: More than 4 times per year   . Do you belong to any clubs or organizations such as church groups, unions, fraternal or athletic groups, or school groups?: Patient declined   . How often do you attend meetings of the clubs or organizations you belong to?: Patient declined   . Are you married, widowed, divorced, separated, never married, or living with a partner?: Married  Housing Stability: Low Risk  (02/16/2024)   Housing Stability Vital Sign   . Unable to Pay for Housing in the Last Year: No   . Number of Times Moved in the Last Year: 0   . Homeless in the Last Year: No      Current Outpatient Medications:  .  ACCU-CHEK GUIDE TEST STRIPS test strip, Use 1 each (1 strip total) 3 (three) times daily Use as instructed., Disp: 100 strip, Rfl: 0 .  atorvastatin  (LIPITOR) 40 MG tablet, TAKE 1 TABLET EVERY DAY, Disp: 90 tablet, Rfl: 3 .  blood glucose diagnostic (ONETOUCH ULTRA BLUE TEST STRIP) test strip, Use once daily Use as instructed., Disp: 100 each, Rfl: 3 .  blood glucose meter kit, Use as directed,  Disp: 1 each, Rfl: 0 .  blood glucose meter kit, as directed (and supplies for daily checks), Disp: 1 each, Rfl: 0 .  CALCITRIOL ORAL, Take by mouth (Patient not taking: Reported on 02/16/2024), Disp: , Rfl:  .  carvediloL  (COREG ) 6.25 MG tablet, Take 1 tablet (6.25 mg total) by mouth 2 (two) times daily with meals, Disp: 180 tablet, Rfl: 3 .  cholecalciferol (VITAMIN D3) 2,000 unit capsule, Take 2,000 Units by mouth once daily (Patient not taking: Reported on 02/16/2024), Disp: , Rfl:  .  cyanocobalamin  (VITAMIN B12) 1000 MCG tablet, Take 1,000 mcg by mouth, Disp: , Rfl:  .  fluticasone  propionate (FLONASE ) 50 mcg/actuation nasal spray, Place 2 sprays into both nostrils once daily (Patient not taking: Reported on 02/16/2024), Disp: 16 g, Rfl: 5 .  lancets, Use 1 each 3 (three) times daily Use as instructed., Disp: 100 each, Rfl: 12 .  metoclopramide (REGLAN) 5 MG tablet, Take 1 tablet (5 mg total) by mouth 4 (four) times daily (Patient not taking: Reported on 02/16/2024), Disp: 120 tablet, Rfl: 1 .  ondansetron  (ZOFRAN -ODT) 8 MG disintegrating tablet, Take 1 tablet (8 mg total) by mouth every 8 (eight) hours as needed for Nausea, Disp: 30 tablet, Rfl: 3 .  ONETOUCH VERIO TEST STRIPS test strip, Use 1 each (1 strip total) 3 (three) times daily Use as instructed., Disp: 100 strip, Rfl: 0 .  pantoprazole  (PROTONIX ) 40 MG DR tablet, Take 1 tablet (40 mg total) by mouth once daily, Disp: 90 tablet, Rfl: 3 .  TORsemide  (DEMADEX ) 10 MG tablet, Take 3 tablets (30 mg total) by mouth once daily, Disp: 270 tablet, Rfl: 3  Vitals:   03/08/24 1446  BP: (!) 88/50   Body mass index is 28.27 kg/m. No acute distress Lungs; clear to ascultation Heart; Regular rate and rhythm  Abdomen; Soft and flat, normal bowel sounds Extremities; No clubbing, cyanosis or edema  Admission on 12/07/2023, Discharged on 12/09/2023  Component Date Value Ref Range Status  . WBC (White Blood Cell Count) 12/07/2023 13.6 (H)  3.2  - 9.8 x10^9/L Final  . Hemoglobin 12/07/2023 9.6 (L)  13.7 - 17.3 g/dL Final  . Hematocrit 90/97/7974 30.0 (L)  39.0 - 49.0 % Final  . Platelets 12/07/2023 162  150 - 450 x10^9/L Final  . MCV (Mean Corpuscular Volume) 12/07/2023 96  80 - 98 fL Final  . MCH (Mean Corpuscular Hemoglobin) 12/07/2023 30.6  26.5 - 34.0 pg Final  . MCHC (Mean Corpuscular Hemoglobin * 12/07/2023 32.0  31.5 - 36.3 % Final  . RBC (Red Blood Cell Count) 12/07/2023 3.14 (L)  4.37 - 5.74 x10^12/L Final  . RDW-CV (Red Cell Distribution Widt* 12/07/2023 14.1  11.5 - 14.5 % Final  . NRBC (Nucleated Red Blood Cell Cou* 12/07/2023 0.00  0 x10^9/L Final  . NRBC % (Nucleated Red Blood Cell %) 12/07/2023 0.0  % Final  . MPV (Mean Platelet Volume) 12/07/2023 10.3  7.2 - 11.7 fL Final  . Neutrophil Count 12/07/2023 10.3 (H)  2.0 - 8.6 x10^9/L Final  . Neutrophil % 12/07/2023 75.9  37 - 80 % Final  . Lymphocyte Count 12/07/2023 1.6  0.6 - 4.2 x10^9/L Final  . Lymphocyte % 12/07/2023 11.5  10 - 50 % Final  . Monocyte Count 12/07/2023 1.2 (H)  0 - 0.9 x10^9/L Final  . Monocyte % 12/07/2023 8.5  0 - 12 % Final  . Eosinophil Count 12/07/2023 0.34  0 - 0.70 x10^9/L Final  . Eosinophil % 12/07/2023 2.5  0 - 7 % Final  . Basophil Count 12/07/2023 0.04  0 - 0.20 x10^9/L Final  . Basophil % 12/07/2023 0.3  0 - 2 % Final  . Immature Granulocyte Count 12/07/2023 0.17 (H)  <=0.06 x10^9/L Final  . Immature Granulocyte % 12/07/2023 1.3 (H)  <=0.7 % Final  . Sodium 12/07/2023 137  135 - 145 mmol/L Final  . Potassium 12/07/2023 4.3  3.5 - 5.0 mmol/L Final  . Chloride 12/07/2023 100  98 - 108 mmol/L Final  . Carbon Dioxide (CO2) 12/07/2023 22  21 - 30 mmol/L Final  . Urea Nitrogen (BUN) 12/07/2023 57 (H)  7 - 20 mg/dL Final  . Creatinine 90/97/7974 13.0 (H)  0.6 - 1.3 mg/dL Final  . Glucose 90/97/7974 161 (H)  70 - 140  mg/dL Final  . Calcium  12/07/2023 9.2  8.7 - 10.2 mg/dL Final  . AST (Aspartate Aminotransferase) 12/07/2023 16  15 - 41  U/L Final  . ALT (Alanine Aminotransferase) 12/07/2023 18  15 - 50 U/L Final  . Bilirubin, Total 12/07/2023 0.7  0.4 - 1.5 mg/dL Final  . Alk Phos (Alkaline Phosphatase) 12/07/2023 65  24 - 110 U/L Final  . Albumin 12/07/2023 2.3 (L)  3.5 - 4.8 g/dL Final  . Protein, Total 12/07/2023 5.8 (L)  6.2 - 8.1 g/dL Final  . Anion Gap 90/97/7974 15 (H)  3 - 12 mmol/L Final  . BUN/CREA Ratio 12/07/2023 4 (L)  6 - 27 Final  . Glomerular Filtration Rate (eGFR)  12/07/2023 4  mL/min/1.73sq m Final  . Act Partial Thromboplastin Time 12/07/2023 31.9  26.8 - 37.1 sec Final  . Prothrombin Time 12/07/2023 15.3 (H)  9.5 - 13.1 sec Final  . Prothrombin INR 12/07/2023 1.3 (H)  0.9 - 1.1 Final  . CRP (C-reactive Protein, Inflammat* 12/07/2023 2.80 (H)  <=0.85 mg/dL Final  . Sedimentation Rate-Automated 12/07/2023 25 (H)  <20 mm/hr Final  . POC Glucose 12/07/2023 123  70 - 140 mg/dL Final  . Vent Rate (bpm) 12/07/2023 75   Final  . PR Interval (msec) 12/07/2023 118   Final  . QRS Interval (msec) 12/07/2023 102   Final  . QT Interval (msec) 12/07/2023 392   Final  . QTc (msec) 12/07/2023 437   Final  . POC Glucose 12/08/2023 40 (LL)  70 - 140 mg/dL Final  . RALS Comment 90/96/7974 Provider Notified   Final  . POC Glucose 12/08/2023 60 (LL)  70 - 140 mg/dL Final  . RALS Comment 90/96/7974 Nurse Notified   Final  . POC Glucose 12/08/2023 83  70 - 140 mg/dL Final  . POC Glucose 90/96/7974 85  70 - 140 mg/dL Final  . POC Glucose 90/96/7974 34 (LL)  70 - 140 mg/dL Final  . RALS Comment 90/96/7974 Repeat Test   Final  . POC Glucose 12/08/2023 32 (LL)  70 - 140 mg/dL Final  . RALS Comment 90/96/7974 Treated Patient   Final  . Case Report 12/08/2023    Final                   Value:Surgical Pathology                                Case: [id number]                                Authorizing Provider:  Janece Norman Standing, MD  Collected:           12/08/2023 1518             Ordering Location:     [facility]          Received:            12/08/2023 1603             Pathologist:           Rulon Elsie Ade, MD  Specimen:    Finger, Right, Right Ring Finger                                                        . DIAGNOSIS 12/08/2023    Final                   Value:A. Right ring finger, amputation:  Finger with ulceration and gangrene.   Reviewed by resident/fellow KWON, GRACE   . Clinical Information 12/08/2023    Final                   Value:Dry gangrene (CMS/HHS-HCC)  . Gross Examination 12/08/2023    Final                   Value:A. Right ring finger, received fresh and placed in formalin on 2023-12-08 at 1610 is a 4.0 x 1.9 x 1.4 cm amputated digit. Two additional unoriented tissue fragments up to 2.0 cm are also received. The digit is notable for mummification at the tip, and a 1.1 x 1.0 cm area of ulceration immediately proximal to the nailbed. The resection bony and soft tissue margin appear grossly viable. The separate 2 fragments received are of additional bone and skin, demonstrating a smooth and glistening articular surface, and unremarkable skin.  BLOCK SUMMARY A1. Ulceration, representative, with viable soft tissue margin A2. Mummified skin, representative A3. Representative skin fragment A4. Representative bone fragment, following decalcification  Kwon GJ/Li H (2023-12-09)   . Microscopic Examination 12/08/2023    Final                   Value:Microscopic examination is performed.    Decalcification performed on block(s):  A4     . Additional Documentation 12/08/2023    Final                   Value:All immunohistochemistry, in situ hybridization tests and special stains performed at [facility] and reported herein were developed, validated and their performance characteristics determined by the [facility]. During the performance of these tests, appropriate positive and  negative control slides are also performed and reviewed. All control slides and internal controls (when applicable) demonstrate the expected immunoreactive patterns and/or nucleic acid hybridization. These ancillary studies were deemed medically necessary by the requesting pathologist. They were ordered following review of the H&E and clinical history except when part of a liver/kidney protocol or where clinical history (e.g. immunocompromised, critically ill, history of malignancy) clearly indicates. Some of the tests may not be cleared or approved by the U.S. Food and Drug Administration (FDA).  The FDA has determined that such clearance or approval is not necessary.  These tests                          are used for clinical purposes and should not be regarded as investigational or as research.  This laboratory is certified under the Clinical Laboratory Improvement Amendments of 1988 (CLIA) as qualified to perform high complexity clinical testing. At least one block in this case underwent decalcification during its processing. The vast majority of immunohistochemical and hybridization assays were not validated on decalcified tissues. The results were interpreted with caution given the possibility of false negative results on  decalcified specimens.   SABRA Ellison 12/08/2023    Final                   Value:All of the diagnostic evaluations on the enumerated specimens have been personally conducted by the pathologists involved in the care of this patient as indicated by the electronic signatures above.   SABRA POC Glucose 12/08/2023 133  70 - 140 mg/dL Final  . POC Glucose 90/96/7974 127  70 - 140 mg/dL Final  . Sodium 90/96/7974 136  135 - 145 mmol/L Final  . Potassium 12/08/2023 4.1  3.5 - 5.0 mmol/L Final  . Chloride 12/08/2023 98  98 - 108 mmol/L Final  . Carbon Dioxide (CO2) 12/08/2023 22  21 - 30 mmol/L Final  . Urea Nitrogen (BUN) 12/08/2023 61 (H)  7 - 20 mg/dL Final  . Creatinine 90/96/7974 12.6  (H)  0.6 - 1.3 mg/dL Final  . Calcium  12/08/2023 8.9  8.7 - 10.2 mg/dL Final  . Phosphorus 90/96/7974 6.4 (H)  2.3 - 4.5 mg/dL Final  . Albumin 90/96/7974 2.1 (L)  3.5 - 4.8 g/dL Final  . Glomerular Filtration Rate (eGFR)  12/08/2023 4  mL/min/1.73sq m Final  . Glucose 12/08/2023 111  70 - 140 mg/dL Final  . Anion Gap 90/96/7974 16 (H)  3 - 12 mmol/L Final  . BUN/CREA Ratio 12/08/2023 5 (L)  6 - 27 Final  . Cholesterol, Total 12/07/2023 47  <200 mg/dL Final  . LDL Calculated 12/07/2023 9  <100 mg/dL Final  . HDL 90/97/7974 22  mg/dL Final  . Triglyceride 90/97/7974 82  <150 mg/dL Final  . POC Glucose 90/96/7974 109  70 - 140 mg/dL Final  . Iron 90/96/7974 50  45 - 182 g/dL Final  . Total Iron Binding Capacity (TIBC) 12/08/2023 158 (L)  261 - 478 g/dL Final  . Percent Transferrin Saturation 12/08/2023 32  15 - 55 % Final  . Hepatitis B Surface Antigen 12/09/2023 NonReactive  NonReactive Final  . Hepatitis B Surface Antibody 12/09/2023 Negative  Negative Final  . Hepatitis B Surface Antibody Quant 12/09/2023 <3.00  mIU/mL Final  . Hepatitis C Virus Antibody 12/09/2023 NonReactive  NonReactive Final  . Sodium 12/09/2023 139  135 - 145 mmol/L Final  . Potassium 12/09/2023 4.1  3.5 - 5.0 mmol/L Final  . Chloride 12/09/2023 101  98 - 108 mmol/L Final  . Carbon Dioxide (CO2) 12/09/2023 22  21 - 30 mmol/L Final  . Urea Nitrogen (BUN) 12/09/2023 50 (H)  7 - 20 mg/dL Final  . Creatinine 90/95/7974 12.0 (H)  0.6 - 1.3 mg/dL Final  . Calcium  12/09/2023 9.1  8.7 - 10.2 mg/dL Final  . Phosphorus 90/95/7974 6.1 (H)  2.3 - 4.5 mg/dL Final  . Albumin 90/95/7974 2.3 (L)  3.5 - 4.8 g/dL Final  . Glomerular Filtration Rate (eGFR)  12/09/2023 4  mL/min/1.73sq m Final  . Glucose 12/09/2023 79  70 - 140 mg/dL Final  . Anion Gap 90/95/7974 16 (H)  3 - 12 mmol/L Final  . BUN/CREA Ratio 12/09/2023 4 (L)  6 - 27 Final  . Magnesium  12/09/2023 1.8  1.8 - 2.5 mg/dL Final  . WBC (White Blood Cell Count)  12/09/2023 12.8 (H)  3.2 - 9.8 x10^9/L Final  . Hemoglobin 12/09/2023 9.6 (L)  13.7 - 17.3 g/dL Final  . Hematocrit 90/95/7974 29.6 (L)  39.0 - 49.0 % Final  . Platelets 12/09/2023 165  150 - 450 x10^9/L Final  . MCV (Mean Corpuscular Volume)  12/09/2023 94  80 - 98 fL Final  . MCH (Mean Corpuscular Hemoglobin) 12/09/2023 30.5  26.5 - 34.0 pg Final  . MCHC (Mean Corpuscular Hemoglobin * 12/09/2023 32.4  31.5 - 36.3 % Final  . RBC (Red Blood Cell Count) 12/09/2023 3.15 (L)  4.37 - 5.74 x10^12/L Final  . RDW-CV (Red Cell Distribution Widt* 12/09/2023 14.0  11.5 - 14.5 % Final  . NRBC (Nucleated Red Blood Cell Cou* 12/09/2023 0.00  0 x10^9/L Final  . NRBC % (Nucleated Red Blood Cell %) 12/09/2023 0.0  % Final  . MPV (Mean Platelet Volume) 12/09/2023 10.1  7.2 - 11.7 fL Final  . Neutrophil Count 12/09/2023 9.8 (H)  2.0 - 8.6 x10^9/L Final  . Neutrophil % 12/09/2023 76.7  37 - 80 % Final  . Lymphocyte Count 12/09/2023 1.4  0.6 - 4.2 x10^9/L Final  . Lymphocyte % 12/09/2023 11.0  10 - 50 % Final  . Monocyte Count 12/09/2023 1.1 (H)  0 - 0.9 x10^9/L Final  . Monocyte % 12/09/2023 8.3  0 - 12 % Final  . Eosinophil Count 12/09/2023 0.32  0 - 0.70 x10^9/L Final  . Eosinophil % 12/09/2023 2.5  0 - 7 % Final  . Basophil Count 12/09/2023 0.04  0 - 0.20 x10^9/L Final  . Basophil % 12/09/2023 0.3  0 - 2 % Final  . Immature Granulocyte Count 12/09/2023 0.15 (H)  <=0.06 x10^9/L Final  . Immature Granulocyte % 12/09/2023 1.2 (H)  <=0.7 % Final  . POC Glucose 12/09/2023 60 (LL)  70 - 140 mg/dL Final  . RALS Comment 90/95/7974 Nurse Notified   Final  . POC Glucose 12/09/2023 89  70 - 140 mg/dL Final  . Hemoglobin J8R 12/09/2023 5.8 (H)  <5.7 % Final  . Average Blood Glucose (Calculated * 12/09/2023 118  mg/dL Final  . LV Ejection Fraction (%) 12/09/2023 55  % Final  . Right Ventricle Systolic Pressure * 12/09/2023 26  mmHg Final  . Left Atrium Diameter (cm) 12/09/2023 4.3  cm Final  . LV End Diastolic  Diameter (cm) 12/09/2023 4.9  cm Final  . LV End Systolic Diameter (cm) 12/09/2023 3.4  cm Final  . LV Septum Wall Thickness (cm) 12/09/2023 1.1  cm Final  . LV Posterior Wall Thickness (cm) 12/09/2023 1.0  cm Final  . Tricuspid Valve Regurgitation Grade 12/09/2023 trivial   Final  . Tricuspid Valve Regurgitation Max * 12/09/2023 2.4  m/s Final  . Mitral Valve Regurgitation Grade 12/09/2023 trivial   Final  . Mitral Valve Stenosis Grade 12/09/2023 none   Final  . Aortic Valve Regurgitation Grade 12/09/2023 trivial   Final  . Aortic Valve Stenosis Grade 12/09/2023 none   Final  . Aortic Valve Stenosis Mean Gradien* 12/09/2023 12  mmHg Final  . Aortic Valve Max Velocity (m/s) 12/09/2023 2.3  m/s Final  . POC Glucose 12/09/2023 167 (H)  70 - 140 mg/dL Final  . POC Glucose 90/95/7974 174 (H)  70 - 140 mg/dL Final  . 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 Bp has been lower at home, very dizzy when up now. Not eating much, in some pain but only taking 2 percocets a day at this point. Ultimately advised er with hypotension but he would not go, he  is understanding he is very near end of life. He and his wife will dc the coreg  and torsemide . Advised a bottle of gatorade.    ESRD needing dialysis (CMS/HHS-HCC)    Discussed with his home health nurse who is going to check on him and help get him in the house.

## 2024-04-06 DEATH — deceased

## 2024-07-05 ENCOUNTER — Ambulatory Visit: Admitting: Dermatology

## 2024-07-11 ENCOUNTER — Ambulatory Visit: Admitting: Dermatology
